# Patient Record
Sex: Female | Born: 1940 | Race: Black or African American | Hispanic: No | Marital: Married | Smoking: Never smoker
Health system: Southern US, Community
[De-identification: ages and names within clinical notes are randomized; demographics above are authoritative.]

## PROBLEM LIST (undated history)

## (undated) DIAGNOSIS — G4730 Sleep apnea, unspecified: Secondary | ICD-10-CM

## (undated) DIAGNOSIS — D860 Sarcoidosis of lung: Secondary | ICD-10-CM

## (undated) DIAGNOSIS — Z87442 Personal history of urinary calculi: Secondary | ICD-10-CM

## (undated) DIAGNOSIS — I10 Essential (primary) hypertension: Secondary | ICD-10-CM

## (undated) DIAGNOSIS — E785 Hyperlipidemia, unspecified: Secondary | ICD-10-CM

## (undated) HISTORY — DX: Hyperlipidemia, unspecified: E78.5

## (undated) HISTORY — PX: BREAST EXCISIONAL BIOPSY: SUR124

## (undated) HISTORY — PX: TONSILLECTOMY: SUR1361

## (undated) HISTORY — DX: Essential (primary) hypertension: I10

---

## 1997-06-27 ENCOUNTER — Ambulatory Visit (HOSPITAL_COMMUNITY): Admission: RE | Admit: 1997-06-27 | Discharge: 1997-06-27 | Payer: Self-pay | Admitting: Internal Medicine

## 1997-08-08 ENCOUNTER — Ambulatory Visit (HOSPITAL_COMMUNITY): Admission: RE | Admit: 1997-08-08 | Discharge: 1997-08-08 | Payer: Self-pay | Admitting: Internal Medicine

## 1997-11-26 ENCOUNTER — Ambulatory Visit (HOSPITAL_COMMUNITY): Admission: RE | Admit: 1997-11-26 | Discharge: 1997-11-26 | Payer: Self-pay | Admitting: Internal Medicine

## 1997-12-20 ENCOUNTER — Other Ambulatory Visit: Admission: RE | Admit: 1997-12-20 | Discharge: 1997-12-20 | Payer: Self-pay | Admitting: Obstetrics and Gynecology

## 1998-01-25 ENCOUNTER — Emergency Department (HOSPITAL_COMMUNITY): Admission: EM | Admit: 1998-01-25 | Discharge: 1998-01-25 | Payer: Self-pay | Admitting: Emergency Medicine

## 1998-01-25 ENCOUNTER — Encounter: Payer: Self-pay | Admitting: Emergency Medicine

## 1998-12-31 ENCOUNTER — Other Ambulatory Visit: Admission: RE | Admit: 1998-12-31 | Discharge: 1998-12-31 | Payer: Self-pay | Admitting: Obstetrics and Gynecology

## 1999-05-27 ENCOUNTER — Ambulatory Visit (HOSPITAL_COMMUNITY): Admission: RE | Admit: 1999-05-27 | Discharge: 1999-05-27 | Payer: Self-pay | Admitting: Gastroenterology

## 2000-01-13 ENCOUNTER — Other Ambulatory Visit: Admission: RE | Admit: 2000-01-13 | Discharge: 2000-01-13 | Payer: Self-pay | Admitting: Obstetrics and Gynecology

## 2001-02-14 ENCOUNTER — Other Ambulatory Visit: Admission: RE | Admit: 2001-02-14 | Discharge: 2001-02-14 | Payer: Self-pay | Admitting: Obstetrics and Gynecology

## 2002-02-20 ENCOUNTER — Other Ambulatory Visit: Admission: RE | Admit: 2002-02-20 | Discharge: 2002-02-20 | Payer: Self-pay | Admitting: Obstetrics and Gynecology

## 2004-05-06 ENCOUNTER — Other Ambulatory Visit: Admission: RE | Admit: 2004-05-06 | Discharge: 2004-05-06 | Payer: Self-pay | Admitting: Obstetrics and Gynecology

## 2005-05-11 ENCOUNTER — Other Ambulatory Visit: Admission: RE | Admit: 2005-05-11 | Discharge: 2005-05-11 | Payer: Self-pay | Admitting: Obstetrics and Gynecology

## 2013-03-20 ENCOUNTER — Ambulatory Visit
Admission: RE | Admit: 2013-03-20 | Discharge: 2013-03-20 | Disposition: A | Discharge status: Home / Self Care | Payer: BC Managed Care – PPO | Source: Ambulatory Visit | Attending: Obstetrics and Gynecology | Admitting: Obstetrics and Gynecology

## 2013-03-20 ENCOUNTER — Other Ambulatory Visit: Payer: Self-pay | Admitting: Obstetrics and Gynecology

## 2013-03-20 DIAGNOSIS — M810 Age-related osteoporosis without current pathological fracture: Secondary | ICD-10-CM

## 2018-10-26 ENCOUNTER — Other Ambulatory Visit: Payer: Self-pay | Admitting: Cardiology

## 2018-10-26 DIAGNOSIS — Z20822 Contact with and (suspected) exposure to covid-19: Secondary | ICD-10-CM

## 2018-10-28 LAB — NOVEL CORONAVIRUS, NAA: SARS-CoV-2, NAA: NOT DETECTED

## 2018-12-07 ENCOUNTER — Other Ambulatory Visit: Payer: Self-pay | Admitting: Cardiology

## 2018-12-07 DIAGNOSIS — Z20822 Contact with and (suspected) exposure to covid-19: Secondary | ICD-10-CM

## 2018-12-10 LAB — NOVEL CORONAVIRUS, NAA: SARS-CoV-2, NAA: NOT DETECTED

## 2019-05-05 ENCOUNTER — Encounter (HOSPITAL_COMMUNITY): Payer: Self-pay

## 2019-05-05 ENCOUNTER — Emergency Department (HOSPITAL_COMMUNITY): Payer: Medicare Other

## 2019-05-05 ENCOUNTER — Other Ambulatory Visit: Payer: Self-pay

## 2019-05-05 ENCOUNTER — Inpatient Hospital Stay (HOSPITAL_COMMUNITY)
Admission: EM | Admit: 2019-05-05 | Discharge: 2019-05-10 | DRG: 854 | Disposition: A | Discharge status: Home Health | Payer: Medicare Other | Attending: Internal Medicine | Admitting: Internal Medicine

## 2019-05-05 DIAGNOSIS — N10 Acute tubulo-interstitial nephritis: Secondary | ICD-10-CM | POA: Diagnosis present

## 2019-05-05 DIAGNOSIS — Z713 Dietary counseling and surveillance: Secondary | ICD-10-CM

## 2019-05-05 DIAGNOSIS — Z79899 Other long term (current) drug therapy: Secondary | ICD-10-CM

## 2019-05-05 DIAGNOSIS — E872 Acidosis, unspecified: Secondary | ICD-10-CM | POA: Diagnosis present

## 2019-05-05 DIAGNOSIS — N136 Pyonephrosis: Secondary | ICD-10-CM | POA: Diagnosis present

## 2019-05-05 DIAGNOSIS — Z7984 Long term (current) use of oral hypoglycemic drugs: Secondary | ICD-10-CM

## 2019-05-05 DIAGNOSIS — A419 Sepsis, unspecified organism: Secondary | ICD-10-CM

## 2019-05-05 DIAGNOSIS — A4150 Gram-negative sepsis, unspecified: Secondary | ICD-10-CM | POA: Diagnosis not present

## 2019-05-05 DIAGNOSIS — D860 Sarcoidosis of lung: Secondary | ICD-10-CM | POA: Diagnosis present

## 2019-05-05 DIAGNOSIS — E669 Obesity, unspecified: Secondary | ICD-10-CM | POA: Diagnosis present

## 2019-05-05 DIAGNOSIS — N281 Cyst of kidney, acquired: Secondary | ICD-10-CM | POA: Diagnosis present

## 2019-05-05 DIAGNOSIS — N390 Urinary tract infection, site not specified: Secondary | ICD-10-CM | POA: Diagnosis present

## 2019-05-05 DIAGNOSIS — R7401 Elevation of levels of liver transaminase levels: Secondary | ICD-10-CM | POA: Diagnosis present

## 2019-05-05 DIAGNOSIS — R7881 Bacteremia: Secondary | ICD-10-CM | POA: Diagnosis present

## 2019-05-05 DIAGNOSIS — E8770 Fluid overload, unspecified: Secondary | ICD-10-CM | POA: Diagnosis not present

## 2019-05-05 DIAGNOSIS — R6520 Severe sepsis without septic shock: Secondary | ICD-10-CM | POA: Diagnosis present

## 2019-05-05 DIAGNOSIS — K130 Diseases of lips: Secondary | ICD-10-CM | POA: Diagnosis not present

## 2019-05-05 DIAGNOSIS — E538 Deficiency of other specified B group vitamins: Secondary | ICD-10-CM | POA: Diagnosis present

## 2019-05-05 DIAGNOSIS — Z6839 Body mass index (BMI) 39.0-39.9, adult: Secondary | ICD-10-CM

## 2019-05-05 DIAGNOSIS — N1330 Unspecified hydronephrosis: Secondary | ICD-10-CM | POA: Diagnosis present

## 2019-05-05 DIAGNOSIS — I10 Essential (primary) hypertension: Secondary | ICD-10-CM | POA: Diagnosis present

## 2019-05-05 DIAGNOSIS — N201 Calculus of ureter: Secondary | ICD-10-CM | POA: Diagnosis present

## 2019-05-05 DIAGNOSIS — Z20822 Contact with and (suspected) exposure to covid-19: Secondary | ICD-10-CM | POA: Diagnosis present

## 2019-05-05 DIAGNOSIS — N179 Acute kidney failure, unspecified: Secondary | ICD-10-CM | POA: Diagnosis present

## 2019-05-05 DIAGNOSIS — D509 Iron deficiency anemia, unspecified: Secondary | ICD-10-CM | POA: Diagnosis present

## 2019-05-05 DIAGNOSIS — E876 Hypokalemia: Secondary | ICD-10-CM | POA: Diagnosis present

## 2019-05-05 DIAGNOSIS — E860 Dehydration: Secondary | ICD-10-CM | POA: Diagnosis present

## 2019-05-05 DIAGNOSIS — B9620 Unspecified Escherichia coli [E. coli] as the cause of diseases classified elsewhere: Secondary | ICD-10-CM | POA: Diagnosis present

## 2019-05-05 DIAGNOSIS — E1165 Type 2 diabetes mellitus with hyperglycemia: Secondary | ICD-10-CM | POA: Diagnosis present

## 2019-05-05 DIAGNOSIS — D3501 Benign neoplasm of right adrenal gland: Secondary | ICD-10-CM | POA: Diagnosis present

## 2019-05-05 HISTORY — DX: Sarcoidosis of lung: D86.0

## 2019-05-05 LAB — CBC WITH DIFFERENTIAL/PLATELET
Abs Immature Granulocytes: 0 10*3/uL (ref 0.00–0.07)
Band Neutrophils: 12 %
Basophils Absolute: 0 10*3/uL (ref 0.0–0.1)
Basophils Relative: 0 %
Eosinophils Absolute: 0.1 10*3/uL (ref 0.0–0.5)
Eosinophils Relative: 3 %
HCT: 31.9 % — ABNORMAL LOW (ref 36.0–46.0)
Hemoglobin: 10.4 g/dL — ABNORMAL LOW (ref 12.0–15.0)
Lymphocytes Relative: 5 %
Lymphs Abs: 0.2 10*3/uL — ABNORMAL LOW (ref 0.7–4.0)
MCH: 28.8 pg (ref 26.0–34.0)
MCHC: 32.6 g/dL (ref 30.0–36.0)
MCV: 88.4 fL (ref 80.0–100.0)
Monocytes Absolute: 0 10*3/uL — ABNORMAL LOW (ref 0.1–1.0)
Monocytes Relative: 1 %
Neutro Abs: 3.5 10*3/uL (ref 1.7–7.7)
Neutrophils Relative %: 79 %
Platelets: 295 10*3/uL (ref 150–400)
RBC: 3.61 MIL/uL — ABNORMAL LOW (ref 3.87–5.11)
RDW: 14.4 % (ref 11.5–15.5)
WBC: 3.9 10*3/uL — ABNORMAL LOW (ref 4.0–10.5)
nRBC: 0 % (ref 0.0–0.2)

## 2019-05-05 LAB — PROTIME-INR
INR: 1.1 (ref 0.8–1.2)
Prothrombin Time: 13.6 seconds (ref 11.4–15.2)

## 2019-05-05 LAB — COMPREHENSIVE METABOLIC PANEL
ALT: 60 U/L — ABNORMAL HIGH (ref 0–44)
AST: 117 U/L — ABNORMAL HIGH (ref 15–41)
Albumin: 3.8 g/dL (ref 3.5–5.0)
Alkaline Phosphatase: 102 U/L (ref 38–126)
Anion gap: 13 (ref 5–15)
BUN: 40 mg/dL — ABNORMAL HIGH (ref 8–23)
CO2: 20 mmol/L — ABNORMAL LOW (ref 22–32)
Calcium: 9.4 mg/dL (ref 8.9–10.3)
Chloride: 103 mmol/L (ref 98–111)
Creatinine, Ser: 2.64 mg/dL — ABNORMAL HIGH (ref 0.44–1.00)
GFR calc Af Amer: 20 mL/min — ABNORMAL LOW (ref >60)
GFR calc non Af Amer: 17 mL/min — ABNORMAL LOW (ref >60)
Glucose, Bld: 253 mg/dL — ABNORMAL HIGH (ref 70–99)
Potassium: 3.4 mmol/L — ABNORMAL LOW (ref 3.5–5.1)
Sodium: 136 mmol/L (ref 135–145)
Total Bilirubin: 0.7 mg/dL (ref 0.3–1.2)
Total Protein: 6.5 g/dL (ref 6.5–8.1)

## 2019-05-05 LAB — APTT: aPTT: 27 seconds (ref 24–36)

## 2019-05-05 LAB — LACTIC ACID, PLASMA: Lactic Acid, Venous: 6.3 mmol/L (ref 0.5–1.9)

## 2019-05-05 MED ORDER — ACETAMINOPHEN 500 MG PO TABS
1000.0000 mg | ORAL_TABLET | Freq: Once | ORAL | Status: AC
  Administered 2019-05-05: 1000 mg via ORAL
  Filled 2019-05-05: qty 2

## 2019-05-05 MED ORDER — SODIUM CHLORIDE 0.9 % IV BOLUS
1000.0000 mL | Freq: Once | INTRAVENOUS | Status: AC
  Administered 2019-05-05: 1000 mL via INTRAVENOUS

## 2019-05-05 MED ORDER — SODIUM CHLORIDE 0.9 % IV SOLN
500.0000 mg | INTRAVENOUS | Status: DC
  Administered 2019-05-05: 500 mg via INTRAVENOUS
  Filled 2019-05-05: qty 500

## 2019-05-05 MED ORDER — SODIUM CHLORIDE 0.9 % IV SOLN
2.0000 g | INTRAVENOUS | Status: DC
  Administered 2019-05-05: 2 g via INTRAVENOUS
  Filled 2019-05-05: qty 20

## 2019-05-05 MED ORDER — SODIUM CHLORIDE 0.9 % IV BOLUS: 1000.0000 mL | Freq: Once | INTRAVENOUS | Status: DC

## 2019-05-05 MED ORDER — SODIUM CHLORIDE 0.9 % IV BOLUS
2000.0000 mL | Freq: Once | INTRAVENOUS | Status: AC
  Administered 2019-05-05: 2000 mL via INTRAVENOUS

## 2019-05-05 NOTE — ED Triage Notes (Signed)
Per EMS, Pt is coming from home. Pt called out today when she began having lethargy, and chills. 102.0 temp on scene, pt took 4x baby Asprin at 2012. Denies any pain, and being around anyone who is sick. Pt has also had both covid vaccines.

## 2019-05-05 NOTE — ED Provider Notes (Signed)
[facility] DEPT Provider Note   CSN: [id number] Arrival date & time: 05/05/19  2114     History Chief Complaint  Patient presents with  . Fever    Diane Wilkerson is a 79 y.o. female.  The history is provided by the patient.  Illness Location:  General Quality:  Fatigue Severity:  Mild Onset quality:  Gradual Timing:  Constant Progression:  Unchanged Chronicity:  New Context:  Fatigue all day today. Cough but mostly that is chronic. Fever with EMS. Denies pain. Covid vaccinated already. No abdominal pain, throat pain, UTI symtpms. Relieved by:  Nothing Worsened by:  Nothing Associated symptoms: fatigue and fever   Associated symptoms: no abdominal pain, no chest pain, no congestion, no cough, no ear pain, no rash, no shortness of breath, no sore throat and no vomiting        History reviewed. No pertinent past medical history.  There are no problems to display for this patient.   History reviewed. No pertinent surgical history.   OB History   No obstetric history on file.     No family history on file.  Social History   Tobacco Use  . Smoking status: Not on file  Substance Use Topics  . Alcohol use: Not on file  . Drug use: Not on file    Home Medications Prior to Admission medications   Medication Sig Start Date End Date Taking? Authorizing Provider  glipizide-metformin (METAGLIP) 2.5-250 MG tablet Take 1 tablet by mouth 2 (two) times daily. 04/21/19  Yes [provider]  losartan-hydrochlorothiazide (HYZAAR) 100-25 MG tablet Take 1 tablet by mouth daily. 03/26/19  Yes [provider]    Allergies    Patient has no known allergies.  Review of Systems   Review of Systems  Constitutional: Positive for fatigue and fever. Negative for chills.  HENT: Negative for congestion, ear pain and sore throat.   Eyes: Negative for pain and visual disturbance.  Respiratory: Negative for cough and shortness of  breath.   Cardiovascular: Negative for chest pain and palpitations.  Gastrointestinal: Negative for abdominal pain and vomiting.  Genitourinary: Negative for dysuria and hematuria.  Musculoskeletal: Negative for arthralgias and back pain.  Skin: Negative for color change and rash.  Neurological: Negative for seizures and syncope.  All other systems reviewed and are negative.   Physical Exam Updated Vital Signs  ED Triage Vitals  Enc Vitals Group     BP 05/05/19 2133 131/64     Pulse Rate 05/05/19 2133 (!) 137     Resp 05/05/19 2133 (!) 28     Temp 05/05/19 2133 (!) 103.1 F (39.5 C)     Temp Source 05/05/19 2133 Oral     SpO2 05/05/19 2127 97 %     Weight 05/05/19 2134 190 lb (86.2 kg)     Height 05/05/19 2134 5\' 2"  (1.575 m)     Head Circumference --      Peak Flow --      Pain Score 05/05/19 2133 3     Pain Loc --      Pain Edu? --      Excl. in [place]? --     Physical Exam Vitals and nursing note reviewed.  Constitutional:      General: She is not in acute distress.    Appearance: She is well-developed. She is ill-appearing.  HENT:     Head: Normocephalic and atraumatic.     Mouth/Throat:     Mouth:  Mucous membranes are moist.  Eyes:     Extraocular Movements: Extraocular movements intact.     Conjunctiva/sclera: Conjunctivae normal.     Pupils: Pupils are equal, round, and reactive to light.  Cardiovascular:     Rate and Rhythm: Regular rhythm. Tachycardia present.     Pulses: Normal pulses.     Heart sounds: Normal heart sounds. No murmur.  Pulmonary:     Effort: Pulmonary effort is normal. No respiratory distress.     Breath sounds: Normal breath sounds.  Abdominal:     General: Abdomen is flat. There is no distension.     Palpations: Abdomen is soft.     Tenderness: There is no abdominal tenderness.  Musculoskeletal:     Cervical back: Neck supple.  Skin:    General: Skin is warm and dry.     Capillary Refill: Capillary refill takes less than 2 seconds.    Neurological:     General: No focal deficit present.     Mental Status: She is alert.     ED Results / Procedures / Treatments   Labs (all labs ordered are listed, but only abnormal results are displayed) Labs Reviewed  COMPREHENSIVE METABOLIC PANEL - Abnormal; Notable for the following components:      Result Value   Potassium 3.4 (*)    CO2 20 (*)    Glucose, Bld 253 (*)    BUN 40 (*)    Creatinine, Ser 2.64 (*)    AST 117 (*)    ALT 60 (*)    GFR calc non Af Amer 17 (*)    GFR calc Af Amer 20 (*)    All other components within normal limits  LACTIC ACID, PLASMA - Abnormal; Notable for the following components:   Lactic Acid, Venous 6.3 (*)    All other components within normal limits  CBC WITH DIFFERENTIAL/PLATELET - Abnormal; Notable for the following components:   WBC 3.9 (*)    RBC 3.61 (*)    Hemoglobin 10.4 (*)    HCT 31.9 (*)    Lymphs Abs 0.2 (*)    Monocytes Absolute 0.0 (*)    All other components within normal limits  CULTURE, BLOOD (ROUTINE X 2)  CULTURE, BLOOD (ROUTINE X 2)  URINE CULTURE  RESPIRATORY PANEL BY RT PCR (FLU A&B, COVID)  PROTIME-INR  APTT  LACTIC ACID, PLASMA  URINALYSIS, ROUTINE W REFLEX MICROSCOPIC    EKG EKG Interpretation  Date/Time:  Saturday May 05 2019 21:30:24 EDT Ventricular Rate:  139 PR Interval:    QRS Duration: 73 QT Interval:  299 QTC Calculation: 455 R Axis:   69 Text Interpretation: Sinus tachycardia Low voltage, precordial leads Confirmed by Lennice Sites [phone]) on 05/05/2019 10:47:12 PM   Radiology DG Chest 2 View  Result Date: 05/05/2019 CLINICAL DATA:  Lethargy and chills. EXAM: CHEST - 2 VIEW COMPARISON:  None. FINDINGS: Very mild, diffusely increased lung markings are seen without evidence of acute infiltrate, pleural effusion or pneumothorax. The heart size and mediastinal contours are within normal limits. Very mild prominence of the right hilum is seen. The visualized skeletal structures are  unremarkable. IMPRESSION: 1. Very mild, diffusely increased lung markings which are likely chronic in nature. 2. Mild prominence of the right hilum which may be vascular in origin. Mild lymphadenopathy cannot be excluded. Electronically Signed   By: Virgina Norfolk M.D.   On: 05/05/2019 22:32    Procedures .Critical Care Performed by: Lennice Sites, DO Authorized by: Lennice Sites,  DO   Critical care provider statement:    Critical care time (minutes):  35   Critical care was necessary to treat or prevent imminent or life-threatening deterioration of the following conditions:  Sepsis   Critical care was time spent personally by me on the following activities:  Blood draw for specimens, discussions with primary provider, evaluation of patient's response to treatment, development of treatment plan with patient or surrogate, examination of patient, obtaining history from patient or surrogate, ordering and performing treatments and interventions, ordering and review of laboratory studies, ordering and review of radiographic studies, pulse oximetry, re-evaluation of patient's condition and review of old charts   I assumed direction of critical care for this patient from another provider in my specialty: no     (including critical care time)  Medications Ordered in ED Medications  cefTRIAXone (ROCEPHIN) 2 g in sodium chloride 0.9 % 100 mL IVPB (0 g Intravenous Stopped 05/05/19 2314)  azithromycin (ZITHROMAX) 500 mg in sodium chloride 0.9 % 250 mL IVPB (500 mg Intravenous New Bag/Given 05/05/19 2314)  sodium chloride 0.9 % bolus 1,000 mL (has no administration in time range)  acetaminophen (TYLENOL) tablet 1,000 mg (1,000 mg Oral Given 05/05/19 2217)  sodium chloride 0.9 % bolus 2,000 mL (2,000 mLs Intravenous New Bag/Given 05/05/19 2219)    ED Course  I have reviewed the triage vital signs and the nursing notes.  Pertinent labs & imaging results that were available during my care of the patient  were reviewed by me and considered in my medical decision making (see chart for details).    MDM Rules/Calculators/A&P                      Diane Wilkerson is a 79 year old female with no significant medical history presents the ED with fatigue.  Patient found to be febrile and tachycardic upon arrival.  Code sepsis initiated.  She has had a cough but denies any other specific symptoms.  No pain with urination, no abdominal pain, no shortness of breath, no chest pain.  Has been vaccinated for coronavirus.  Will give IV fluids, Tylenol, IV antibiotics and evaluate for sepsis.  Patient with creatinine of 2.64, lactic acid of 6.3.  White count of 3.9.  Otherwise lab work is fairly unremarkable.  He does not show obvious infection.  Awaiting urinalysis.  30 cc/kg IV fluids have now been ordered given lactic acid of 6.3.  Will obtain a CT scan of the chest abdomen pelvis to further evaluate.  Patient signed out to oncoming ED staff with patient pending imaging, urinalysis.  Patient otherwise improving hemodynamically following IV fluids.  Please see Dr. Vevelyn Francois note for further results, evaluation, disposition of the patient.  This chart was dictated using voice recognition software.  Despite best efforts to proofread,  errors can occur which can change the documentation meaning.     Final Clinical Impression(s) / ED Diagnoses Final diagnoses:  Sepsis, due to unspecified organism, unspecified whether acute organ dysfunction present [facility])  AKI (acute kidney injury) [facility])    Rx / [facility] Orders ED Discharge Orders    None       Lennice Sites, DO 05/05/19 2338

## 2019-05-06 ENCOUNTER — Encounter (HOSPITAL_COMMUNITY): Payer: Self-pay | Admitting: Internal Medicine

## 2019-05-06 ENCOUNTER — Inpatient Hospital Stay (HOSPITAL_COMMUNITY): Payer: Medicare Other | Admitting: Certified Registered"

## 2019-05-06 ENCOUNTER — Encounter (HOSPITAL_COMMUNITY)
Admission: EM | Disposition: A | Discharge status: Home Health | Payer: Self-pay | Source: Home / Self Care | Attending: Internal Medicine

## 2019-05-06 ENCOUNTER — Inpatient Hospital Stay (HOSPITAL_COMMUNITY): Payer: Medicare Other

## 2019-05-06 DIAGNOSIS — D860 Sarcoidosis of lung: Secondary | ICD-10-CM | POA: Diagnosis present

## 2019-05-06 DIAGNOSIS — D509 Iron deficiency anemia, unspecified: Secondary | ICD-10-CM | POA: Diagnosis not present

## 2019-05-06 DIAGNOSIS — N179 Acute kidney failure, unspecified: Secondary | ICD-10-CM | POA: Diagnosis not present

## 2019-05-06 DIAGNOSIS — E876 Hypokalemia: Secondary | ICD-10-CM | POA: Diagnosis present

## 2019-05-06 DIAGNOSIS — D3501 Benign neoplasm of right adrenal gland: Secondary | ICD-10-CM | POA: Diagnosis present

## 2019-05-06 DIAGNOSIS — R6520 Severe sepsis without septic shock: Secondary | ICD-10-CM

## 2019-05-06 DIAGNOSIS — A419 Sepsis, unspecified organism: Secondary | ICD-10-CM

## 2019-05-06 DIAGNOSIS — R7401 Elevation of levels of liver transaminase levels: Secondary | ICD-10-CM | POA: Diagnosis not present

## 2019-05-06 DIAGNOSIS — E872 Acidosis, unspecified: Secondary | ICD-10-CM | POA: Diagnosis present

## 2019-05-06 DIAGNOSIS — E860 Dehydration: Secondary | ICD-10-CM | POA: Diagnosis present

## 2019-05-06 DIAGNOSIS — E669 Obesity, unspecified: Secondary | ICD-10-CM | POA: Diagnosis not present

## 2019-05-06 DIAGNOSIS — E1165 Type 2 diabetes mellitus with hyperglycemia: Secondary | ICD-10-CM

## 2019-05-06 DIAGNOSIS — N201 Calculus of ureter: Secondary | ICD-10-CM | POA: Diagnosis present

## 2019-05-06 DIAGNOSIS — I10 Essential (primary) hypertension: Secondary | ICD-10-CM | POA: Diagnosis present

## 2019-05-06 DIAGNOSIS — Z713 Dietary counseling and surveillance: Secondary | ICD-10-CM | POA: Diagnosis not present

## 2019-05-06 DIAGNOSIS — N10 Acute tubulo-interstitial nephritis: Secondary | ICD-10-CM | POA: Diagnosis not present

## 2019-05-06 DIAGNOSIS — A4150 Gram-negative sepsis, unspecified: Secondary | ICD-10-CM | POA: Diagnosis present

## 2019-05-06 DIAGNOSIS — E538 Deficiency of other specified B group vitamins: Secondary | ICD-10-CM | POA: Diagnosis not present

## 2019-05-06 DIAGNOSIS — Z7984 Long term (current) use of oral hypoglycemic drugs: Secondary | ICD-10-CM | POA: Diagnosis not present

## 2019-05-06 DIAGNOSIS — Z6839 Body mass index (BMI) 39.0-39.9, adult: Secondary | ICD-10-CM | POA: Diagnosis not present

## 2019-05-06 DIAGNOSIS — N136 Pyonephrosis: Secondary | ICD-10-CM | POA: Diagnosis not present

## 2019-05-06 DIAGNOSIS — E8770 Fluid overload, unspecified: Secondary | ICD-10-CM | POA: Diagnosis not present

## 2019-05-06 DIAGNOSIS — Z20822 Contact with and (suspected) exposure to covid-19: Secondary | ICD-10-CM | POA: Diagnosis not present

## 2019-05-06 DIAGNOSIS — K130 Diseases of lips: Secondary | ICD-10-CM | POA: Diagnosis not present

## 2019-05-06 DIAGNOSIS — N281 Cyst of kidney, acquired: Secondary | ICD-10-CM | POA: Diagnosis present

## 2019-05-06 DIAGNOSIS — Z79899 Other long term (current) drug therapy: Secondary | ICD-10-CM | POA: Diagnosis not present

## 2019-05-06 HISTORY — DX: Type 2 diabetes mellitus with hyperglycemia: E11.65

## 2019-05-06 HISTORY — PX: CYSTOSCOPY WITH STENT PLACEMENT: SHX5790

## 2019-05-06 HISTORY — DX: Essential (primary) hypertension: I10

## 2019-05-06 LAB — MRSA PCR SCREENING: MRSA by PCR: NEGATIVE

## 2019-05-06 LAB — BLOOD CULTURE ID PANEL (REFLEXED)

## 2019-05-06 LAB — URINALYSIS, ROUTINE W REFLEX MICROSCOPIC
Bilirubin Urine: NEGATIVE
Glucose, UA: NEGATIVE mg/dL
Ketones, ur: 5 mg/dL — AB
Nitrite: NEGATIVE
Protein, ur: 100 mg/dL — AB
RBC / HPF: >50 RBC/hpf — ABNORMAL HIGH (ref 0–5)
Specific Gravity, Urine: 1.015 (ref 1.005–1.030)
pH: 5 (ref 5.0–8.0)

## 2019-05-06 LAB — CBC WITH DIFFERENTIAL/PLATELET
Abs Immature Granulocytes: 0.39 10*3/uL — ABNORMAL HIGH (ref 0.00–0.07)
Basophils Absolute: 0 10*3/uL (ref 0.0–0.1)
Basophils Relative: 0 %
Eosinophils Absolute: 0 10*3/uL (ref 0.0–0.5)
Eosinophils Relative: 0 %
HCT: 28.7 % — ABNORMAL LOW (ref 36.0–46.0)
Hemoglobin: 8.9 g/dL — ABNORMAL LOW (ref 12.0–15.0)
Immature Granulocytes: 2 %
Lymphocytes Relative: 2 %
Lymphs Abs: 0.4 10*3/uL — ABNORMAL LOW (ref 0.7–4.0)
MCH: 28.2 pg (ref 26.0–34.0)
MCHC: 31 g/dL (ref 30.0–36.0)
MCV: 90.8 fL (ref 80.0–100.0)
Monocytes Absolute: 1 10*3/uL (ref 0.1–1.0)
Monocytes Relative: 6 %
Neutro Abs: 15.1 10*3/uL — ABNORMAL HIGH (ref 1.7–7.7)
Neutrophils Relative %: 90 %
Platelets: 239 10*3/uL (ref 150–400)
RBC: 3.16 MIL/uL — ABNORMAL LOW (ref 3.87–5.11)
RDW: 14.7 % (ref 11.5–15.5)
WBC: 16.9 10*3/uL — ABNORMAL HIGH (ref 4.0–10.5)
nRBC: 0 % (ref 0.0–0.2)

## 2019-05-06 LAB — RETICULOCYTES
Immature Retic Fract: 14.4 % (ref 2.3–15.9)
RBC.: 3.08 MIL/uL — ABNORMAL LOW (ref 3.87–5.11)
Retic Count, Absolute: 47.1 10*3/uL (ref 19.0–186.0)
Retic Ct Pct: 1.5 % (ref 0.4–3.1)

## 2019-05-06 LAB — MAGNESIUM: Magnesium: 1.4 mg/dL — ABNORMAL LOW (ref 1.7–2.4)

## 2019-05-06 LAB — COMPREHENSIVE METABOLIC PANEL
ALT: 60 U/L — ABNORMAL HIGH (ref 0–44)
AST: 95 U/L — ABNORMAL HIGH (ref 15–41)
Albumin: 3.2 g/dL — ABNORMAL LOW (ref 3.5–5.0)
Alkaline Phosphatase: 53 U/L (ref 38–126)
Anion gap: 8 (ref 5–15)
BUN: 41 mg/dL — ABNORMAL HIGH (ref 8–23)
CO2: 22 mmol/L (ref 22–32)
Calcium: 8.3 mg/dL — ABNORMAL LOW (ref 8.9–10.3)
Chloride: 112 mmol/L — ABNORMAL HIGH (ref 98–111)
Creatinine, Ser: 2.67 mg/dL — ABNORMAL HIGH (ref 0.44–1.00)
GFR calc Af Amer: 19 mL/min — ABNORMAL LOW (ref >60)
GFR calc non Af Amer: 17 mL/min — ABNORMAL LOW (ref >60)
Glucose, Bld: 167 mg/dL — ABNORMAL HIGH (ref 70–99)
Potassium: 4 mmol/L (ref 3.5–5.1)
Sodium: 142 mmol/L (ref 135–145)
Total Bilirubin: 0.6 mg/dL (ref 0.3–1.2)
Total Protein: 5.8 g/dL — ABNORMAL LOW (ref 6.5–8.1)

## 2019-05-06 LAB — LACTIC ACID, PLASMA
Lactic Acid, Venous: 3.4 mmol/L (ref 0.5–1.9)
Lactic Acid, Venous: 5.3 mmol/L (ref 0.5–1.9)

## 2019-05-06 LAB — FERRITIN: Ferritin: 183 ng/mL (ref 11–307)

## 2019-05-06 LAB — GLUCOSE, CAPILLARY
Glucose-Capillary: 156 mg/dL — ABNORMAL HIGH (ref 70–99)
Glucose-Capillary: 146 mg/dL — ABNORMAL HIGH (ref 70–99)
Glucose-Capillary: 149 mg/dL — ABNORMAL HIGH (ref 70–99)
Glucose-Capillary: 161 mg/dL — ABNORMAL HIGH (ref 70–99)
Glucose-Capillary: 125 mg/dL — ABNORMAL HIGH (ref 70–99)

## 2019-05-06 LAB — IRON AND TIBC
Iron: 7 ug/dL — ABNORMAL LOW (ref 28–170)
Saturation Ratios: 3 % — ABNORMAL LOW (ref 10.4–31.8)
TIBC: 261 ug/dL (ref 250–450)
UIBC: 254 ug/dL

## 2019-05-06 LAB — HEMOGLOBIN A1C
Hgb A1c MFr Bld: 7.1 % — ABNORMAL HIGH (ref 4.8–5.6)
Mean Plasma Glucose: 157.07 mg/dL

## 2019-05-06 LAB — PROCALCITONIN: Procalcitonin: >150 ng/mL

## 2019-05-06 LAB — VITAMIN B12: Vitamin B-12: 539 pg/mL (ref 180–914)

## 2019-05-06 LAB — RESPIRATORY PANEL BY RT PCR (FLU A&B, COVID)
Influenza A by PCR: NEGATIVE
Influenza B by PCR: NEGATIVE
SARS Coronavirus 2 by RT PCR: NEGATIVE

## 2019-05-06 LAB — FOLATE: Folate: 5.1 ng/mL — ABNORMAL LOW (ref >5.9)

## 2019-05-06 SURGERY — CYSTOSCOPY, WITH STENT INSERTION
Anesthesia: General | Laterality: Left

## 2019-05-06 MED ORDER — TAMSULOSIN HCL 0.4 MG PO CAPS
0.4000 mg | ORAL_CAPSULE | Freq: Every day | ORAL | Status: DC
  Administered 2019-05-06 – 2019-05-10 (×5): 0.4 mg via ORAL
  Filled 2019-05-06 (×5): qty 1

## 2019-05-06 MED ORDER — IOHEXOL 300 MG/ML  SOLN
INTRAMUSCULAR | Status: DC | PRN
  Administered 2019-05-06: 50 mL via URETHRAL

## 2019-05-06 MED ORDER — PROPOFOL 10 MG/ML IV BOLUS
INTRAVENOUS | Status: DC | PRN
  Administered 2019-05-06: 30 mg via INTRAVENOUS
  Administered 2019-05-06: 70 mg via INTRAVENOUS

## 2019-05-06 MED ORDER — PHENYLEPHRINE HCL (PRESSORS) 10 MG/ML IV SOLN
INTRAVENOUS | Status: DC | PRN
  Administered 2019-05-06: 40 ug via INTRAVENOUS

## 2019-05-06 MED ORDER — ONDANSETRON HCL 4 MG/2ML IJ SOLN
INTRAMUSCULAR | Status: DC | PRN
  Administered 2019-05-06: 4 mg via INTRAVENOUS

## 2019-05-06 MED ORDER — POLYETHYLENE GLYCOL 3350 17 G PO PACK
17.0000 g | PACK | Freq: Every day | ORAL | Status: DC | PRN
  Filled 2019-05-06: qty 1

## 2019-05-06 MED ORDER — FENTANYL CITRATE (PF) 100 MCG/2ML IJ SOLN
INTRAMUSCULAR | Status: AC
  Filled 2019-05-06: qty 2

## 2019-05-06 MED ORDER — ENOXAPARIN SODIUM 40 MG/0.4ML ~~LOC~~ SOLN
40.0000 mg | SUBCUTANEOUS | Status: DC
  Filled 2019-05-06: qty 0.4

## 2019-05-06 MED ORDER — INSULIN ASPART 100 UNIT/ML ~~LOC~~ SOLN
SUBCUTANEOUS | Status: AC
  Filled 2019-05-06: qty 1

## 2019-05-06 MED ORDER — ONDANSETRON HCL 4 MG/2ML IJ SOLN: 4.0000 mg | Freq: Four times a day (QID) | INTRAMUSCULAR | Status: DC | PRN

## 2019-05-06 MED ORDER — SODIUM CHLORIDE 0.9 % IV SOLN
2.0000 g | INTRAVENOUS | Status: DC
  Administered 2019-05-06 – 2019-05-08 (×3): 2 g via INTRAVENOUS
  Filled 2019-05-06: qty 2
  Filled 2019-05-06: qty 20
  Filled 2019-05-06: qty 2
  Filled 2019-05-06: qty 20

## 2019-05-06 MED ORDER — MIDAZOLAM HCL 2 MG/2ML IJ SOLN
INTRAMUSCULAR | Status: DC | PRN
  Administered 2019-05-06: .5 mg via INTRAVENOUS

## 2019-05-06 MED ORDER — LACTATED RINGERS IV SOLN: INTRAVENOUS | Status: AC

## 2019-05-06 MED ORDER — ACETAMINOPHEN 160 MG/5ML PO SOLN: 1000.0000 mg | Freq: Once | ORAL | Status: DC | PRN

## 2019-05-06 MED ORDER — OXYBUTYNIN CHLORIDE 5 MG PO TABS
5.0000 mg | ORAL_TABLET | Freq: Three times a day (TID) | ORAL | Status: DC
  Administered 2019-05-06 – 2019-05-10 (×14): 5 mg via ORAL
  Filled 2019-05-06 (×14): qty 1

## 2019-05-06 MED ORDER — PIPERACILLIN-TAZOBACTAM 3.375 G IVPB 30 MIN
3.3750 g | Freq: Once | INTRAVENOUS | Status: AC
  Administered 2019-05-06: 3.375 g via INTRAVENOUS
  Filled 2019-05-06: qty 50

## 2019-05-06 MED ORDER — LIDOCAINE 2% (20 MG/ML) 5 ML SYRINGE
INTRAMUSCULAR | Status: DC | PRN
  Administered 2019-05-06: 80 mg via INTRAVENOUS

## 2019-05-06 MED ORDER — POTASSIUM CHLORIDE 2 MEQ/ML IV SOLN
INTRAVENOUS | Status: AC
  Filled 2019-05-06: qty 1000

## 2019-05-06 MED ORDER — PIPERACILLIN-TAZOBACTAM 3.375 G IVPB: 3.3750 g | Freq: Three times a day (TID) | INTRAVENOUS | Status: DC

## 2019-05-06 MED ORDER — LACTATED RINGERS IV SOLN: INTRAVENOUS | Status: DC

## 2019-05-06 MED ORDER — FENTANYL CITRATE (PF) 100 MCG/2ML IJ SOLN: 25.0000 ug | INTRAMUSCULAR | Status: DC | PRN

## 2019-05-06 MED ORDER — DEXAMETHASONE SODIUM PHOSPHATE 10 MG/ML IJ SOLN
INTRAMUSCULAR | Status: DC | PRN
Start: 2019-05-06 — End: 2019-05-06
  Administered 2019-05-06: 4 mg via INTRAVENOUS

## 2019-05-06 MED ORDER — MIDAZOLAM HCL 2 MG/2ML IJ SOLN
INTRAMUSCULAR | Status: AC
  Filled 2019-05-06: qty 2

## 2019-05-06 MED ORDER — FENTANYL CITRATE (PF) 100 MCG/2ML IJ SOLN
INTRAMUSCULAR | Status: DC | PRN
  Administered 2019-05-06 (×2): 50 ug via INTRAVENOUS

## 2019-05-06 MED ORDER — PROPOFOL 10 MG/ML IV BOLUS
INTRAVENOUS | Status: AC
  Filled 2019-05-06: qty 20

## 2019-05-06 MED ORDER — ONDANSETRON HCL 4 MG PO TABS
4.0000 mg | ORAL_TABLET | Freq: Four times a day (QID) | ORAL | Status: DC | PRN
  Filled 2019-05-06: qty 1

## 2019-05-06 MED ORDER — ENOXAPARIN SODIUM 30 MG/0.3ML ~~LOC~~ SOLN
30.0000 mg | SUBCUTANEOUS | Status: DC
  Administered 2019-05-06: 30 mg via SUBCUTANEOUS
  Filled 2019-05-06: qty 0.3

## 2019-05-06 MED ORDER — OXYBUTYNIN CHLORIDE 5 MG PO TABS
ORAL_TABLET | ORAL | Status: AC
  Filled 2019-05-06: qty 1

## 2019-05-06 MED ORDER — VANCOMYCIN HCL 1750 MG/350ML IV SOLN
1750.0000 mg | Freq: Once | INTRAVENOUS | Status: AC
  Administered 2019-05-06: 1750 mg via INTRAVENOUS
  Filled 2019-05-06 (×2): qty 350

## 2019-05-06 MED ORDER — SUCCINYLCHOLINE CHLORIDE 20 MG/ML IJ SOLN
INTRAMUSCULAR | Status: DC | PRN
  Administered 2019-05-06: 100 mg via INTRAVENOUS

## 2019-05-06 MED ORDER — HYDRALAZINE HCL 50 MG PO TABS: 50.0000 mg | ORAL_TABLET | Freq: Four times a day (QID) | ORAL | Status: DC | PRN

## 2019-05-06 MED ORDER — SODIUM CHLORIDE 0.9 % IV SOLN
INTRAVENOUS | Status: DC | PRN
Start: 2019-05-06 — End: 2019-05-06

## 2019-05-06 MED ORDER — CHLORHEXIDINE GLUCONATE CLOTH 2 % EX PADS
6.0000 | MEDICATED_PAD | Freq: Every day | CUTANEOUS | Status: DC
  Administered 2019-05-06 – 2019-05-09 (×4): 6 via TOPICAL

## 2019-05-06 MED ORDER — ACETAMINOPHEN 500 MG PO TABS: 1000.0000 mg | ORAL_TABLET | Freq: Once | ORAL | Status: DC | PRN

## 2019-05-06 MED ORDER — PHENYLEPHRINE HCL-NACL 10-0.9 MG/250ML-% IV SOLN
INTRAVENOUS | Status: DC | PRN
Start: 2019-05-06 — End: 2019-05-06
  Administered 2019-05-06: 50 ug/min via INTRAVENOUS

## 2019-05-06 MED ORDER — OXYCODONE HCL 5 MG/5ML PO SOLN: 5.0000 mg | Freq: Once | ORAL | Status: DC | PRN

## 2019-05-06 MED ORDER — MAGNESIUM SULFATE 4 GM/100ML IV SOLN
4.0000 g | Freq: Once | INTRAVENOUS | Status: AC
  Administered 2019-05-06: 4 g via INTRAVENOUS
  Filled 2019-05-06: qty 100

## 2019-05-06 MED ORDER — INSULIN ASPART 100 UNIT/ML ~~LOC~~ SOLN
0.0000 [IU] | Freq: Four times a day (QID) | SUBCUTANEOUS | Status: DC
  Administered 2019-05-06: 2 [IU] via SUBCUTANEOUS
  Administered 2019-05-06 (×2): 3 [IU] via SUBCUTANEOUS
  Administered 2019-05-06 – 2019-05-10 (×9): 2 [IU] via SUBCUTANEOUS

## 2019-05-06 MED ORDER — ACETAMINOPHEN 325 MG PO TABS: 650.0000 mg | ORAL_TABLET | Freq: Four times a day (QID) | ORAL | Status: DC | PRN

## 2019-05-06 MED ORDER — ACETAMINOPHEN 650 MG RE SUPP
650.0000 mg | Freq: Four times a day (QID) | RECTAL | Status: DC | PRN
  Filled 2019-05-06: qty 1

## 2019-05-06 MED ORDER — BELLADONNA ALKALOIDS-OPIUM 16.2-60 MG RE SUPP: 0.5000 | Freq: Four times a day (QID) | RECTAL | Status: DC | PRN

## 2019-05-06 MED ORDER — ACETAMINOPHEN 10 MG/ML IV SOLN: 1000.0000 mg | Freq: Once | INTRAVENOUS | Status: DC | PRN

## 2019-05-06 MED ORDER — OXYCODONE HCL 5 MG PO TABS: 5.0000 mg | ORAL_TABLET | Freq: Once | ORAL | Status: DC | PRN

## 2019-05-06 MED ORDER — STERILE WATER FOR IRRIGATION IR SOLN
Status: DC | PRN
  Administered 2019-05-06: 3000 mL

## 2019-05-06 SURGICAL SUPPLY — 20 items
BAG URO CATCHER STRL LF (MISCELLANEOUS) ×2 IMPLANT
BASKET ZERO TIP NITINOL 2.4FR (BASKET) ×2 IMPLANT
BSKT STON RTRVL ZERO TP 2.4FR (BASKET) ×1
CATH INTERMIT  6FR 70CM (CATHETERS) ×4 IMPLANT
CATH URET 5FR 28IN CONE TIP (BALLOONS) ×2
CATH URET 5FR 70CM CONE TIP (BALLOONS) ×1 IMPLANT
CLOTH BEACON ORANGE TIMEOUT ST (SAFETY) ×2 IMPLANT
GLOVE BIO SURGEON STRL SZ7.5 (GLOVE) ×2 IMPLANT
GOWN STRL REUS W/TWL XL LVL3 (GOWN DISPOSABLE) ×2 IMPLANT
GUIDEWIRE STR DUAL SENSOR (WIRE) ×2 IMPLANT
KIT TURNOVER KIT A (KITS) IMPLANT
MANIFOLD NEPTUNE II (INSTRUMENTS) ×2 IMPLANT
SHEATH URETERAL 12FRX28CM (UROLOGICAL SUPPLIES) ×2 IMPLANT
SHEATH URETERAL 12FRX35CM (MISCELLANEOUS) ×2 IMPLANT
STENT URET 6FRX24 CONTOUR (STENTS) ×1 IMPLANT
TRAY CYSTO PACK (CUSTOM PROCEDURE TRAY) ×2 IMPLANT
TRAY FOLEY MTR SLVR 16FR STAT (SET/KITS/TRAYS/PACK) ×1 IMPLANT
TUBING CONNECTING 10 (TUBING) ×2 IMPLANT
TUBING UROLOGY SET (TUBING) ×2 IMPLANT
WIRE COONS/BENSON .038X145CM (WIRE) ×2 IMPLANT

## 2019-05-06 NOTE — Op Note (Addendum)
PATIENT:  Diane Wilkerson  Preoperative diagnosis:  Left distal ureteral stone Left hydronephrosis Sepsis   Postoperative diagnosis:  Left distal ureteral stone   Procedure:  Cystoscopy Left basket extraction of ureteral stone Left retrograde pyelogram with interpretation Left ureteral stent placement (6Fr x 24cm) Simple Foley catheter placement  Surgeon: Dr. Festus Aloe, M.D. Assistant: Dr. Sharlot Gowda   Anesthesia: General  Complications: None  EBL: Minimal  Specimens: Urine for culture; from bladder after stent placement. Stone for analysis  Indication: Diane Wilkerson is a 79 y.o. female with history of sarcoidosis, diabetes, hypertension who presented with left flank discomfort and fever with a CT scan that demonstrated a 2-3 mm UVJ obstructing stone with associated left hydroureteronephrosis.   After reviewing the management options for treatment, they have elected to proceed with the above surgical procedure(s). We have discussed the potential benefits and risks of the procedure, side effects of the proposed treatment, the likelihood of the patient achieving the goals of the procedure, and any potential problems that might occur during the procedure or recuperation. Informed consent has been obtained.  Findings:  Normal urethra. Cystoscopy without concerns for overt cystitis or yeast.  No obvious bladder tumors, no stones within the bladder. Stone seen crowning from the left ureteral orifice Uncomplicated basket extraction of ureteral stone Left retrograde pyelogram from the level of the ureter vesicular junction which demonstrated mild hydronephrosis Uncomplicated placement of left 6 French by 24 cm JJ ureteral stent with string which was secured to Foley catheter  Description of procedure:   The patient was taken to the operating room and general anesthesia was induced.  The patient was placed in the dorsal lithotomy position, prepped and draped in the usual  sterile fashion, and preoperative antibiotics were administered. A preoperative time-out was performed.   Cystourethroscopy was performed.  The patient's urethra was examined and was normal. The bladder was then systematically examined in its entirety. There was no evidence for any bladder tumors, stones, or other mucosal pathology.  The stone was seen crowning from the left ureteral orifice.  Attention then turned to the left ureteral orifice a 0 tip nitinol basket was introduced to the ureteral orifice using the guidance of open-ended catheter.  The stone was successfully grasped and extracted in its entirety.  This was sent for analysis.  After this, there is a moderate hydronephrotic efflux.   Using open-ended catheter, the ureteral orifice then intubated.  Approximately 2 cc of Omnipaque contrast was injected through the urethral catheter and a retrograde pyelogram which revealed the above findings was obtained.  Mild hydronephrosis, no filling defects or signs of other stone burden.  A Sensor guidewire was then advanced up the left ureter into the renal pelvis under fluoroscopic guidance.  The wire was then backloaded through the cystoscope and a ureteral stent was advance over the wire.  The stent was positioned appropriately under fluoroscopic and cystoscopic guidance.  The wire was then removed with an adequate stent curl noted in the renal pelvis as well as in the bladder.  A Foley catheter was then placed in the bladder.  The ureteral stent string was secured to the Foley catheter tubing.  The patient appeared to tolerate the procedure well and without complications, although continued to have vital signs consistent with ongoing sepsis.  The patient was able to be awakened and transferred to the recovery unit in satisfactory condition.   Plan:  Transfer to stepdown Continue Foley catheter.  Do not remove Foley catheter  as it is affixed to the ureteral stent.  If clinically doing well in  several days, can discuss trial of void with ureteral stent removal at that time. Reasonable to start Flomax for stent discomfort Reasonable to start Ditropan or belladonna opium suppositories for bladder spasms Follow-up stone analysis Follow-up urine culture Continue broad-spectrum antibiotics and tailor pending urine culture results

## 2019-05-06 NOTE — Progress Notes (Signed)
Code sepsis ordered again on pt at [place]. Causing sepsis window to restart. Per original code sepsis order placed at 2140 on 5/1, pt is out of window. Protocol complete

## 2019-05-06 NOTE — Transfer of Care (Signed)
Immediate Anesthesia Transfer of Care Note  Patient: Diane Wilkerson  Procedure(s) Performed: Cystoscopy with retrograde pyleogram and left stent placement, basket removal of stone foley placement (Left )  Patient Location: PACU  Anesthesia Type:General  Level of Consciousness: sedated  Airway & Oxygen Therapy: Patient Spontanous Breathing and Patient connected to face mask oxygen  Post-op Assessment: Report given to RN and Post -op Vital signs reviewed and stable  Post vital signs: Reviewed and stable  Last Vitals:  Vitals Value Taken Time  BP 122/58 05/06/19 0430  Temp 37.1 C 05/06/19 0428  Pulse 105 05/06/19 0432  Resp 23 05/06/19 0432  SpO2 100 % 05/06/19 0432  Vitals shown include unvalidated device data.  Last Pain:  Vitals:   05/05/19 2315  TempSrc: Oral  PainSc:          Complications: No apparent anesthesia complications

## 2019-05-06 NOTE — Anesthesia Postprocedure Evaluation (Signed)
Anesthesia Post Note  Patient: AHNYLA DARIEN  Procedure(s) Performed: Cystoscopy with retrograde pyleogram and left stent placement, basket removal of stone foley placement (Left )     Patient location during evaluation: PACU Anesthesia Type: General Level of consciousness: sedated, oriented and patient cooperative Pain management: pain level controlled Vital Signs Assessment: post-procedure vital signs reviewed and stable Respiratory status: spontaneous breathing, nonlabored ventilation, respiratory function stable and patient connected to nasal cannula oxygen Cardiovascular status: blood pressure returned to baseline and stable Postop Assessment: no apparent nausea or vomiting Anesthetic complications: no    Last Vitals:  Vitals:   05/06/19 1000 05/06/19 1100  BP: (!) 104/58 109/64  Pulse: 97   Resp: (!) 25 18  Temp:    SpO2: 100% 99%    Last Pain:  Vitals:   05/06/19 1100  TempSrc:   PainSc: 0-No pain                 Alva Broxson,E. Davelle Anselmi

## 2019-05-06 NOTE — Anesthesia Procedure Notes (Signed)
Procedure Name: Intubation Date/Time: 05/06/2019 3:42 AM Performed by: Cynda Familia, CRNA Pre-anesthesia Checklist: Patient identified, Emergency Drugs available, Suction available and Patient being monitored Patient Re-evaluated:Patient Re-evaluated prior to induction Oxygen Delivery Method: Circle System Utilized Preoxygenation: Pre-oxygenation with 100% oxygen Induction Type: IV induction, Rapid sequence and Cricoid Pressure applied Ventilation: Mask ventilation without difficulty Laryngoscope Size: Miller and 2 Grade View: Grade III Tube type: Oral Tube size: 7.0 mm Number of attempts: 1 Airway Equipment and Method: Stylet and Oral airway Placement Confirmation: ETT inserted through vocal cords under direct vision,  positive ETCO2 and breath sounds checked- equal and bilateral Secured at: 21 cm Tube secured with: Tape Dental Injury: Teeth and Oropharynx as per pre-operative assessment  Difficulty Due To: Difficult Airway- due to anterior larynx, Difficult Airway- due to limited oral opening and Difficult Airway- due to large tongue Comments: IV induction-- [place]--- RSI-- intubation AM RNA atraumatic-- teeth and mouth as preop--   Chipped front teeth preop-- not reported by patient--unchanged with laryngoscopy--- bilat BS Moser--  Consider Glidescope intubation very limited view

## 2019-05-06 NOTE — Progress Notes (Signed)
PROGRESS NOTE    Diane Wilkerson  [id number] DOB: 1940-04-26 DOA: 05/05/2019 PCP: Willey Blade, MD    Chief Complaint  Patient presents with  . Fever    Brief Narrative:  Patient is a pleasant 79 year old female history of diabetes type 2, pulmonary sarcoidosis, hypertension presented to the ED with left flank pain and fever.  Patient noted to have a temperature at home as high as 101.  Patient seen in the ED CT abdomen and pelvis showing evidence of left-sided pyelonephritis with concerns for a passed calculus near UVJ in the left bladder.  Patient noted to be septic with a severe lactic acidosis, acute renal failure suggestive of organ dysfunction, left-sided pyelonephritis secondary to retained left ureteral stone.  Urology was consulted and patient taken emergently to the OR and underwent cystoscopy stone extraction, left ureteral stent placement.  Patient noted to have left hydronephrosis as well.  Patient started empirically on broad-spectrum IV antibiotics and awaiting placement in the stepdown unit.   Assessment & Plan:   Principal Problem:   Severe sepsis with acute organ dysfunction due to gram-negative bacteria (HCC) Active Problems:   Essential hypertension   Uncontrolled type 2 diabetes mellitus with hyperglycemia, without long-term current use of insulin (HCC)   AKI (acute kidney injury) (HCC)   Lactic acidosis   Hypokalemia, inadequate intake   Adrenal adenoma, right   Acute pyelonephritis  #1 severe sepsis with acute organ dysfunction secondary to acute left pyelonephritis, complicated UTI with left distal ureteral stone and left hydronephrosis. Patient admitted meeting criteria for sepsis with acute organ dysfunction.  Patient on admission noted to have a severe lactic acidosis, acute renal failure, left-sided pyelonephritis likely secondary to retained left ureteral stone, fever of 103.1, noted to be tachycardic with heart rate of 137, tachypneic with  respiratory rate of 28 and initial presentation with a blood pressure of 96/58.  CT abdomen and pelvis done suggestive that stone may have passed into the left bladder.  Admitting physician discussed with urology and was for the was not convincing CT evidence that stone may have passed and patient was taken emergently to the OR.  Patient underwent cystoscopy, left basket extraction of ureteral stone, left retrograde pyelogram with interpretation, left ureteral stent placement and tolerated procedure well.  Patient currently in the PACU.  Patient pancultured.  Blood culture urine cultures pending.  Lactic acidosis trending down.  Patient received a dose of IV Rocephin and IV azithromycin in the ED.  Patient awaiting to receive a dose of IV vancomycin.  Patient will be placed on IV Zosyn.  Leave Foley catheter in place as per urology.  Urology following and appreciate input and recommendations.  2.  Acute renal failure Likely secondary to a post renal azotemia secondary to hydronephrosis in the setting of ureteral stone status post stone extraction and stent placement in the setting of sepsis secondary to pyelonephritis/complicated UTI and on chronic diuretic and ARB.Marland Kitchen  Per urology on review of records from care everywhere notes from January 2021 reporting a creatinine of 1.8, 1.0 in September 2020, 0.9 in June 2020.  Creatinine on admission was 2.64.  Patient also noted to have a borderline blood pressure with systolics in the 0000000 on admission and noted to be septic.  Urinalysis obtained on admission cloudy, moderate leukocytes, nitrite negative, 21-50 WBCs, many bacteria.  Urine cultures pending.  Check urine sodium, urine creatinine.  Continue IV fluids.  Monitor urine output.  Follow renal function.  Hold ARB and diuretic.  If no improvement with renal function in the next 24 to 48 hours we will check a renal ultrasound.  Follow.  3.  Hypokalemia/hypomagnesemia Potassium at 4.0 this morning.  Magnesium at  1.4.  Magnesium sulfate 4 g IV x1.  Follow.  4.  Transaminitis Likely secondary to acute infection and dehydration.  LFTs trending down.  Follow.  5.  Anemia Likely dilutional.  Repeat hemoglobin this morning at 8.9 from 10.4 on admission.  Anemia panel obtained with a iron level of 7, ferritin 183, folate of 5.1.  Placed on folic acid 1 mg p.o. daily.  Follow H&H.  May need IV iron during this hospitalization however will defer for now.  Transfusion threshold hemoglobin < 7.  6.  Right adrenal adenoma Incidental finding on CT imaging that appears benign.  Outpatient follow-up.  7.  Hypertension Continue to hold antihypertensive medications at this time.  8.  Lactic acidosis Patient with a severe lactic acidosis likely secondary to sepsis and volume depletion.  Lactic acid levels slowly trending down.  Continue hydration with IV fluids, empiric IV antibiotics.  Follow.  9.  Type 2 diabetes mellitus Hemoglobin A1c 7.1 (05/05/2019).  CBG of 149.  Patient however currently n.p.o.  Patient will be started on a carb modified diet.  Continue sliding scale insulin.  Hold oral hypoglycemic agents.  10.  History of pulmonary sarcoidosis Currently stable.  Outpatient follow-up.    DVT prophylaxis: SCDs Code Status: Full Family Communication: Updated patient.  No family at bedside. Disposition:   Status is: Inpatient    Dispo: The patient is from: Home              Anticipated d/c is to: Likely home              Anticipated d/c date is: To be determined.              Patient currently in PACU awaiting stepdown unit bed.  IV antibiotics ordered and pending, patient septic with a high degree of decompensation just returning from the OR needing high intensity of care/monitoring over at least the next 24 to 48 hours.        Consultants:   Urology: Dr. Eskridge/Dr. Fredderick Phenix 05/06/2019    Procedures:   CT abdomen and pelvis 05/06/2019  CT chest 05/06/2019  Chest x-ray  05/05/2019  Cystoscopy/left basket extraction of ureteral stone/left retrograde pyelogram with interpretation/left urethral stent placement/simple Foley catheter placement per Dr. Ella Bodo 05/06/2019  Antimicrobials:   IV vancomycin x1 dose pending 05/06/2019  IV Zosyn 05/06/2019  IV azithromycin x1 dose 05/05/2019  IV Rocephin x1 dose 05/05/2019     Subjective: Patient somewhat drowsy.  In PACU.  Denies any chest pain or shortness of breath.  Denies any abdominal pain.  Objective: Vitals:   05/06/19 0609 05/06/19 0700 05/06/19 0715 05/06/19 0800  BP:  113/62  (!) 98/56  Pulse:  96 93 92  Resp:  17 (!) 23 16  Temp:      TempSrc:      SpO2: 100% 100% 100% 100%  Weight:      Height:        Intake/Output Summary (Last 24 hours) at 05/06/2019 0925 Last data filed at 05/06/2019 0800 Gross per 24 hour  Intake 5172.16 ml  Output 460 ml  Net 4712.16 ml   Filed Weights   05/05/19 2134  Weight: 86.2 kg    Examination:  General exam: Drowsy.  Respiratory system: Clear to auscultation anterior lung fields. Respiratory  effort normal. Cardiovascular system: Tachycardic.  No JVD, murmurs rubs or gallops.  No lower extremity edema. Gastrointestinal system: Abdomen is nondistended, soft and nontender, obese. No organomegaly or masses felt. Normal bowel sounds heard. Central nervous system: Alert and oriented. No focal neurological deficits. Extremities: Symmetric 5 x 5 power. Skin: No rashes, lesions or ulcers Psychiatry: Judgement and insight appear fair. Mood & affect appropriate.     Data Reviewed: I have personally reviewed following labs and imaging studies  CBC: Recent Labs  Lab 05/05/19 2134 05/06/19 0742  WBC 3.9* 16.9*  NEUTROABS 3.5 15.1*  HGB 10.4* 8.9*  HCT 31.9* 28.7*  MCV 88.4 90.8  PLT 295 [id number]    Basic Metabolic Panel: Recent Labs  Lab 05/05/19 2134 05/06/19 0742  NA 136 142  K 3.4* 4.0  CL 103 112*  CO2 20* 22  GLUCOSE 253* 167*  BUN 40* 41*   CREATININE 2.64* 2.67*  CALCIUM 9.4 8.3*  MG  --  1.4*    GFR: Estimated Creatinine Clearance: 18.3 mL/min (A) (by C-G formula based on SCr of 2.67 mg/dL (H)).  Liver Function Tests: Recent Labs  Lab 05/05/19 2134 05/06/19 0742  AST 117* 95*  ALT 60* 60*  ALKPHOS 102 53  BILITOT 0.7 0.6  PROT 6.5 5.8*  ALBUMIN 3.8 3.2*    CBG: Recent Labs  Lab 05/06/19 0302 05/06/19 0428 05/06/19 0609  GLUCAP 156* 146* 149*     Recent Results (from the past 240 hour(s))  Respiratory Panel by RT PCR (Flu A&B, Covid) - Nasopharyngeal Swab     Status: None   Collection Time: 05/05/19  9:41 PM   Specimen: Nasopharyngeal Swab  Result Value Ref Range Status   SARS Coronavirus 2 by RT PCR NEGATIVE NEGATIVE Final    Comment: (NOTE) SARS-CoV-2 target nucleic acids are NOT DETECTED. The SARS-CoV-2 RNA is generally detectable in upper respiratoy specimens during the acute phase of infection. The lowest concentration of SARS-CoV-2 viral copies this assay can detect is 131 copies/mL. A negative result does not preclude SARS-Cov-2 infection and should not be used as the sole basis for treatment or other patient management decisions. A negative result may occur with  improper specimen collection/handling, submission of specimen other than nasopharyngeal swab, presence of viral mutation(s) within the areas targeted by this assay, and inadequate number of viral copies (<131 copies/mL). A negative result must be combined with clinical observations, patient history, and epidemiological information. The expected result is Negative. Fact Sheet for Patients:  PinkCheek.be Fact Sheet for Healthcare Providers:  GravelBags.it This test is not yet ap proved or cleared by the [place] FDA and  has been authorized for detection and/or diagnosis of SARS-CoV-2 by FDA under an Emergency Use Authorization (EUA). This EUA will remain  in effect  (meaning this test can be used) for the duration of the COVID-19 declaration under Section 564(b)(1) of the Act, 21 U.S.C. section 360bbb-3(b)(1), unless the authorization is terminated or revoked sooner.    Influenza A by PCR NEGATIVE NEGATIVE Final   Influenza B by PCR NEGATIVE NEGATIVE Final    Comment: (NOTE) The Xpert Xpress SARS-CoV-2/FLU/RSV assay is intended as an aid in  the diagnosis of influenza from Nasopharyngeal swab specimens and  should not be used as a sole basis for treatment. Nasal washings and  aspirates are unacceptable for Xpert Xpress SARS-CoV-2/FLU/RSV  testing. Fact Sheet for Patients: PinkCheek.be Fact Sheet for Healthcare Providers: GravelBags.it This test is not yet approved or cleared by the [place]  States FDA and  has been authorized for detection and/or diagnosis of SARS-CoV-2 by  FDA under an Emergency Use Authorization (EUA). This EUA will remain  in effect (meaning this test can be used) for the duration of the  Covid-19 declaration under Section 564(b)(1) of the Act, 21  U.S.C. section 360bbb-3(b)(1), unless the authorization is  terminated or revoked. Performed at [facility], [place] [address], [place], [place] [address]          Radiology Studies: CT ABDOMEN PELVIS WO CONTRAST  Result Date: 05/06/2019 CLINICAL DATA:  Persistent cough, lethargy and chills, abdominal distension and right flank pain with acute kidney injury and sepsis EXAM: CT CHEST, ABDOMEN AND PELVIS WITHOUT CONTRAST TECHNIQUE: Multidetector CT imaging of the chest, abdomen and pelvis was performed following the standard protocol without IV contrast. COMPARISON:  Chest radiograph 05/05/2019 FINDINGS: CT CHEST FINDINGS Cardiovascular: Luminal evaluation of vasculature precluded in the absence of contrast media. Hypoattenuation of the cardiac blood pool relative to the myocardium suggests a relative anemia.  Normal cardiac size. No pericardial effusion. Calcifications are present on the mitral annulus. Few coronary artery calcifications are present. Suspect calcification of the aortic leaflets as well. Atherosclerotic plaque within the normal caliber aorta. An shared origin of the brachiocephalic and left common carotid artery. Minimal calcification in the proximal great vessels. Central pulmonary arteries are normal caliber. Mediastinum/Nodes: No mediastinal fluid or gas. Diminutive appearance of the right thyroid lobe, could reflect prior thyroid surgery. Thyroid gland and thoracic inlet are otherwise unremarkable. No acute abnormality of the trachea. Small sliding-type hiatal hernia. No worrisome mediastinal or axillary adenopathy. Hilar nodal evaluation is limited in the absence of intravenous contrast media. Lungs/Pleura: Coronal imaging demonstrates mild cephalad redistribution of the pulmonary vascularity with interlobular septal thickening towards the apices and lung bases. No focal consolidation, pneumothorax or effusion. Dependent areas of atelectasis are present with additional bandlike opacities in the right middle lobe and both lung bases compatible with scarring and or atelectasis. No concerning pulmonary nodules or masses. Musculoskeletal: No chest wall mass or suspicious bone lesions identified. Multilevel degenerative changes are present in the imaged portions of the spine. Minimal degenerative changes in bilateral glenohumeral joints. CT ABDOMEN PELVIS FINDINGS Hepatobiliary: Diffuse hepatic hypoattenuation compatible with hepatic steatosis. Focal fatty sparing along the gallbladder fossa. Normal gallbladder. No visible calcified gallstones. No biliary ductal dilatation. Pancreas: Unremarkable. No pancreatic ductal dilatation or surrounding inflammatory changes. Spleen: Small calcification in the splenic hilum could reflect a calcified granuloma or vascular calcification. No worrisome splenic lesion.  Normal splenic size. 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 Additional mild nonspecific thickening of the left adrenal gland without concerning dominant nodule. The kidneys are orthotopic. Bilateral fluid attenuation renal cysts are present, in the right kidney measures up to 3.8 cm, in the left measuring up to 2.4 cm. No concerning renal masses. There is bilateral perinephric stranding which is slightly asymmetrically increased on the left with mild pelviectasis and ureterectasis to the level of the urinary bladder. A punctate radiodensity is seen along the left posterolateral margin of the urinary bladder separate from the UV junction though could reflect a recently passed calculus. Additional nonobstructing calculus versus vascular calcifications seen in the lower pole left kidney as well. Stomach/Bowel: Small hiatal hernia, as above. Distal stomach and duodenum are unremarkable with normal course of the duodenum across the midline abdomen. No small bowel dilatation  or wall thickening. A normal appendix is visualized. No colonic dilatation or wall thickening. Scattered colonic diverticula without focal pericolonic inflammation to suggest diverticulitis. No evidence of mechanical bowel obstruction. Vascular/Lymphatic: Atherosclerotic plaque within the normal caliber aorta. No pathologically enlarged abdominopelvic lymph nodes. Reproductive: Anteverted uterus with partially calcified anterior lobular probable fibroid measuring up to 6 cm in size. No concerning adnexal lesions. Other: No abdominopelvic free fluid or free gas. No bowel containing hernias. Small fat containing umbilical hernia. Musculoskeletal: Mild levocurvature of the lumbar spine, apex L4. Multilevel degenerative changes are present in the imaged portions of the spine. Additional degenerative changes at the SI joints, bilateral  hips and symphysis pubis. No acute osseous abnormality or suspicious osseous lesion. IMPRESSION: 1. Features of vascular congestion with early/mild interstitial edema. No other acute intrathoracic abnormality. 2. Punctate radiodensity in the left posterolateral bladder, separate from the left UVJ. Could reflect a recently passed calculus with mild left pelviectasis and ureterectasis with some asymmetric left renal stranding. 3. Additional nonobstructing calculus versus vascular calcifications in the lower pole left kidney as well. 4. Hepatic steatosis. 5. 1.6 cm low-attenuation nodule in the right adrenal gland most compatible with benign adenoma. 6. Colonic diverticulosis without evidence of diverticulitis. 7. Fibroid uterus. 8. Small sliding-type hiatal hernia. 9. Hypoattenuation of the cardiac blood pool suggests anemia. 10. Aortic Atherosclerosis (ICD10-I70.0). Electronically Signed   By: Lovena Le M.D.   On: 05/06/2019 00:55   DG Chest 2 View  Result Date: 05/05/2019 CLINICAL DATA:  Lethargy and chills. EXAM: CHEST - 2 VIEW COMPARISON:  None. FINDINGS: Very mild, diffusely increased lung markings are seen without evidence of acute infiltrate, pleural effusion or pneumothorax. The heart size and mediastinal contours are within normal limits. Very mild prominence of the right hilum is seen. The visualized skeletal structures are unremarkable. IMPRESSION: 1. Very mild, diffusely increased lung markings which are likely chronic in nature. 2. Mild prominence of the right hilum which may be vascular in origin. Mild lymphadenopathy cannot be excluded. Electronically Signed   By: Virgina Norfolk M.D.   On: 05/05/2019 22:32   CT Chest Wo Contrast  Result Date: 05/06/2019 CLINICAL DATA:  Persistent cough, lethargy and chills, abdominal distension and right flank pain with acute kidney injury and sepsis EXAM: CT CHEST, ABDOMEN AND PELVIS WITHOUT CONTRAST TECHNIQUE: Multidetector CT imaging of the chest, abdomen  and pelvis was performed following the standard protocol without IV contrast. COMPARISON:  Chest radiograph 05/05/2019 FINDINGS: CT CHEST FINDINGS Cardiovascular: Luminal evaluation of vasculature precluded in the absence of contrast media. Hypoattenuation of the cardiac blood pool relative to the myocardium suggests a relative anemia. Normal cardiac size. No pericardial effusion. Calcifications are present on the mitral annulus. Few coronary artery calcifications are present. Suspect calcification of the aortic leaflets as well. Atherosclerotic plaque within the normal caliber aorta. An shared origin of the brachiocephalic and left common carotid artery. Minimal calcification in the proximal great vessels. Central pulmonary arteries are normal caliber. Mediastinum/Nodes: No mediastinal fluid or gas. Diminutive appearance of the right thyroid lobe, could reflect prior thyroid surgery. Thyroid gland and thoracic inlet are otherwise unremarkable. No acute abnormality of the trachea. Small sliding-type hiatal hernia. No worrisome mediastinal or axillary adenopathy. Hilar nodal evaluation is limited in the absence of intravenous contrast media. Lungs/Pleura: Coronal imaging demonstrates mild cephalad redistribution of the pulmonary vascularity with interlobular septal thickening towards the apices and lung bases. No focal consolidation, pneumothorax or effusion. Dependent areas of atelectasis are present with additional bandlike opacities  in the right middle lobe and both lung bases compatible with scarring and or atelectasis. No concerning pulmonary nodules or masses. Musculoskeletal: No chest wall mass or suspicious bone lesions identified. Multilevel degenerative changes are present in the imaged portions of the spine. Minimal degenerative changes in bilateral glenohumeral joints. CT ABDOMEN PELVIS FINDINGS Hepatobiliary: Diffuse hepatic hypoattenuation compatible with hepatic steatosis. Focal fatty sparing along the  gallbladder fossa. Normal gallbladder. No visible calcified gallstones. No biliary ductal dilatation. Pancreas: Unremarkable. No pancreatic ductal dilatation or surrounding inflammatory changes. Spleen: Small calcification in the splenic hilum could reflect a calcified granuloma or vascular calcification. No worrisome splenic lesion. Normal splenic size. 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 Additional mild nonspecific thickening of the left adrenal gland without concerning dominant nodule. The kidneys are orthotopic. Bilateral fluid attenuation renal cysts are present, in the right kidney measures up to 3.8 cm, in the left measuring up to 2.4 cm. No concerning renal masses. There is bilateral perinephric stranding which is slightly asymmetrically increased on the left with mild pelviectasis and ureterectasis to the level of the urinary bladder. A punctate radiodensity is seen along the left posterolateral margin of the urinary bladder separate from the UV junction though could reflect a recently passed calculus. Additional nonobstructing calculus versus vascular calcifications seen in the lower pole left kidney as well. Stomach/Bowel: Small hiatal hernia, as above. Distal stomach and duodenum are unremarkable with normal course of the duodenum across the midline abdomen. No small bowel dilatation or wall thickening. A normal appendix is visualized. No colonic dilatation or wall thickening. Scattered colonic diverticula without focal pericolonic inflammation to suggest diverticulitis. No evidence of mechanical bowel obstruction. Vascular/Lymphatic: Atherosclerotic plaque within the normal caliber aorta. No pathologically enlarged abdominopelvic lymph nodes. Reproductive: Anteverted uterus with partially calcified anterior lobular probable fibroid measuring up to 6 cm in size. No concerning  adnexal lesions. Other: No abdominopelvic free fluid or free gas. No bowel containing hernias. Small fat containing umbilical hernia. Musculoskeletal: Mild levocurvature of the lumbar spine, apex L4. Multilevel degenerative changes are present in the imaged portions of the spine. Additional degenerative changes at the SI joints, bilateral hips and symphysis pubis. No acute osseous abnormality or suspicious osseous lesion. IMPRESSION: 1. Features of vascular congestion with early/mild interstitial edema. No other acute intrathoracic abnormality. 2. Punctate radiodensity in the left posterolateral bladder, separate from the left UVJ. Could reflect a recently passed calculus with mild left pelviectasis and ureterectasis with some asymmetric left renal stranding. 3. Additional nonobstructing calculus versus vascular calcifications in the lower pole left kidney as well. 4. Hepatic steatosis. 5. 1.6 cm low-attenuation nodule in the right adrenal gland most compatible with benign adenoma. 6. Colonic diverticulosis without evidence of diverticulitis. 7. Fibroid uterus. 8. Small sliding-type hiatal hernia. 9. Hypoattenuation of the cardiac blood pool suggests anemia. 10. Aortic Atherosclerosis (ICD10-I70.0). Electronically Signed   By: Lovena Le M.D.   On: 05/06/2019 00:55   DG C-Arm 1-60 Min-No Report  Result Date: 05/06/2019 Fluoroscopy was utilized by the requesting physician.  No radiographic interpretation.        Scheduled Meds: . enoxaparin (LOVENOX) injection  40 mg Subcutaneous Q24H  . insulin aspart      . insulin aspart  0-15 Units Subcutaneous Q6H  . oxybutynin      . oxybutynin  5 mg Oral TID  . tamsulosin  0.4 mg Oral Daily  Continuous Infusions: . acetaminophen    . lactated ringers with kcl 125 mL/hr at 05/06/19 0458  . lactated ringers    . [MAR Hold] piperacillin-tazobactam    . [MAR Hold] vancomycin       LOS: 0 days    Time spent: 40 minutes  No charge    Irine Seal, MD [facility]   To contact the attending provider between 7A-7P or the covering provider during after hours 7P-7A, please log into the web site [email or web address] and access using universal [place] password for that web site. If you do not have the password, please call the hospital operator.  05/06/2019, 9:25 AM

## 2019-05-06 NOTE — Anesthesia Procedure Notes (Signed)
Date/Time: 05/06/2019 4:17 AM Performed by: Cynda Familia, CRNA Oxygen Delivery Method: Simple face mask Placement Confirmation: positive ETCO2 and breath sounds checked- equal and bilateral Dental Injury: Teeth and Oropharynx as per pre-operative assessment

## 2019-05-06 NOTE — ED Provider Notes (Signed)
Care assumed from Dr. Ronnald Nian.  Diabetic and hypertensive patient here with fatigue and cough as well as fever.  Sepsis work-up initiated.  She had elevated lactate.  First concern for possible pneumonia versus UTI.  She is pending a CT chest as well as abdomen pelvis.  She does have a lactic acidosis and elevated creatinine.  She received 30 cc/kg of fluids and broad-spectrum antibiotics.  CT scanning shows mild pulmonary edema as well as likely recently passed kidney stone in the bladder.  D/w Dr. Fredderick Phenix of urology. He reviewed CT images and states it is difficult to tell whether stone is in UPJ or ureter.  He recommends n.p.o. and will come evaluate.  Agrees with medical admission  Patient has been updated.  She is continue to maintain her airway mental status.  Continue IV fluids.  Will escalate antibiotics to IV Zosyn. Urinalysis is pending.  Will place Foley catheter.  Admission discussed with Dr. Marlyce Huge  CRITICAL CARE Performed by: Ezequiel Essex Total critical care time: 35 minutes Critical care time was exclusive of separately billable procedures and treating other patients. Critical care was necessary to treat or prevent imminent or life-threatening deterioration. Critical care was time spent personally by me on the following activities: development of treatment plan with patient and/or surrogate as well as nursing, discussions with consultants, evaluation of patient's response to treatment, examination of patient, obtaining history from patient or surrogate, ordering and performing treatments and interventions, ordering and review of laboratory studies, ordering and review of radiographic studies, pulse oximetry and re-evaluation of patient's condition.    Ezequiel Essex, MD 05/06/19 [phone]

## 2019-05-06 NOTE — ED Notes (Signed)
Bladder scan showed 18mL

## 2019-05-06 NOTE — Progress Notes (Signed)
PHARMACY - PHYSICIAN COMMUNICATION CRITICAL VALUE ALERT - BLOOD CULTURE IDENTIFICATION (BCID)  Diane Wilkerson is an 79 y.o. female who presented to [facility] on 05/05/2019 with a chief complaint of fever, UTI/pyelo  Assessment:  1 of 4 bottles from BCx growing [place]  Name of physician (or Provider) Contacted: [place] Silos  Current antibiotics: Zosyn  Changes to prescribed antibiotics recommended:  Narrow Zosyn to Rocephin  Results for orders placed or performed during the hospital encounter of 05/05/19  Blood Culture ID Panel (Reflexed) (Collected: 05/05/2019  9:34 PM)  Result Value Ref Range   Enterococcus species NOT DETECTED NOT DETECTED   Listeria monocytogenes NOT DETECTED NOT DETECTED   Staphylococcus species NOT DETECTED NOT DETECTED   Staphylococcus aureus (BCID) NOT DETECTED NOT DETECTED   Streptococcus species NOT DETECTED NOT DETECTED   Streptococcus agalactiae NOT DETECTED NOT DETECTED   Streptococcus pneumoniae NOT DETECTED NOT DETECTED   Streptococcus pyogenes NOT DETECTED NOT DETECTED   Acinetobacter baumannii NOT DETECTED NOT DETECTED   Enterobacteriaceae species DETECTED (A) NOT DETECTED   Enterobacter cloacae complex NOT DETECTED NOT DETECTED   Escherichia coli DETECTED (A) NOT DETECTED   Klebsiella oxytoca NOT DETECTED NOT DETECTED   Klebsiella pneumoniae NOT DETECTED NOT DETECTED   Proteus species NOT DETECTED NOT DETECTED   Serratia marcescens NOT DETECTED NOT DETECTED   Carbapenem resistance NOT DETECTED NOT DETECTED   Haemophilus influenzae NOT DETECTED NOT DETECTED   Neisseria meningitidis NOT DETECTED NOT DETECTED   Pseudomonas aeruginosa NOT DETECTED NOT DETECTED   Candida albicans NOT DETECTED NOT DETECTED   Candida glabrata NOT DETECTED NOT DETECTED   Candida krusei NOT DETECTED NOT DETECTED   Candida parapsilosis NOT DETECTED NOT DETECTED   Candida tropicalis NOT DETECTED NOT DETECTED    Kara Mead 05/06/2019  3:50 PM

## 2019-05-06 NOTE — Anesthesia Preprocedure Evaluation (Addendum)
Anesthesia Evaluation  Patient identified by MRN, date of birth, ID band Patient awake    Reviewed: Allergy & Precautions, NPO status , Patient's Chart, lab work & pertinent test results  History of Anesthesia Complications Negative for: history of anesthetic complications  Airway Mallampati: III  TM Distance: >3 FB Neck ROM: Full    Dental  (+) Dental Advisory Given, Teeth Intact   Pulmonary neg pulmonary ROS, neg recent URI,    breath sounds clear to auscultation       Cardiovascular hypertension, Pt. on medications  Rhythm:Regular Rate:Tachycardia     Neuro/Psych negative neurological ROS  negative psych ROS   GI/Hepatic   Endo/Other  diabetesMorbid obesity  Renal/GU ARFRenal disease     Musculoskeletal   Abdominal (+) + obese,   Peds  Hematology  (+) Blood dyscrasia, anemia , Hgb 10.4   Anesthesia Other Findings Elevated lactate (5.3)  Reproductive/Obstetrics                            Anesthesia Physical Anesthesia Plan  ASA: III and emergent  Anesthesia Plan: General   Post-op Pain Management:    Induction: Intravenous, Rapid sequence and Cricoid pressure planned  PONV Risk Score and Plan: 3 and Ondansetron and Dexamethasone  Airway Management Planned: Oral ETT  Additional Equipment: None  Intra-op Plan:   Post-operative Plan: Extubation in OR  Informed Consent: I have reviewed the patients History and Physical, chart, labs and discussed the procedure including the risks, benefits and alternatives for the proposed anesthesia with the patient or authorized representative who has indicated his/her understanding and acceptance.     Dental advisory given  Plan Discussed with: CRNA and Surgeon  Anesthesia Plan Comments:        Anesthesia Quick Evaluation

## 2019-05-06 NOTE — Consult Note (Signed)
Urology Consult Note   Requesting Attending Physician:  Ezequiel Essex, MD Service Providing Consult: Urology   Reason for Consult:  Nephrolithiasis  HPI: Diane Wilkerson is seen in consultation for reasons noted above at the request of Rancour, Annie Main, MD for evaluation of obstructing infected nephrolithiasis.  Patient reports that she became symptomatic approximately 18 hours ago with malaise fatigue and vague left flank discomfort.  Temperature at home was 102.  She took 4 aspirin 81 mg.  Upon presentation to the emergency department, she was noted to be febrile to 103, tachycardic to the 130s, relative hypotension, leukopenic to 3.9, lactate was elevated to 6.3.  Creatinine was 2.6. She denies any urinary frequency, urgency, hesitancy, sensation of incomplete voiding from baseline.  She denies any dysuria or hematuria.    She denies ever being diagnosed with chronic kidney disease to her knowledge.  Although review of care everywhere notes from January 2021 report a creatinine of 1.8, 1.0 in September 2020, 0.9 in June 2020  No prior history of nephrolithiasis.  No history of bowel surgery, irritable bowel disease, inflammatory bowel syndromes.  Denies a history of recurrent urinary tract infections   Past Medical History: diabetes, hypertension, sarcoidosis, joint pain.   Past Surgical History:  Denies  Medication: Current Facility-Administered Medications  Medication Dose Route Frequency Provider Last Rate Last Admin   lactated ringers infusion   Intravenous Continuous Shalhoub, Sherryll Burger, MD       piperacillin-tazobactam (ZOSYN) IVPB 3.375 g  3.375 g Intravenous Once Rancour, Stephen, MD       Current Outpatient Medications  Medication Sig Dispense Refill   glipizide-metformin (METAGLIP) 2.5-250 MG tablet Take 1 tablet by mouth 2 (two) times daily.     losartan-hydrochlorothiazide (HYZAAR) 100-25 MG tablet Take 1 tablet by mouth daily.      Allergies: No Known  Allergies  Social History: Social History   Tobacco Use   Smoking status: Not on file  Substance Use Topics   Alcohol use: Not on file   Drug use: Not on file    Family History Denies family history of nephrolithiasis  Review of Systems 10 systems were reviewed and are negative except as noted specifically in the HPI.  Objective   Vital signs in last 24 hours: BP 106/62    Pulse (!) 116    Temp (!) 100.5 F (38.1 C) (Oral)    Resp (!) 23    Ht 5\' 2"  (1.575 m)    Wt 86.2 kg    SpO2 97%    BMI 34.75 kg/m   Physical Exam General: NAD, A&O, resting, appropriate HEENT: [place]/AT, EOMI, MMM Pulmonary: Normal work of breathing Cardiovascular: Tachycardic to 120s, relative hypotension with blood pressure 106/62 Abdomen: Soft, NTTP, nondistended, GU: Foley catheter placed at bedside, 14 [place]; clear yellow urine returned without stone debris.  Hand irrigated at bedside without return of stone or stone debris.  She does not have left or right CVA tenderness Extremities: warm and well perfused Neuro: Appropriate, no focal neurological deficits  Most Recent Labs: Lab Results  Component Value Date   WBC 3.9 (L) 05/05/2019   HGB 10.4 (L) 05/05/2019   HCT 31.9 (L) 05/05/2019   PLT 295 05/05/2019    Lab Results  Component Value Date   NA 136 05/05/2019   K 3.4 (L) 05/05/2019   CL 103 05/05/2019   CO2 20 (L) 05/05/2019   BUN 40 (H) 05/05/2019   CREATININE 2.64 (H) 05/05/2019   CALCIUM 9.4 05/05/2019  Lab Results  Component Value Date   INR 1.1 05/05/2019   APTT 27 05/05/2019   IMAGING: CT abdomen pelvis without contrast May 06, 2019  Mild right hydronephrosis and hydroureter to the level of the UVJ. There is approximately a 1 mm radiodensity at what appears to be the left UVJ versus intravesical, although it is slightly left of midline and not truly gravity dependent. Several small nonobstructing stones in the left lower pole the kidney. Right perinephric  stranding.  6.1 cm right adrenal nodule. Bilateral renal cysts.  Fibroid uterus.  ------  Assessment:  79 y.o. female with history of sarcoidosis, diabetes, hypertension,?  CKD who presented with left flank discomfort and fever with a CT scan that demonstrated a 1 mm UVJ obstructing stone with associated left hydroureteronephrosis.    Concerning for infection given: Fever, tachycardia, hypotension, leukopenia, lactic acidosis, acute on chronic kidney injury.  We discussed the indications for acute intervention including infected obstruction, bilateral ureteral obstruction or unilateral obstruction of solitary kidney as well as other less urgent indications for decompression which would included intractable pain, N/V, and acute renal injury. We also discussed the possible inability to place a ureteral stent in which case, the next intervention recommended would be a percutaneous nephrostomy tube. Given infection with obstruction is present, plan for emergent left ureteral stent placement.  Patient is Covid negative.   Recommendations: 1. Plan for emergent ureteral stent placement 2. Case posted 3. Keep patient NPO 4. Consent obtained from patient 5. Send urine culture at this time, prior to OR 6. Start broad spectrum antibiotics 7. Initiate sepsis protocol 8. Continue Foley for maximal urinary decompression 9. Appreciate hospitalist assistance.    Thank you for this consult. Please contact the urology consult pager with any further questions/concerns.

## 2019-05-06 NOTE — Progress Notes (Signed)
Pharmacy Antibiotic Note  JONIE BAUERS is a 79 y.o. female admitted on 05/05/2019 with sepsis.  CT scan w/ UVJ obstructing stone and left hydroureteronephrosis; plan for emergent left ureteral stent placement.  Pharmacy has been consulted for Zosyn dosing given concern for UTI, nephrolithiasis, pyelonephritis.  Plan: Zosyn 3.375g IV Q8H infused over 4hrs.  Follow up renal function, culture results, and clinical course.   Height: 5\' 2"  (157.5 cm) Weight: 86.2 kg (190 lb) IBW/kg (Calculated) : 50.1  Temp (24hrs), Avg:100.3 F (37.9 C), Min:98.8 F (37.1 C), Max:103.1 F (39.5 C)  Recent Labs  Lab 05/05/19 2134 05/05/19 2357 05/06/19 0610 05/06/19 0742  WBC 3.9*  --   --  16.9*  CREATININE 2.64*  --   --  2.67*  LATICACIDVEN 6.3* 5.3* 3.4*  --     Estimated Creatinine Clearance: 18.3 mL/min (A) (by C-G formula based on SCr of 2.67 mg/dL (H)).    No Known Allergies  Antimicrobials this admission: 5/1 Azithromycin >> 5/2 5/1 Ceftriaxone >> 5/2 5/2 Vancomycin x1 5/2 Zosyn >>   Dose adjustments this admission:  Microbiology results: 5/1 SARS CoV2: neg, Influenza A/B: neg 5/1 BCx: 5/1 UCx (catheter): 5/2 UCx (cystoscope):    Thank you for allowing pharmacy to be a part of this patient's care.  Gretta Arab PharmD, BCPS Clinical Pharmacist [facility] main pharmacy [phone] 05/06/2019 9:53 AM

## 2019-05-06 NOTE — H&P (Signed)
History and Physical    Diane Wilkerson [id number] DOB: [birth year] DOA: 05/05/2019  PCP: Willey Blade, MD  Patient coming from: Home   Chief Complaint:  Chief Complaint  Patient presents with  . Fever     HPI:    79 year old female with past medical history of diabetes mellitus type 2, pulmonary sarcoidosis, hypertension who presents to [facility] emergency department with complaints of left flank pain and fever.  Patient explains that on the afternoon of 5/1 the patient suddenly began to develop intense left-sided flank pain.  Patient describes his pain as severe in intensity, dull in quality, nonradiating and worse with movement.  This was associated with symptoms of generalized malaise and weakness.  Patient also felt feverish and took her temperature which ended up being 101 F.  Patient denies any associated nausea, vomiting or dysuria, particularly in the last several days.   Patient denies any shortness of breath or cough beyond her baseline.  Patient denies any recent travel, sick contacts or confirmed contact with COVID-19.  Patient eventually presented to [facility] with the aforementioned complaints where she was found to have multiple SIRS criteria and severe lactic acidosis concerning for sepsis.  CT imaging of the abdomen and pelvis revealed evidence of left-sided pyelonephritis with concerns for a past calculus near the UVJ in the left bladder.  The emergency department provider provided patient with 30 cc/kg of isotonic fluids.  Intravenous ceftriaxone was administered.  Case was then discussed with Dr. Fredderick Phenix with urology who promptly came and evaluated the patient in the emergency room and felt that taking the patient to the operating room for cystoscopy and possible stent placement was indicated.  The hospitalist group was then called to assess the patient for admission the hospital.   Review of Systems: A 10-system review of systems has been  performed and all systems are negative with the exception of what is listed in the HPI.   History reviewed. No pertinent past medical history.  History reviewed. No pertinent surgical history.   reports that she has never smoked. She has never used smokeless tobacco. No history on file for alcohol and drug.  No Known Allergies  Family History  Problem Relation Age of Onset  . Pancreatic cancer Mother   . Heart disease Father      Prior to Admission medications   Medication Sig Start Date End Date Taking? Authorizing Provider  glipizide-metformin (METAGLIP) 2.5-250 MG tablet Take 1 tablet by mouth 2 (two) times daily. 04/21/19  Yes [provider]  losartan-hydrochlorothiazide (HYZAAR) 100-25 MG tablet Take 1 tablet by mouth daily. 03/26/19  Yes [provider]    Physical Exam: Vitals:   05/06/19 0130 05/06/19 0145 05/06/19 0200 05/06/19 0215  BP: 119/65 (!) 96/58 110/64 (!) 116/58  Pulse: (!) 117 (!) 111 (!) 120 (!) 117  Resp: (!) 25 (!) 22 (!) 28 (!) 26  Temp:      TempSrc:      SpO2: 100% 97% 100% 100%  Weight:      Height:        Constitutional: Lethargic but arousable and oriented x3, patient is not in any acute distress. Skin: no rashes, no lesions, poor skin turgor noted. Eyes: Pupils are equally reactive to light.  No evidence of scleral icterus or conjunctival pallor.  ENMT: Dry mucous membranes noted.  Posterior pharynx clear of any exudate or lesions.   Neck: normal, supple, no masses, no thyromegaly.  No evidence of jugular  venous distension.   Respiratory: clear to auscultation bilaterally, no wheezing, no crackles. Normal respiratory effort. No accessory muscle use.  Cardiovascular: Tachycardic rate but regular rhythm, no murmurs / rubs / gallops. No extremity edema. 2+ pedal pulses. No carotid bruits.  Chest:   Nontender without crepitus or deformity.   Back:   Very mild left flank tenderness without any evidence of crepitus or  deformity. Abdomen: Mild left-sided abdominal tenderness.  Abdomen is soft however.  No evidence of intra-abdominal masses.  Positive bowel sounds noted in all quadrants.   Musculoskeletal: No joint deformity upper and lower extremities. Good ROM, no contractures. Normal muscle tone.  Neurologic: Lethargic but arousable and oriented x3.  Is moving all 4 extremity spontaneously  Patient is following all commands.  Patient is responsive to verbal stimuli.   Psychiatric: Patient presents with a depressed mood with flat affect.  Patient seems to possess insight as to theircurrent situation.     Labs on Admission: I have personally reviewed following labs and imaging studies -   CBC: Recent Labs  Lab 05/05/19 2134  WBC 3.9*  NEUTROABS 3.5  HGB 10.4*  HCT 31.9*  MCV 88.4  PLT [id number]   Basic Metabolic Panel: Recent Labs  Lab 05/05/19 2134  NA 136  K 3.4*  CL 103  CO2 20*  GLUCOSE 253*  BUN 40*  CREATININE 2.64*  CALCIUM 9.4   GFR: Estimated Creatinine Clearance: 18.5 mL/min (A) (by C-G formula based on SCr of 2.64 mg/dL (H)). Liver Function Tests: Recent Labs  Lab 05/05/19 2134  AST 117*  ALT 60*  ALKPHOS 102  BILITOT 0.7  PROT 6.5  ALBUMIN 3.8   No results for input(s): LIPASE, AMYLASE in the last 168 hours. No results for input(s): AMMONIA in the last 168 hours. Coagulation Profile: Recent Labs  Lab 05/05/19 2134  INR 1.1   Cardiac Enzymes: No results for input(s): CKTOTAL, CKMB, CKMBINDEX, TROPONINI in the last 168 hours. BNP (last 3 results) No results for input(s): PROBNP in the last 8760 hours. HbA1C: No results for input(s): HGBA1C in the last 72 hours. CBG: No results for input(s): GLUCAP in the last 168 hours. Lipid Profile: No results for input(s): CHOL, HDL, LDLCALC, TRIG, CHOLHDL, LDLDIRECT in the last 72 hours. Thyroid Function Tests: No results for input(s): TSH, T4TOTAL, FREET4, T3FREE, THYROIDAB in the last 72 hours. Anemia Panel: No results  for input(s): VITAMINB12, FOLATE, FERRITIN, TIBC, IRON, RETICCTPCT in the last 72 hours. Urine analysis: No results found for: COLORURINE, APPEARANCEUR, LABSPEC, [place], GLUCOSEU, HGBUR, BILIRUBINUR, KETONESUR, PROTEINUR, UROBILINOGEN, NITRITE, LEUKOCYTESUR  Radiological Exams on Admission - Personally Reviewed: CT ABDOMEN PELVIS WO CONTRAST  Result Date: 05/06/2019 CLINICAL DATA:  Persistent cough, lethargy and chills, abdominal distension and right flank pain with acute kidney injury and sepsis EXAM: CT CHEST, ABDOMEN AND PELVIS WITHOUT CONTRAST TECHNIQUE: Multidetector CT imaging of the chest, abdomen and pelvis was performed following the standard protocol without IV contrast. COMPARISON:  Chest radiograph 05/05/2019 FINDINGS: CT CHEST FINDINGS Cardiovascular: Luminal evaluation of vasculature precluded in the absence of contrast media. Hypoattenuation of the cardiac blood pool relative to the myocardium suggests a relative anemia. Normal cardiac size. No pericardial effusion. Calcifications are present on the mitral annulus. Few coronary artery calcifications are present. Suspect calcification of the aortic leaflets as well. Atherosclerotic plaque within the normal caliber aorta. An shared origin of the brachiocephalic and left common carotid artery. Minimal calcification in the proximal great vessels. Central pulmonary arteries are normal caliber. Mediastinum/Nodes:  No mediastinal fluid or gas. Diminutive appearance of the right thyroid lobe, could reflect prior thyroid surgery. Thyroid gland and thoracic inlet are otherwise unremarkable. No acute abnormality of the trachea. Small sliding-type hiatal hernia. No worrisome mediastinal or axillary adenopathy. Hilar nodal evaluation is limited in the absence of intravenous contrast media. Lungs/Pleura: Coronal imaging demonstrates mild cephalad redistribution of the pulmonary vascularity with interlobular septal thickening towards the apices and lung bases.  No focal consolidation, pneumothorax or effusion. Dependent areas of atelectasis are present with additional bandlike opacities in the right middle lobe and both lung bases compatible with scarring and or atelectasis. No concerning pulmonary nodules or masses. Musculoskeletal: No chest wall mass or suspicious bone lesions identified. Multilevel degenerative changes are present in the imaged portions of the spine. Minimal degenerative changes in bilateral glenohumeral joints. CT ABDOMEN PELVIS FINDINGS Hepatobiliary: Diffuse hepatic hypoattenuation compatible with hepatic steatosis. Focal fatty sparing along the gallbladder fossa. Normal gallbladder. No visible calcified gallstones. No biliary ductal dilatation. Pancreas: Unremarkable. No pancreatic ductal dilatation or surrounding inflammatory changes. Spleen: Small calcification in the splenic hilum could reflect a calcified granuloma or vascular calcification. No worrisome splenic lesion. Normal splenic size. 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 Additional mild nonspecific thickening of the left adrenal gland without concerning dominant nodule. The kidneys are orthotopic. Bilateral fluid attenuation renal cysts are present, in the right kidney measures up to 3.8 cm, in the left measuring up to 2.4 cm. No concerning renal masses. There is bilateral perinephric stranding which is slightly asymmetrically increased on the left with mild pelviectasis and ureterectasis to the level of the urinary bladder. A punctate radiodensity is seen along the left posterolateral margin of the urinary bladder separate from the UV junction though could reflect a recently passed calculus. Additional nonobstructing calculus versus vascular calcifications seen in the lower pole left kidney as well. Stomach/Bowel: Small hiatal hernia, as above. Distal stomach and  duodenum are unremarkable with normal course of the duodenum across the midline abdomen. No small bowel dilatation or wall thickening. A normal appendix is visualized. No colonic dilatation or wall thickening. Scattered colonic diverticula without focal pericolonic inflammation to suggest diverticulitis. No evidence of mechanical bowel obstruction. Vascular/Lymphatic: Atherosclerotic plaque within the normal caliber aorta. No pathologically enlarged abdominopelvic lymph nodes. Reproductive: Anteverted uterus with partially calcified anterior lobular probable fibroid measuring up to 6 cm in size. No concerning adnexal lesions. Other: No abdominopelvic free fluid or free gas. No bowel containing hernias. Small fat containing umbilical hernia. Musculoskeletal: Mild levocurvature of the lumbar spine, apex L4. Multilevel degenerative changes are present in the imaged portions of the spine. Additional degenerative changes at the SI joints, bilateral hips and symphysis pubis. No acute osseous abnormality or suspicious osseous lesion. IMPRESSION: 1. Features of vascular congestion with early/mild interstitial edema. No other acute intrathoracic abnormality. 2. Punctate radiodensity in the left posterolateral bladder, separate from the left UVJ. Could reflect a recently passed calculus with mild left pelviectasis and ureterectasis with some asymmetric left renal stranding. 3. Additional nonobstructing calculus versus vascular calcifications in the lower pole left kidney as well. 4. Hepatic steatosis. 5. 1.6 cm low-attenuation nodule in the right adrenal gland most compatible with benign adenoma. 6. Colonic diverticulosis without evidence of diverticulitis. 7. Fibroid uterus. 8. Small sliding-type hiatal hernia. 9. Hypoattenuation of the cardiac blood pool suggests anemia. 10. Aortic Atherosclerosis (ICD10-I70.0). Electronically Signed   By: March Rummage  [facility] M.D.   On: 05/06/2019 00:55   DG Chest 2 View  Result Date:  05/05/2019 CLINICAL DATA:  Lethargy and chills. EXAM: CHEST - 2 VIEW COMPARISON:  None. FINDINGS: Very mild, diffusely increased lung markings are seen without evidence of acute infiltrate, pleural effusion or pneumothorax. The heart size and mediastinal contours are within normal limits. Very mild prominence of the right hilum is seen. The visualized skeletal structures are unremarkable. IMPRESSION: 1. Very mild, diffusely increased lung markings which are likely chronic in nature. 2. Mild prominence of the right hilum which may be vascular in origin. Mild lymphadenopathy cannot be excluded. Electronically Signed   By: Virgina Norfolk M.D.   On: 05/05/2019 22:32   CT Chest Wo Contrast  Result Date: 05/06/2019 CLINICAL DATA:  Persistent cough, lethargy and chills, abdominal distension and right flank pain with acute kidney injury and sepsis EXAM: CT CHEST, ABDOMEN AND PELVIS WITHOUT CONTRAST TECHNIQUE: Multidetector CT imaging of the chest, abdomen and pelvis was performed following the standard protocol without IV contrast. COMPARISON:  Chest radiograph 05/05/2019 FINDINGS: CT CHEST FINDINGS Cardiovascular: Luminal evaluation of vasculature precluded in the absence of contrast media. Hypoattenuation of the cardiac blood pool relative to the myocardium suggests a relative anemia. Normal cardiac size. No pericardial effusion. Calcifications are present on the mitral annulus. Few coronary artery calcifications are present. Suspect calcification of the aortic leaflets as well. Atherosclerotic plaque within the normal caliber aorta. An shared origin of the brachiocephalic and left common carotid artery. Minimal calcification in the proximal great vessels. Central pulmonary arteries are normal caliber. Mediastinum/Nodes: No mediastinal fluid or gas. Diminutive appearance of the right thyroid lobe, could reflect prior thyroid surgery. Thyroid gland and thoracic inlet are otherwise unremarkable. No acute abnormality of  the trachea. Small sliding-type hiatal hernia. No worrisome mediastinal or axillary adenopathy. Hilar nodal evaluation is limited in the absence of intravenous contrast media. Lungs/Pleura: Coronal imaging demonstrates mild cephalad redistribution of the pulmonary vascularity with interlobular septal thickening towards the apices and lung bases. No focal consolidation, pneumothorax or effusion. Dependent areas of atelectasis are present with additional bandlike opacities in the right middle lobe and both lung bases compatible with scarring and or atelectasis. No concerning pulmonary nodules or masses. Musculoskeletal: No chest wall mass or suspicious bone lesions identified. Multilevel degenerative changes are present in the imaged portions of the spine. Minimal degenerative changes in bilateral glenohumeral joints. CT ABDOMEN PELVIS FINDINGS Hepatobiliary: Diffuse hepatic hypoattenuation compatible with hepatic steatosis. Focal fatty sparing along the gallbladder fossa. Normal gallbladder. No visible calcified gallstones. No biliary ductal dilatation. Pancreas: Unremarkable. No pancreatic ductal dilatation or surrounding inflammatory changes. Spleen: Small calcification in the splenic hilum could reflect a calcified granuloma or vascular calcification. No worrisome splenic lesion. Normal splenic size. 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 Additional mild nonspecific thickening of the left adrenal gland without concerning dominant nodule. The kidneys are orthotopic. Bilateral fluid attenuation renal cysts are present, in the right kidney measures up to 3.8 cm, in the left measuring up to 2.4 cm. No concerning renal masses. There is bilateral perinephric stranding which is slightly asymmetrically increased on the left with mild pelviectasis and ureterectasis to the level of the urinary bladder.  A punctate radiodensity is seen along the left posterolateral margin of the urinary bladder separate from the UV junction though could reflect a recently passed calculus. Additional nonobstructing calculus versus vascular  calcifications seen in the lower pole left kidney as well. Stomach/Bowel: Small hiatal hernia, as above. Distal stomach and duodenum are unremarkable with normal course of the duodenum across the midline abdomen. No small bowel dilatation or wall thickening. A normal appendix is visualized. No colonic dilatation or wall thickening. Scattered colonic diverticula without focal pericolonic inflammation to suggest diverticulitis. No evidence of mechanical bowel obstruction. Vascular/Lymphatic: Atherosclerotic plaque within the normal caliber aorta. No pathologically enlarged abdominopelvic lymph nodes. Reproductive: Anteverted uterus with partially calcified anterior lobular probable fibroid measuring up to 6 cm in size. No concerning adnexal lesions. Other: No abdominopelvic free fluid or free gas. No bowel containing hernias. Small fat containing umbilical hernia. Musculoskeletal: Mild levocurvature of the lumbar spine, apex L4. Multilevel degenerative changes are present in the imaged portions of the spine. Additional degenerative changes at the SI joints, bilateral hips and symphysis pubis. No acute osseous abnormality or suspicious osseous lesion. IMPRESSION: 1. Features of vascular congestion with early/mild interstitial edema. No other acute intrathoracic abnormality. 2. Punctate radiodensity in the left posterolateral bladder, separate from the left UVJ. Could reflect a recently passed calculus with mild left pelviectasis and ureterectasis with some asymmetric left renal stranding. 3. Additional nonobstructing calculus versus vascular calcifications in the lower pole left kidney as well. 4. Hepatic steatosis. 5. 1.6 cm low-attenuation nodule in the right adrenal gland most compatible with  benign adenoma. 6. Colonic diverticulosis without evidence of diverticulitis. 7. Fibroid uterus. 8. Small sliding-type hiatal hernia. 9. Hypoattenuation of the cardiac blood pool suggests anemia. 10. Aortic Atherosclerosis (ICD10-I70.0). Electronically Signed   By: Lovena Le M.D.   On: 05/06/2019 00:55    EKG: Personally reviewed.  Rhythm is sinus tachycardia with heart rate of 139 bpm.  No dynamic ST segment changes appreciated.  Assessment/Plan Principal Problem:   Severe sepsis with acute organ dysfunction due to gram-negative bacteria (HCC)   Multiple SIRS criteria noted with concurrent severe lactic acidosis and acute kidney injury suggestive of organ dysfunction.  Patient is likely suffering from sepsis secondary to left-sided pyelonephritis secondary to retained left ureteral stone.  While CT imaging does suggest that the stone may have passed into the left bladder, upon discussion with urology they do not believe that there is convincing CT evidence of this and feel it is indicated to take the patient to the operating room to visualize whether there is still retained obstructive stone.  Patient is currently n.p.o. in preparation for urologic procedure  Continuing to hydrate patient aggressively with intravenous isotonic fluids  Continue patient on intravenous Zosyn.  Blood and urine cultures have been ordered and are pending  Admitting patient to stepdown unit, clinical course is tenuous at this time.  Active Problems:   AKI (acute kidney injury) ([place])   Substantial acute kidney injury from presumably secondary to sepsis and left-sided pyelonephritis  Hydrating patient with intravenous isotonic fluid  Urology to take patient to operating room this morning to determine if there is still a persisting left ureteral obstructing stone  Strict input and output monitoring  Monitor renal function and electrolytes with serial chemistries.    Lactic acidosis   Severe  lactic acidosis likely secondary to volume depletion and sepsis  Hydrating patient intravenously with isotonic fluid  Performing serial lactic acid levels to ensure downtrending and resolution of lactic acidosis  Broad-spectrum antibiotic therapy    Acute pyelonephritis   Please see assessment and plan above    Essential hypertension   Temporarily holding home regimen of scheduled antihypertensive  therapy  As needed antihypertensive regimen has been ordered in case of excessively elevated blood pressures.    Uncontrolled type 2 diabetes mellitus with hyperglycemia, without long-term current use of insulin ([place])   Patient presented with substantial hyperglycemia  Obtaining hemoglobin A1c  Placing patient on Accu-Cheks before every meal and nightly with sliding scale insulin  Will initiate basal bolus insulin therapy if sugars are persistently elevated  Holding home regimen of oral hypoglycemics at this time    Hypokalemia, inadequate intake   Will replace with intravenous potassium chloride  Monitoring potassium levels with serial chemistry    Adrenal adenoma, right   Incidental finding on CT imaging  Appearance is benign  Outpatient follow-up   Code Status:  Full code Family Communication: Husband is at the bedside and has been updated on plan of care  Status is: Inpatient  Remains inpatient appropriate because:Ongoing diagnostic testing needed not appropriate for outpatient work up, IV treatments appropriate due to intensity of illness or inability to take PO and Inpatient level of care appropriate due to severity of illness   Dispo: The patient is from: Home              Anticipated d/c is to: Home              Anticipated d/c date is: > 3 days              Patient currently is not medically stable to d/c.         Vernelle Emerald MD [facility] Pager [phone]  If 7PM-7AM, please contact night-coverage [email or web address] Use  universal [place] password for that web site. If you do not have the password, please call the hospital operator.  05/06/2019, 2:43 AM

## 2019-05-07 ENCOUNTER — Encounter: Payer: Self-pay | Admitting: *Deleted

## 2019-05-07 DIAGNOSIS — E538 Deficiency of other specified B group vitamins: Secondary | ICD-10-CM | POA: Diagnosis present

## 2019-05-07 DIAGNOSIS — N390 Urinary tract infection, site not specified: Secondary | ICD-10-CM

## 2019-05-07 DIAGNOSIS — B9620 Unspecified Escherichia coli [E. coli] as the cause of diseases classified elsewhere: Secondary | ICD-10-CM

## 2019-05-07 DIAGNOSIS — R7881 Bacteremia: Secondary | ICD-10-CM | POA: Diagnosis present

## 2019-05-07 DIAGNOSIS — D509 Iron deficiency anemia, unspecified: Secondary | ICD-10-CM | POA: Diagnosis present

## 2019-05-07 DIAGNOSIS — N201 Calculus of ureter: Secondary | ICD-10-CM | POA: Diagnosis present

## 2019-05-07 DIAGNOSIS — N1330 Unspecified hydronephrosis: Secondary | ICD-10-CM

## 2019-05-07 HISTORY — DX: Urinary tract infection, site not specified: N39.0

## 2019-05-07 LAB — CBC WITH DIFFERENTIAL/PLATELET
Abs Immature Granulocytes: 1.69 10*3/uL — ABNORMAL HIGH (ref 0.00–0.07)
Basophils Absolute: 0.1 10*3/uL (ref 0.0–0.1)
Basophils Relative: 0 %
Eosinophils Absolute: 0 10*3/uL (ref 0.0–0.5)
Eosinophils Relative: 0 %
HCT: 27.9 % — ABNORMAL LOW (ref 36.0–46.0)
Hemoglobin: 8.6 g/dL — ABNORMAL LOW (ref 12.0–15.0)
Immature Granulocytes: 8 %
Lymphocytes Relative: 6 %
Lymphs Abs: 1.2 10*3/uL (ref 0.7–4.0)
MCH: 27.8 pg (ref 26.0–34.0)
MCHC: 30.8 g/dL (ref 30.0–36.0)
MCV: 90.3 fL (ref 80.0–100.0)
Monocytes Absolute: 1.5 10*3/uL — ABNORMAL HIGH (ref 0.1–1.0)
Monocytes Relative: 7 %
Neutro Abs: 16.4 10*3/uL — ABNORMAL HIGH (ref 1.7–7.7)
Neutrophils Relative %: 79 %
Platelets: 209 10*3/uL (ref 150–400)
RBC: 3.09 MIL/uL — ABNORMAL LOW (ref 3.87–5.11)
RDW: 15.2 % (ref 11.5–15.5)
WBC: 20.9 10*3/uL — ABNORMAL HIGH (ref 4.0–10.5)
nRBC: 0 % (ref 0.0–0.2)

## 2019-05-07 LAB — COMPREHENSIVE METABOLIC PANEL
ALT: 49 U/L — ABNORMAL HIGH (ref 0–44)
AST: 46 U/L — ABNORMAL HIGH (ref 15–41)
Albumin: 2.9 g/dL — ABNORMAL LOW (ref 3.5–5.0)
Alkaline Phosphatase: 55 U/L (ref 38–126)
Anion gap: 9 (ref 5–15)
BUN: 34 mg/dL — ABNORMAL HIGH (ref 8–23)
CO2: 21 mmol/L — ABNORMAL LOW (ref 22–32)
Calcium: 8.4 mg/dL — ABNORMAL LOW (ref 8.9–10.3)
Chloride: 112 mmol/L — ABNORMAL HIGH (ref 98–111)
Creatinine, Ser: 1.68 mg/dL — ABNORMAL HIGH (ref 0.44–1.00)
GFR calc Af Amer: 34 mL/min — ABNORMAL LOW (ref >60)
GFR calc non Af Amer: 29 mL/min — ABNORMAL LOW (ref >60)
Glucose, Bld: 117 mg/dL — ABNORMAL HIGH (ref 70–99)
Potassium: 4.1 mmol/L (ref 3.5–5.1)
Sodium: 142 mmol/L (ref 135–145)
Total Bilirubin: 0.7 mg/dL (ref 0.3–1.2)
Total Protein: 5.8 g/dL — ABNORMAL LOW (ref 6.5–8.1)

## 2019-05-07 LAB — LACTIC ACID, PLASMA: Lactic Acid, Venous: 1.5 mmol/L (ref 0.5–1.9)

## 2019-05-07 LAB — GLUCOSE, CAPILLARY
Glucose-Capillary: 176 mg/dL — ABNORMAL HIGH (ref 70–99)
Glucose-Capillary: 114 mg/dL — ABNORMAL HIGH (ref 70–99)
Glucose-Capillary: 124 mg/dL — ABNORMAL HIGH (ref 70–99)
Glucose-Capillary: 150 mg/dL — ABNORMAL HIGH (ref 70–99)
Glucose-Capillary: 119 mg/dL — ABNORMAL HIGH (ref 70–99)

## 2019-05-07 LAB — PHOSPHORUS: Phosphorus: 2.6 mg/dL (ref 2.5–4.6)

## 2019-05-07 LAB — URINE CULTURE: Culture: NO GROWTH

## 2019-05-07 LAB — MAGNESIUM: Magnesium: 2.3 mg/dL (ref 1.7–2.4)

## 2019-05-07 LAB — PROCALCITONIN: Procalcitonin: >150 ng/mL

## 2019-05-07 MED ORDER — NAPHAZOLINE-GLYCERIN 0.012-0.2 % OP SOLN
2.0000 [drp] | Freq: Three times a day (TID) | OPHTHALMIC | Status: DC
  Administered 2019-05-07 – 2019-05-10 (×9): 2 [drp] via OPHTHALMIC
  Filled 2019-05-07: qty 15

## 2019-05-07 MED ORDER — SODIUM CHLORIDE 0.45 % IV SOLN: INTRAVENOUS | Status: DC

## 2019-05-07 MED ORDER — ENOXAPARIN SODIUM 40 MG/0.4ML ~~LOC~~ SOLN
40.0000 mg | SUBCUTANEOUS | Status: DC
  Administered 2019-05-07 – 2019-05-09 (×3): 40 mg via SUBCUTANEOUS
  Filled 2019-05-07 (×3): qty 0.4

## 2019-05-07 MED ORDER — FOLIC ACID 1 MG PO TABS
1.0000 mg | ORAL_TABLET | Freq: Every day | ORAL | Status: DC
  Administered 2019-05-07 – 2019-05-10 (×4): 1 mg via ORAL
  Filled 2019-05-07 (×4): qty 1

## 2019-05-07 NOTE — Evaluation (Signed)
Physical Therapy Evaluation Patient Details Name: Diane Wilkerson MRN: [id number] DOB: 04-06-1940 Today's Date: 05/07/2019   History of Present Illness  Patient is 79 y.o. female with history of sarcoidosis, diabetes, hypertension, who presented with left flank discomfort and fever with a CT scan that demonstrated a 5 mm UVJ obstructing stone with associated left hydroureteronephrosis. Infected obstructing stone s/p left ureteroscopic basket extraction and stent placement on 05/06/19. Cultures returned positive for e. coli.     Clinical Impression  Diane Wilkerson is 79 y.o. female admitted with above HPI and diagnosis. Patient is currently limited by functional impairments below (see PT problem list). Patient lives with her husband and is independent at baseline. Patient will benefit from continued skilled PT interventions to address impairments and progress independence with mobility, recommending OPPT follow up if pt feels she remains weak or unsteady once medically ready to return home. Acute PT will follow and progress as able.      Follow Up Recommendations Outpatient PT    Equipment Recommendations  None recommended by PT    Recommendations for Other Services       Precautions / Restrictions Precautions Precautions: Fall Restrictions Weight Bearing Restrictions: No      Mobility  Bed Mobility      General bed mobility comments: pt in recliner at start of session.   Transfers Overall transfer level: Needs assistance Equipment used: Rolling walker (2 wheeled) Transfers: Sit to/from Stand Sit to Stand: Min guard         General transfer comment: pt able to initiate power up from recliner wtih bil UE use. min guard for safety. pt usign grab bar in bathroom to control descent to toilet and to assist with power up.   Ambulation/Gait Ambulation/Gait assistance: Min guard Gait Distance (Feet): 60 Feet Assistive device: IV Pole Gait Pattern/deviations: Step-through  pattern;Decreased stride length;Decreased step length - left;Decreased step length - right Gait velocity: decreased   General Gait Details: cues for pausing for rest breaks with gait due to SOB and tachycardia. Pt slightly unsteady but use of IV improved balance and no overt LOB noted. pt using 1 UE on IV pole. Pt's Hr reached max of 136 bpm with gait and remained mostly in 120's. SpO2 remained in 90's wtih low of 88% on RA during gait. Cues for pursed lip breathing and rest break improved SpO2 to >90%. RR also noted to incresaed to max of 38 on monitor and pt reported SOB, breathing exercises reduced rate to 14-28 during gait.   Stairs            Wheelchair Mobility    Modified Rankin (Stroke Patients Only)       Balance Overall balance assessment: Mild deficits observed, not formally tested            Pertinent Vitals/Pain Pain Assessment: No/denies pain    Home Living Family/patient expects to be discharged to:: Private residence Living Arrangements: Spouse/significant other Available Help at Discharge: Family Type of Home: House Home Access: Stairs to enter Entrance Stairs-Rails: Can reach both(back has 2 railings) Entrance Stairs-Number of Steps: 14(from front or back to get to main level. ) Home Layout: One level;Laundry or work area in [place]: Kasandra Knudsen - single point;Grab bars - tub/shower      Prior Function Level of Independence: Independent           Hand Dominance   Dominant Hand: Right    Extremity/Trunk Assessment   Upper Extremity Assessment Upper Extremity Assessment:  Overall [facility] for tasks assessed    Lower Extremity Assessment Lower Extremity Assessment: Overall WFL for tasks assessed    Cervical / Trunk Assessment Cervical / Trunk Assessment: Normal  Communication   Communication: No difficulties  Cognition Arousal/Alertness: Awake/alert Behavior During Therapy: WFL for tasks assessed/performed Overall Cognitive Status:  Within Functional Limits for tasks assessed           General Comments      Exercises     Assessment/Plan    PT Assessment Patient needs continued PT services  PT Problem List Decreased strength;Decreased activity tolerance;Decreased balance;Decreased mobility;Decreased knowledge of use of DME;Obesity       PT Treatment Interventions DME instruction;Gait training;Stair training;Functional mobility training;Therapeutic activities;Balance training;Therapeutic exercise;Patient/family education    PT Goals (Current goals can be found in the Care Plan section)  Acute Rehab PT Goals Patient Stated Goal: to get back home and independent PT Goal Formulation: With patient Time For Goal Achievement: 05/21/19 Potential to Achieve Goals: Good    Frequency Min 3X/week    AM-PAC PT "6 Clicks" Mobility  Outcome Measure Help needed turning from your back to your side while in a flat bed without using bedrails?: A Little Help needed moving from lying on your back to sitting on the side of a flat bed without using bedrails?: A Little Help needed moving to and from a bed to a chair (including a wheelchair)?: A Little Help needed standing up from a chair using your arms (e.g., wheelchair or bedside chair)?: A Little Help needed to walk in hospital room?: A Little Help needed climbing 3-5 steps with a railing? : A Little 6 Click Score: 18    End of Session Equipment Utilized During Treatment: Gait belt Activity Tolerance: Patient tolerated treatment well Patient left: in chair;with call bell/phone within reach;with family/visitor present Nurse Communication: Mobility status PT Visit Diagnosis: Unsteadiness on feet (R26.81);Difficulty in walking, not elsewhere classified (R26.2)    Time: [id number] PT Time Calculation (min) (ACUTE ONLY): 29 min   Charges:   PT Evaluation $PT Eval Moderate Complexity: 1 Mod PT Treatments $Gait Training: 8-22 mins        Verner Mould, DPT Physical  Therapist with [facility] [phone]  05/07/2019 1:54 PM

## 2019-05-07 NOTE — Progress Notes (Addendum)
PROGRESS NOTE    Diane Wilkerson  [id number] DOB: May 16, 1940 DOA: 05/05/2019 PCP: Willey Blade, MD    Chief Complaint  Patient presents with  . Fever    Brief Narrative:  Patient is a pleasant 79 year old female history of diabetes type 2, pulmonary sarcoidosis, hypertension presented to the ED with left flank pain and fever.  Patient noted to have a temperature at home as high as 101.  Patient seen in the ED CT abdomen and pelvis showing evidence of left-sided pyelonephritis with concerns for a passed calculus near UVJ in the left bladder.  Patient noted to be septic with a severe lactic acidosis, acute renal failure suggestive of organ dysfunction, left-sided pyelonephritis secondary to retained left ureteral stone.  Urology was consulted and patient taken emergently to the OR and underwent cystoscopy stone extraction, left ureteral stent placement.  Patient noted to have left hydronephrosis as well.  Patient started empirically on broad-spectrum IV antibiotics and awaiting placement in the stepdown unit.   Assessment & Plan:   Principal Problem:   Severe sepsis with acute organ dysfunction due to gram-negative bacteria (HCC) Active Problems:   Acute pyelonephritis   E coli bacteremia   Complicated UTI (urinary tract infection)   Essential hypertension   Uncontrolled type 2 diabetes mellitus with hyperglycemia, without long-term current use of insulin (HCC)   AKI (acute kidney injury) (HCC)   Lactic acidosis   Hypokalemia, inadequate intake   Adrenal adenoma, right   Left ureteral stone   Hydronephrosis, left   Folate deficiency   Iron deficiency anemia  1 severe sepsis with acute organ dysfunction secondary to acute left pyelonephritis, complicated UTI with left distal ureteral stone and left hydronephrosis. Patient admitted meeting criteria for sepsis with acute organ dysfunction.  Patient on admission noted to have a severe lactic acidosis, acute renal failure,  left-sided pyelonephritis likely secondary to retained left ureteral stone, fever of 103.1, noted to be tachycardic with heart rate of 137, tachypneic with respiratory rate of 28 and initial presentation with a blood pressure of 96/58.  CT abdomen and pelvis done suggestive that stone may have passed into the left bladder.  Admitting physician discussed with urology and was for the was not convincing CT evidence that stone may have passed and patient was taken emergently to the OR.  Patient underwent cystoscopy, left basket extraction of ureteral stone, left retrograde pyelogram with interpretation, left ureteral stent placement and tolerated procedure well.  Patient currently in the PACU.  Patient pancultured.  Blood culture positive for E. coli bacteremia.  Urine cultures pending with preliminary with 40,000 colonies of gram-negative rods..  Lactic acidosis trending down.  Procalcitonin elevated at > 150. Patient received a dose of IV Rocephin and IV azithromycin in the ED.  Patient received a dose of IV vancomycin and  IV Zosyn (05/06/2019).  IV antibiotics have been narrowed down to IV Rocephin.  Per urology leave Foley catheter in place.  Urology following and appreciate input and recommendations.  2.  E. coli bacteremia Likely seeded from complicated UTI/left pyelonephritis.  IV antibiotics were narrowed down from IV Zosyn and patient currently on IV Rocephin.  Will likely need a 2-week course of antibiotic treatment.  Follow.  3.  Acute renal failure Likely secondary to a post renal azotemia secondary to hydronephrosis in the setting of ureteral stone status post stone extraction and stent placement in the setting of sepsis secondary to pyelonephritis/complicated UTI and on chronic diuretic and ARB.Marland Kitchen  Per urology on review  of records from care everywhere notes from January 2021 reporting a creatinine of 1.8, 1.0 in September 2020, 0.9 in June 2020.  Creatinine on admission was 2.64.  Patient with renal  function improving with creatinine currently at 1.68 from 2.67 from 2.64.  Patient also noted to have a borderline blood pressure with systolics in the 0000000 on admission and noted to be septic.  Urinalysis obtained on admission cloudy, moderate leukocytes, nitrite negative, 21-50 WBCs, many bacteria.  Urine cultures pending.  Urine studies pending.  Continue IV fluids.  Monitor urine output.  Continue to hold ARB and diuretic. Follow.  4.  Hypokalemia/hypomagnesemia Potassium at 4.1 this morning.  Magnesium at 2.3.   5.  Transaminitis Likely secondary to acute infection and dehydration.  LFTs trending down.  Follow.  6.  Iron deficiency anemia/folate deficiency Likely dilutional.  Repeat hemoglobin this morning at 8.6 from 8.9 from 10.4 on admission.  Anemia panel obtained with a iron level of 7, ferritin 183, folate of 5.1.  Placed on folic acid 1 mg p.o. daily.  Follow H&H.  May need IV iron during this hospitalization however will defer for now.  Transfusion threshold hemoglobin < 7.  7.  Right adrenal adenoma Incidental finding on CT imaging that appears benign.  Outpatient follow-up.  8.  Hypertension Continue to hold antihypertensive medications at this time.  9.  Lactic acidosis Patient with a severe lactic acidosis likely secondary to sepsis and volume depletion.  Lactic acid levels slowly trending down.  Decrease IV fluid rate.  Continue empiric IV antibiotics.  Follow.   10.  Type 2 diabetes mellitus Hemoglobin A1c 7.1 (05/05/2019).  CBG of 114.  Carb modified diet.  Sliding scale insulin.  Continue to hold oral hypoglycemic agents.    11.  History of pulmonary sarcoidosis Currently stable.  Outpatient follow-up.  12.  Sinus tachycardia Likely secondary to problem #1, E. coli bacteremia.  Continue empiric IV antibiotics, hydration.  Follow.    DVT prophylaxis: SCDs Code Status: Full Family Communication: Updated patient.  No family at bedside. Disposition:   Status is:  Inpatient    Dispo: The patient is from: Home              Anticipated d/c is to: Likely home              Anticipated d/c date is: To be determined.              Patient currently in stepdown unit on IV antibiotics for bacteremia, complicated UTI, acute pyelonephritis.  Patient still tachycardic.          Consultants:   Urology: Dr. Eskridge/Dr. Fredderick Phenix 05/06/2019    Procedures:   CT abdomen and pelvis 05/06/2019  CT chest 05/06/2019  Chest x-ray 05/05/2019  Cystoscopy/left basket extraction of ureteral stone/left retrograde pyelogram with interpretation/left urethral stent placement/simple Foley catheter placement per Dr. Ella Bodo 05/06/2019  Antimicrobials:   IV vancomycin x1 dose pending 05/06/2019  IV Zosyn 05/06/2019>>>>> 05/06/2019  IV azithromycin x1 dose 05/05/2019  IV Rocephin x1 dose 05/05/2019  IV Rocephin 05/06/2019   Subjective: Patient sleeping but arousable.  Denies any chest pain or shortness of breath.  States she is feeling better.  Some complaints of abdominal discomfort but no significant pain per patient.  Objective: Vitals:   05/07/19 0318 05/07/19 0400 05/07/19 0600 05/07/19 0800  BP:  (!) 124/57 (!) 113/43 (!) 116/48  Pulse:  100 (!) 101 (!) 106  Resp:  (!) 21 17 18   Temp:  98.2 F (36.8 C)     TempSrc: Oral     SpO2:  97% 98% 98%  Weight:      Height:        Intake/Output Summary (Last 24 hours) at 05/07/2019 0847 Last data filed at 05/07/2019 0600 Gross per 24 hour  Intake 2401.31 ml  Output 2975 ml  Net -573.69 ml   Filed Weights   05/05/19 2134 05/06/19 1134  Weight: 86.2 kg 98.9 kg    Examination:  General exam: NAD Respiratory system: CTA B.  No wheezes, no crackles, no rhonchi.  Cardiovascular system: Tachycardic.  No JVD, murmurs rubs or gallops.  No lower extremity edema. Gastrointestinal system: Abdomen is soft, nontender, nondistended, positive bowel sounds.  No rebound.  No guarding.  Central nervous system: Alert and  oriented. No focal neurological deficits. Extremities: Symmetric 5 x 5 power. Skin: No rashes, lesions or ulcers Psychiatry: Judgement and insight appear fair. Mood & affect appropriate.     Data Reviewed: I have personally reviewed following labs and imaging studies  CBC: Recent Labs  Lab 05/05/19 2134 05/06/19 0742 05/07/19 0343  WBC 3.9* 16.9* 20.9*  NEUTROABS 3.5 15.1* 16.4*  HGB 10.4* 8.9* 8.6*  HCT 31.9* 28.7* 27.9*  MCV 88.4 90.8 90.3  PLT 295 239 [id number]    Basic Metabolic Panel: Recent Labs  Lab 05/05/19 2134 05/06/19 0742 05/07/19 0343  NA 136 142 142  K 3.4* 4.0 4.1  CL 103 112* 112*  CO2 20* 22 21*  GLUCOSE 253* 167* 117*  BUN 40* 41* 34*  CREATININE 2.64* 2.67* 1.68*  CALCIUM 9.4 8.3* 8.4*  MG  --  1.4* 2.3  PHOS  --   --  2.6    GFR: Estimated Creatinine Clearance: 31.3 mL/min (A) (by C-G formula based on SCr of 1.68 mg/dL (H)).  Liver Function Tests: Recent Labs  Lab 05/05/19 2134 05/06/19 0742 05/07/19 0343  AST 117* 95* 46*  ALT 60* 60* 49*  ALKPHOS 102 53 55  BILITOT 0.7 0.6 0.7  PROT 6.5 5.8* 5.8*  ALBUMIN 3.8 3.2* 2.9*    CBG: Recent Labs  Lab 05/06/19 0428 05/06/19 0609 05/06/19 1720 05/06/19 2309 05/07/19 0537  GLUCAP 146* 149* 161* 125* 114*     Recent Results (from the past 240 hour(s))  Culture, blood (Routine x 2)     Status: Abnormal (Preliminary result)   Collection Time: 05/05/19  9:34 PM   Specimen: BLOOD  Result Value Ref Range Status   Specimen Description   Final    BLOOD RIGHT WRIST Performed at [facility], [place] [address] [place], [place] [address]    Special Requests   Final    BOTTLES DRAWN AEROBIC AND ANAEROBIC Blood Culture results may not be optimal due to an excessive volume of blood received in culture bottles Performed at [facility] [address] [place], [place] [address]    Culture  Setup Time   Final    GRAM NEGATIVE RODS IN BOTH AEROBIC AND  ANAEROBIC BOTTLES CRITICAL RESULT CALLED TO, READ BACK BY AND VERIFIED WITH: Otila Back [id number] AT 27 B Y CM Performed at [facility], [place] [address] [place], [place] [address]    Culture ESCHERICHIA COLI (A)  Final   Report Status PENDING  Incomplete  Blood Culture ID Panel (Reflexed)     Status: Abnormal   Collection Time: 05/05/19  9:34 PM  Result Value Ref Range Status   Enterococcus species  NOT DETECTED NOT DETECTED Final   Listeria monocytogenes NOT DETECTED NOT DETECTED Final   Staphylococcus species NOT DETECTED NOT DETECTED Final   Staphylococcus aureus (BCID) NOT DETECTED NOT DETECTED Final   Streptococcus species NOT DETECTED NOT DETECTED Final   Streptococcus agalactiae NOT DETECTED NOT DETECTED Final   Streptococcus pneumoniae NOT DETECTED NOT DETECTED Final   Streptococcus pyogenes NOT DETECTED NOT DETECTED Final   Acinetobacter baumannii NOT DETECTED NOT DETECTED Final   Enterobacteriaceae species DETECTED (A) NOT DETECTED Final    Comment: Enterobacteriaceae represent a large family of gram-negative bacteria, not a single organism. CRITICAL RESULT CALLED TO, READ BACK BY AND VERIFIED WITH: PHARMD J LEGGE PD:[id number] AT 1545 BY CM    Enterobacter cloacae complex NOT DETECTED NOT DETECTED Final   Escherichia coli DETECTED (A) NOT DETECTED Final    Comment: CRITICAL RESULT CALLED TO, READ BACK BY AND VERIFIED WITH: PHARMD J LEGGE PD:[id number] AT 1545 BY CM    Klebsiella oxytoca NOT DETECTED NOT DETECTED Final   Klebsiella pneumoniae NOT DETECTED NOT DETECTED Final   Proteus species NOT DETECTED NOT DETECTED Final   Serratia marcescens NOT DETECTED NOT DETECTED Final   Carbapenem resistance NOT DETECTED NOT DETECTED Final   Haemophilus influenzae NOT DETECTED NOT DETECTED Final   Neisseria meningitidis NOT DETECTED NOT DETECTED Final   Pseudomonas aeruginosa NOT DETECTED NOT DETECTED Final   Candida albicans NOT DETECTED NOT DETECTED Final   Candida glabrata NOT  DETECTED NOT DETECTED Final   Candida krusei NOT DETECTED NOT DETECTED Final   Candida parapsilosis NOT DETECTED NOT DETECTED Final   Candida tropicalis NOT DETECTED NOT DETECTED Final    Comment: Performed at [facility], [place]. [address] [place], [place] [address]  Culture, blood (Routine x 2)     Status: None (Preliminary result)   Collection Time: 05/05/19  9:39 PM   Specimen: BLOOD  Result Value Ref Range Status   Specimen Description   Final    BLOOD RIGHT HAND Performed at [facility] [address] [place] [address] [place], [place] [address]    Special Requests   Final    BOTTLES DRAWN AEROBIC AND ANAEROBIC Blood Culture adequate volume Performed at [facility] [address], [place], [place] [address]    Culture  Setup Time   Final    GRAM NEGATIVE RODS AEROBIC BOTTLE ONLY CRITICAL VALUE NOTED.  VALUE IS CONSISTENT WITH PREVIOUSLY REPORTED AND CALLED VALUE. Performed at [facility], [place] [address], [place], [place] [address]    Culture GRAM NEGATIVE RODS  Final   Report Status PENDING  Incomplete  Respiratory Panel by RT PCR (Flu A&B, Covid) - Nasopharyngeal Swab     Status: None   Collection Time: 05/05/19  9:41 PM   Specimen: Nasopharyngeal Swab  Result Value Ref Range Status   SARS Coronavirus 2 by RT PCR NEGATIVE NEGATIVE Final    Comment: (NOTE) SARS-CoV-2 target nucleic acids are NOT DETECTED. The SARS-CoV-2 RNA is generally detectable in upper respiratoy specimens during the acute phase of infection. The lowest concentration of SARS-CoV-2 viral copies this assay can detect is 131 copies/mL. A negative result does not preclude SARS-Cov-2 infection and should not be used as the sole basis for treatment or other patient management decisions. A negative result may occur with  improper specimen collection/handling, submission of specimen other than nasopharyngeal swab, presence of viral mutation(s) within the areas targeted by  this assay, and inadequate number of viral copies (<  131 copies/mL). A negative result must be combined with clinical observations, patient history, and epidemiological information. The expected result is Negative. Fact Sheet for Patients:  PinkCheek.be Fact Sheet for Healthcare Providers:  GravelBags.it This test is not yet ap proved or cleared by the [place] FDA and  has been authorized for detection and/or diagnosis of SARS-CoV-2 by FDA under an Emergency Use Authorization (EUA). This EUA will remain  in effect (meaning this test can be used) for the duration of the COVID-19 declaration under Section 564(b)(1) of the Act, 21 U.S.C. section 360bbb-3(b)(1), unless the authorization is terminated or revoked sooner.    Influenza A by PCR NEGATIVE NEGATIVE Final   Influenza B by PCR NEGATIVE NEGATIVE Final    Comment: (NOTE) The Xpert Xpress SARS-CoV-2/FLU/RSV assay is intended as an aid in  the diagnosis of influenza from Nasopharyngeal swab specimens and  should not be used as a sole basis for treatment. Nasal washings and  aspirates are unacceptable for Xpert Xpress SARS-CoV-2/FLU/RSV  testing. Fact Sheet for Patients: PinkCheek.be Fact Sheet for Healthcare Providers: GravelBags.it This test is not yet approved or cleared by the [place] FDA and  has been authorized for detection and/or diagnosis of SARS-CoV-2 by  FDA under an Emergency Use Authorization (EUA). This EUA will remain  in effect (meaning this test can be used) for the duration of the  Covid-19 declaration under Section 564(b)(1) of the Act, 21  U.S.C. section 360bbb-3(b)(1), unless the authorization is  terminated or revoked. Performed at [facility], [place] [address] [place], [place] [address]   Urine Culture     Status: None   Collection Time: 05/06/19  4:06 AM    Specimen: Urine, Cystoscope  Result Value Ref Range Status   Specimen Description   Final    URINE, RANDOM Performed at [facility] [address] [place], [place] [address]    Special Requests   Final    NONE Performed at [facility], [place] [address], [place], [place] [address]    Culture   Final    NO GROWTH Performed at [facility], [place] [address], [place], [place] [address]    Report Status 05/07/2019 FINAL  Final  MRSA PCR Screening     Status: None   Collection Time: 05/06/19 11:36 AM   Specimen: Nasal Mucosa; Nasopharyngeal  Result Value Ref Range Status   MRSA by PCR NEGATIVE NEGATIVE Final    Comment:        The GeneXpert MRSA Assay (FDA approved for NASAL specimens only), is one component of a comprehensive MRSA colonization surveillance program. It is not intended to diagnose MRSA infection nor to guide or monitor treatment for MRSA infections. Performed at [facility], [place] [address], [place], [place] [address]          Radiology Studies: CT ABDOMEN PELVIS WO CONTRAST  Result Date: 05/06/2019 CLINICAL DATA:  Persistent cough, lethargy and chills, abdominal distension and right flank pain with acute kidney injury and sepsis EXAM: CT CHEST, ABDOMEN AND PELVIS WITHOUT CONTRAST TECHNIQUE: Multidetector CT imaging of the chest, abdomen and pelvis was performed following the standard protocol without IV contrast. COMPARISON:  Chest radiograph 05/05/2019 FINDINGS: CT CHEST FINDINGS Cardiovascular: Luminal evaluation of vasculature precluded in the absence of contrast media. Hypoattenuation of the cardiac blood pool relative to the myocardium suggests a relative anemia. Normal cardiac size. No pericardial effusion. Calcifications are present on the mitral annulus. Few coronary  artery calcifications are present. Suspect calcification of the aortic leaflets as well. Atherosclerotic plaque within the normal  caliber aorta. An shared origin of the brachiocephalic and left common carotid artery. Minimal calcification in the proximal great vessels. Central pulmonary arteries are normal caliber. Mediastinum/Nodes: No mediastinal fluid or gas. Diminutive appearance of the right thyroid lobe, could reflect prior thyroid surgery. Thyroid gland and thoracic inlet are otherwise unremarkable. No acute abnormality of the trachea. Small sliding-type hiatal hernia. No worrisome mediastinal or axillary adenopathy. Hilar nodal evaluation is limited in the absence of intravenous contrast media. Lungs/Pleura: Coronal imaging demonstrates mild cephalad redistribution of the pulmonary vascularity with interlobular septal thickening towards the apices and lung bases. No focal consolidation, pneumothorax or effusion. Dependent areas of atelectasis are present with additional bandlike opacities in the right middle lobe and both lung bases compatible with scarring and or atelectasis. No concerning pulmonary nodules or masses. Musculoskeletal: No chest wall mass or suspicious bone lesions identified. Multilevel degenerative changes are present in the imaged portions of the spine. Minimal degenerative changes in bilateral glenohumeral joints. CT ABDOMEN PELVIS FINDINGS Hepatobiliary: Diffuse hepatic hypoattenuation compatible with hepatic steatosis. Focal fatty sparing along the gallbladder fossa. Normal gallbladder. No visible calcified gallstones. No biliary ductal dilatation. Pancreas: Unremarkable. No pancreatic ductal dilatation or surrounding inflammatory changes. Spleen: Small calcification in the splenic hilum could reflect a calcified granuloma or vascular calcification. No worrisome splenic lesion. Normal splenic size. 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 Additional mild nonspecific thickening of the left  adrenal gland without concerning dominant nodule. The kidneys are orthotopic. Bilateral fluid attenuation renal cysts are present, in the right kidney measures up to 3.8 cm, in the left measuring up to 2.4 cm. No concerning renal masses. There is bilateral perinephric stranding which is slightly asymmetrically increased on the left with mild pelviectasis and ureterectasis to the level of the urinary bladder. A punctate radiodensity is seen along the left posterolateral margin of the urinary bladder separate from the UV junction though could reflect a recently passed calculus. Additional nonobstructing calculus versus vascular calcifications seen in the lower pole left kidney as well. Stomach/Bowel: Small hiatal hernia, as above. Distal stomach and duodenum are unremarkable with normal course of the duodenum across the midline abdomen. No small bowel dilatation or wall thickening. A normal appendix is visualized. No colonic dilatation or wall thickening. Scattered colonic diverticula without focal pericolonic inflammation to suggest diverticulitis. No evidence of mechanical bowel obstruction. Vascular/Lymphatic: Atherosclerotic plaque within the normal caliber aorta. No pathologically enlarged abdominopelvic lymph nodes. Reproductive: Anteverted uterus with partially calcified anterior lobular probable fibroid measuring up to 6 cm in size. No concerning adnexal lesions. Other: No abdominopelvic free fluid or free gas. No bowel containing hernias. Small fat containing umbilical hernia. Musculoskeletal: Mild levocurvature of the lumbar spine, apex L4. Multilevel degenerative changes are present in the imaged portions of the spine. Additional degenerative changes at the SI joints, bilateral hips and symphysis pubis. No acute osseous abnormality or suspicious osseous lesion. IMPRESSION: 1. Features of vascular congestion with early/mild interstitial edema. No other acute intrathoracic abnormality. 2. Punctate  radiodensity in the left posterolateral bladder, separate from the left UVJ. Could reflect a recently passed calculus with mild left pelviectasis and ureterectasis with some asymmetric left renal stranding. 3. Additional nonobstructing calculus versus vascular calcifications in the lower pole left kidney as well. 4. Hepatic steatosis. 5. 1.6 cm low-attenuation  nodule in the right adrenal gland most compatible with benign adenoma. 6. Colonic diverticulosis without evidence of diverticulitis. 7. Fibroid uterus. 8. Small sliding-type hiatal hernia. 9. Hypoattenuation of the cardiac blood pool suggests anemia. 10. Aortic Atherosclerosis (ICD10-I70.0). Electronically Signed   By: Lovena Le M.D.   On: 05/06/2019 00:55   DG Chest 2 View  Result Date: 05/05/2019 CLINICAL DATA:  Lethargy and chills. EXAM: CHEST - 2 VIEW COMPARISON:  None. FINDINGS: Very mild, diffusely increased lung markings are seen without evidence of acute infiltrate, pleural effusion or pneumothorax. The heart size and mediastinal contours are within normal limits. Very mild prominence of the right hilum is seen. The visualized skeletal structures are unremarkable. IMPRESSION: 1. Very mild, diffusely increased lung markings which are likely chronic in nature. 2. Mild prominence of the right hilum which may be vascular in origin. Mild lymphadenopathy cannot be excluded. Electronically Signed   By: Virgina Norfolk M.D.   On: 05/05/2019 22:32   CT Chest Wo Contrast  Result Date: 05/06/2019 CLINICAL DATA:  Persistent cough, lethargy and chills, abdominal distension and right flank pain with acute kidney injury and sepsis EXAM: CT CHEST, ABDOMEN AND PELVIS WITHOUT CONTRAST TECHNIQUE: Multidetector CT imaging of the chest, abdomen and pelvis was performed following the standard protocol without IV contrast. COMPARISON:  Chest radiograph 05/05/2019 FINDINGS: CT CHEST FINDINGS Cardiovascular: Luminal evaluation of vasculature precluded in the absence  of contrast media. Hypoattenuation of the cardiac blood pool relative to the myocardium suggests a relative anemia. Normal cardiac size. No pericardial effusion. Calcifications are present on the mitral annulus. Few coronary artery calcifications are present. Suspect calcification of the aortic leaflets as well. Atherosclerotic plaque within the normal caliber aorta. An shared origin of the brachiocephalic and left common carotid artery. Minimal calcification in the proximal great vessels. Central pulmonary arteries are normal caliber. Mediastinum/Nodes: No mediastinal fluid or gas. Diminutive appearance of the right thyroid lobe, could reflect prior thyroid surgery. Thyroid gland and thoracic inlet are otherwise unremarkable. No acute abnormality of the trachea. Small sliding-type hiatal hernia. No worrisome mediastinal or axillary adenopathy. Hilar nodal evaluation is limited in the absence of intravenous contrast media. Lungs/Pleura: Coronal imaging demonstrates mild cephalad redistribution of the pulmonary vascularity with interlobular septal thickening towards the apices and lung bases. No focal consolidation, pneumothorax or effusion. Dependent areas of atelectasis are present with additional bandlike opacities in the right middle lobe and both lung bases compatible with scarring and or atelectasis. No concerning pulmonary nodules or masses. Musculoskeletal: No chest wall mass or suspicious bone lesions identified. Multilevel degenerative changes are present in the imaged portions of the spine. Minimal degenerative changes in bilateral glenohumeral joints. CT ABDOMEN PELVIS FINDINGS Hepatobiliary: Diffuse hepatic hypoattenuation compatible with hepatic steatosis. Focal fatty sparing along the gallbladder fossa. Normal gallbladder. No visible calcified gallstones. No biliary ductal dilatation. Pancreas: Unremarkable. No pancreatic ductal dilatation or surrounding inflammatory changes. Spleen: Small  calcification in the splenic hilum could reflect a calcified granuloma or vascular calcification. No worrisome splenic lesion. Normal splenic size. 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 Additional mild nonspecific thickening of the left adrenal gland without concerning dominant nodule. The kidneys are orthotopic. Bilateral fluid attenuation renal cysts are present, in the right kidney measures up to 3.8 cm, in the left measuring up to 2.4 cm. No concerning renal masses. There is bilateral perinephric stranding which is slightly asymmetrically increased on  the left with mild pelviectasis and ureterectasis to the level of the urinary bladder. A punctate radiodensity is seen along the left posterolateral margin of the urinary bladder separate from the UV junction though could reflect a recently passed calculus. Additional nonobstructing calculus versus vascular calcifications seen in the lower pole left kidney as well. Stomach/Bowel: Small hiatal hernia, as above. Distal stomach and duodenum are unremarkable with normal course of the duodenum across the midline abdomen. No small bowel dilatation or wall thickening. A normal appendix is visualized. No colonic dilatation or wall thickening. Scattered colonic diverticula without focal pericolonic inflammation to suggest diverticulitis. No evidence of mechanical bowel obstruction. Vascular/Lymphatic: Atherosclerotic plaque within the normal caliber aorta. No pathologically enlarged abdominopelvic lymph nodes. Reproductive: Anteverted uterus with partially calcified anterior lobular probable fibroid measuring up to 6 cm in size. No concerning adnexal lesions. Other: No abdominopelvic free fluid or free gas. No bowel containing hernias. Small fat containing umbilical hernia. Musculoskeletal: Mild levocurvature of the lumbar spine, apex L4. Multilevel  degenerative changes are present in the imaged portions of the spine. Additional degenerative changes at the SI joints, bilateral hips and symphysis pubis. No acute osseous abnormality or suspicious osseous lesion. IMPRESSION: 1. Features of vascular congestion with early/mild interstitial edema. No other acute intrathoracic abnormality. 2. Punctate radiodensity in the left posterolateral bladder, separate from the left UVJ. Could reflect a recently passed calculus with mild left pelviectasis and ureterectasis with some asymmetric left renal stranding. 3. Additional nonobstructing calculus versus vascular calcifications in the lower pole left kidney as well. 4. Hepatic steatosis. 5. 1.6 cm low-attenuation nodule in the right adrenal gland most compatible with benign adenoma. 6. Colonic diverticulosis without evidence of diverticulitis. 7. Fibroid uterus. 8. Small sliding-type hiatal hernia. 9. Hypoattenuation of the cardiac blood pool suggests anemia. 10. Aortic Atherosclerosis (ICD10-I70.0). Electronically Signed   By: Lovena Le M.D.   On: 05/06/2019 00:55   DG C-Arm 1-60 Min-No Report  Result Date: 05/06/2019 Fluoroscopy was utilized by the requesting physician.  No radiographic interpretation.        Scheduled Meds: . Chlorhexidine Gluconate Cloth  6 each Topical Daily  . enoxaparin (LOVENOX) injection  40 mg Subcutaneous Q24H  . folic acid  1 mg Oral Daily  . insulin aspart  0-15 Units Subcutaneous Q6H  . oxybutynin  5 mg Oral TID  . tamsulosin  0.4 mg Oral Daily   Continuous Infusions: . sodium chloride 100 mL/hr at 05/07/19 0751  . cefTRIAXone (ROCEPHIN)  IV Stopped (05/06/19 2054)     LOS: 1 day    Time spent: 40 minutes    Irine Seal, MD [facility]   To contact the attending provider between 7A-7P or the covering provider during after hours 7P-7A, please log into the web site [email or web address] and access using universal [place] password for that web site. If  you do not have the password, please call the hospital operator.  05/07/2019, 8:47 AM

## 2019-05-07 NOTE — Progress Notes (Signed)
Urology Progress Note   1 Day Post-Op from cystoscopy, left ureteral stone extraction with basket, left ureteral stent placement.   Subjective: Transferred to stepdown care.  She reports doing much better.  Her affect is much brighter and she is much more clear/alert oriented. Has made excellent urine since the operating room. Creatinine downtrending. Blood cultures preliminary returned with E. coli.  Objective: Vital signs in last 24 hours: Temp:  [97.8 F (36.6 C)-99.8 F (37.7 C)] 98.5 F (36.9 C) (05/03 0800) Pulse Rate:  [95-111] 106 (05/03 0800) Resp:  [16-26] 18 (05/03 0800) BP: (96-124)/(38-64) 116/48 (05/03 0800) SpO2:  [97 %-100 %] 98 % (05/03 0800) Weight:  [98.9 kg] 98.9 kg (05/02 1134)  Intake/Output from previous day: 05/02 0701 - 05/03 0700 In: 2401.3 [P.O.:240; I.V.:1961.4; IV Piggyback:199.9] Out: 3055 [Urine:3055] Intake/Output this shift: No intake/output data recorded.  Physical Exam:  General: Alert and oriented GU: Foley in place draining clear yellow urine with stent string taped to it.   Lab Results: Recent Labs    05/05/19 2134 05/06/19 0742 05/07/19 0343  HGB 10.4* 8.9* 8.6*  HCT 31.9* 28.7* 27.9*   Recent Labs    05/06/19 0742 05/07/19 0343  NA 142 142  K 4.0 4.1  CL 112* 112*  CO2 22 21*  GLUCOSE 167* 117*  BUN 41* 34*  CREATININE 2.67* 1.68*  CALCIUM 8.3* 8.4*    Studies/Results: CT ABDOMEN PELVIS WO CONTRAST  Result Date: 05/06/2019 CLINICAL DATA:  Persistent cough, lethargy and chills, abdominal distension and right flank pain with acute kidney injury and sepsis EXAM: CT CHEST, ABDOMEN AND PELVIS WITHOUT CONTRAST TECHNIQUE: Multidetector CT imaging of the chest, abdomen and pelvis was performed following the standard protocol without IV contrast. COMPARISON:  Chest radiograph 05/05/2019 FINDINGS: CT CHEST FINDINGS Cardiovascular: Luminal evaluation of vasculature precluded in the absence of contrast media. Hypoattenuation of the  cardiac blood pool relative to the myocardium suggests a relative anemia. Normal cardiac size. No pericardial effusion. Calcifications are present on the mitral annulus. Few coronary artery calcifications are present. Suspect calcification of the aortic leaflets as well. Atherosclerotic plaque within the normal caliber aorta. An shared origin of the brachiocephalic and left common carotid artery. Minimal calcification in the proximal great vessels. Central pulmonary arteries are normal caliber. Mediastinum/Nodes: No mediastinal fluid or gas. Diminutive appearance of the right thyroid lobe, could reflect prior thyroid surgery. Thyroid gland and thoracic inlet are otherwise unremarkable. No acute abnormality of the trachea. Small sliding-type hiatal hernia. No worrisome mediastinal or axillary adenopathy. Hilar nodal evaluation is limited in the absence of intravenous contrast media. Lungs/Pleura: Coronal imaging demonstrates mild cephalad redistribution of the pulmonary vascularity with interlobular septal thickening towards the apices and lung bases. No focal consolidation, pneumothorax or effusion. Dependent areas of atelectasis are present with additional bandlike opacities in the right middle lobe and both lung bases compatible with scarring and or atelectasis. No concerning pulmonary nodules or masses. Musculoskeletal: No chest wall mass or suspicious bone lesions identified. Multilevel degenerative changes are present in the imaged portions of the spine. Minimal degenerative changes in bilateral glenohumeral joints. CT ABDOMEN PELVIS FINDINGS Hepatobiliary: Diffuse hepatic hypoattenuation compatible with hepatic steatosis. Focal fatty sparing along the gallbladder fossa. Normal gallbladder. No visible calcified gallstones. No biliary ductal dilatation. Pancreas: Unremarkable. No pancreatic ductal dilatation or surrounding inflammatory changes. Spleen: Small calcification in the splenic hilum could reflect a  calcified granuloma or vascular calcification. No worrisome splenic lesion. Normal splenic size. 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 Additional mild nonspecific thickening of the left adrenal gland without concerning dominant nodule. The kidneys are orthotopic. Bilateral fluid attenuation renal cysts are present, in the right kidney measures up to 3.8 cm, in the left measuring up to 2.4 cm. No concerning renal masses. There is bilateral perinephric stranding which is slightly asymmetrically increased on the left with mild pelviectasis and ureterectasis to the level of the urinary bladder. A punctate radiodensity is seen along the left posterolateral margin of the urinary bladder separate from the UV junction though could reflect a recently passed calculus. Additional nonobstructing calculus versus vascular calcifications seen in the lower pole left kidney as well. Stomach/Bowel: Small hiatal hernia, as above. Distal stomach and duodenum are unremarkable with normal course of the duodenum across the midline abdomen. No small bowel dilatation or wall thickening. A normal appendix is visualized. No colonic dilatation or wall thickening. Scattered colonic diverticula without focal pericolonic inflammation to suggest diverticulitis. No evidence of mechanical bowel obstruction. Vascular/Lymphatic: Atherosclerotic plaque within the normal caliber aorta. No pathologically enlarged abdominopelvic lymph nodes. Reproductive: Anteverted uterus with partially calcified anterior lobular probable fibroid measuring up to 6 cm in size. No concerning adnexal lesions. Other: No abdominopelvic free fluid or free gas. No bowel containing hernias. Small fat containing umbilical hernia. Musculoskeletal: Mild levocurvature of the lumbar spine, apex L4. Multilevel degenerative changes are present in the imaged  portions of the spine. Additional degenerative changes at the SI joints, bilateral hips and symphysis pubis. No acute osseous abnormality or suspicious osseous lesion. IMPRESSION: 1. Features of vascular congestion with early/mild interstitial edema. No other acute intrathoracic abnormality. 2. Punctate radiodensity in the left posterolateral bladder, separate from the left UVJ. Could reflect a recently passed calculus with mild left pelviectasis and ureterectasis with some asymmetric left renal stranding. 3. Additional nonobstructing calculus versus vascular calcifications in the lower pole left kidney as well. 4. Hepatic steatosis. 5. 1.6 cm low-attenuation nodule in the right adrenal gland most compatible with benign adenoma. 6. Colonic diverticulosis without evidence of diverticulitis. 7. Fibroid uterus. 8. Small sliding-type hiatal hernia. 9. Hypoattenuation of the cardiac blood pool suggests anemia. 10. Aortic Atherosclerosis (ICD10-I70.0). Electronically Signed   By: Lovena Le M.D.   On: 05/06/2019 00:55   DG Chest 2 View  Result Date: 05/05/2019 CLINICAL DATA:  Lethargy and chills. EXAM: CHEST - 2 VIEW COMPARISON:  None. FINDINGS: Very mild, diffusely increased lung markings are seen without evidence of acute infiltrate, pleural effusion or pneumothorax. The heart size and mediastinal contours are within normal limits. Very mild prominence of the right hilum is seen. The visualized skeletal structures are unremarkable. IMPRESSION: 1. Very mild, diffusely increased lung markings which are likely chronic in nature. 2. Mild prominence of the right hilum which may be vascular in origin. Mild lymphadenopathy cannot be excluded. Electronically Signed   By: Virgina Norfolk M.D.   On: 05/05/2019 22:32   CT Chest Wo Contrast  Result Date: 05/06/2019 CLINICAL DATA:  Persistent cough, lethargy and chills, abdominal distension and right flank pain with acute kidney injury and sepsis EXAM: CT CHEST, ABDOMEN  AND PELVIS WITHOUT CONTRAST TECHNIQUE: Multidetector CT imaging of the chest, abdomen and pelvis was performed following the standard protocol without IV contrast. COMPARISON:  Chest radiograph 05/05/2019 FINDINGS: CT CHEST FINDINGS Cardiovascular: Luminal evaluation of vasculature precluded in the absence of contrast media. Hypoattenuation of the cardiac blood pool relative to  the myocardium suggests a relative anemia. Normal cardiac size. No pericardial effusion. Calcifications are present on the mitral annulus. Few coronary artery calcifications are present. Suspect calcification of the aortic leaflets as well. Atherosclerotic plaque within the normal caliber aorta. An shared origin of the brachiocephalic and left common carotid artery. Minimal calcification in the proximal great vessels. Central pulmonary arteries are normal caliber. Mediastinum/Nodes: No mediastinal fluid or gas. Diminutive appearance of the right thyroid lobe, could reflect prior thyroid surgery. Thyroid gland and thoracic inlet are otherwise unremarkable. No acute abnormality of the trachea. Small sliding-type hiatal hernia. No worrisome mediastinal or axillary adenopathy. Hilar nodal evaluation is limited in the absence of intravenous contrast media. Lungs/Pleura: Coronal imaging demonstrates mild cephalad redistribution of the pulmonary vascularity with interlobular septal thickening towards the apices and lung bases. No focal consolidation, pneumothorax or effusion. Dependent areas of atelectasis are present with additional bandlike opacities in the right middle lobe and both lung bases compatible with scarring and or atelectasis. No concerning pulmonary nodules or masses. Musculoskeletal: No chest wall mass or suspicious bone lesions identified. Multilevel degenerative changes are present in the imaged portions of the spine. Minimal degenerative changes in bilateral glenohumeral joints. CT ABDOMEN PELVIS FINDINGS Hepatobiliary: Diffuse  hepatic hypoattenuation compatible with hepatic steatosis. Focal fatty sparing along the gallbladder fossa. Normal gallbladder. No visible calcified gallstones. No biliary ductal dilatation. Pancreas: Unremarkable. No pancreatic ductal dilatation or surrounding inflammatory changes. Spleen: Small calcification in the splenic hilum could reflect a calcified granuloma or vascular calcification. No worrisome splenic lesion. Normal splenic size. 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 Additional mild nonspecific thickening of the left adrenal gland without concerning dominant nodule. The kidneys are orthotopic. Bilateral fluid attenuation renal cysts are present, in the right kidney measures up to 3.8 cm, in the left measuring up to 2.4 cm. No concerning renal masses. There is bilateral perinephric stranding which is slightly asymmetrically increased on the left with mild pelviectasis and ureterectasis to the level of the urinary bladder. A punctate radiodensity is seen along the left posterolateral margin of the urinary bladder separate from the UV junction though could reflect a recently passed calculus. Additional nonobstructing calculus versus vascular calcifications seen in the lower pole left kidney as well. Stomach/Bowel: Small hiatal hernia, as above. Distal stomach and duodenum are unremarkable with normal course of the duodenum across the midline abdomen. No small bowel dilatation or wall thickening. A normal appendix is visualized. No colonic dilatation or wall thickening. Scattered colonic diverticula without focal pericolonic inflammation to suggest diverticulitis. No evidence of mechanical bowel obstruction. Vascular/Lymphatic: Atherosclerotic plaque within the normal caliber aorta. No pathologically enlarged abdominopelvic lymph nodes. Reproductive: Anteverted uterus with partially  calcified anterior lobular probable fibroid measuring up to 6 cm in size. No concerning adnexal lesions. Other: No abdominopelvic free fluid or free gas. No bowel containing hernias. Small fat containing umbilical hernia. Musculoskeletal: Mild levocurvature of the lumbar spine, apex L4. Multilevel degenerative changes are present in the imaged portions of the spine. Additional degenerative changes at the SI joints, bilateral hips and symphysis pubis. No acute osseous abnormality or suspicious osseous lesion. IMPRESSION: 1. Features of vascular congestion with early/mild interstitial edema. No other acute intrathoracic abnormality. 2. Punctate radiodensity in the left posterolateral bladder, separate from the left UVJ. Could reflect a recently passed calculus with mild left pelviectasis and ureterectasis with some asymmetric left renal stranding. 3. Additional  nonobstructing calculus versus vascular calcifications in the lower pole left kidney as well. 4. Hepatic steatosis. 5. 1.6 cm low-attenuation nodule in the right adrenal gland most compatible with benign adenoma. 6. Colonic diverticulosis without evidence of diverticulitis. 7. Fibroid uterus. 8. Small sliding-type hiatal hernia. 9. Hypoattenuation of the cardiac blood pool suggests anemia. 10. Aortic Atherosclerosis (ICD10-I70.0). Electronically Signed   By: Lovena Le M.D.   On: 05/06/2019 00:55   DG C-Arm 1-60 Min-No Report  Result Date: 05/06/2019 Fluoroscopy was utilized by the requesting physician.  No radiographic interpretation.    Assessment/Plan:  79 y.o. female with history of sarcoidosis, diabetes, hypertension, who presented with left flank discomfort and fever with a CT scan that demonstrated a 5 mm UVJ obstructing stone with associated left hydroureteronephrosis. Infected obstructing stone s/p left ureteroscopic basket extraction and stent placement on 05/06/19.   Infected obstructing ureteral calculi Overall recovering well.   Clinically appears more alert and oriented.  Excellent urine output via Foley catheter.  Creatinine downtrending. Blood cultures returned with E. coli.  Remains on ceftriaxone   Continue Foley catheter.  Do not remove Foley catheter as it is affixed to the ureteral stent.  If clinically doing well in several days, can discuss trial of void with ureteral stent removal at that time.  Flomax for stent discomfort  Ditropan or belladonna opium suppositories for bladder spasms  Follow-up stone analysis  Follow-up urine culture  Continue broad-spectrum antibiotics and tailor pending urine culture results   LOS: 1 day

## 2019-05-08 LAB — COMPREHENSIVE METABOLIC PANEL
ALT: 41 U/L (ref 0–44)
AST: 30 U/L (ref 15–41)
Albumin: 3 g/dL — ABNORMAL LOW (ref 3.5–5.0)
Alkaline Phosphatase: 65 U/L (ref 38–126)
Anion gap: 8 (ref 5–15)
BUN: 23 mg/dL (ref 8–23)
CO2: 22 mmol/L (ref 22–32)
Calcium: 8.8 mg/dL — ABNORMAL LOW (ref 8.9–10.3)
Chloride: 111 mmol/L (ref 98–111)
Creatinine, Ser: 1.29 mg/dL — ABNORMAL HIGH (ref 0.44–1.00)
GFR calc Af Amer: 47 mL/min — ABNORMAL LOW (ref >60)
GFR calc non Af Amer: 40 mL/min — ABNORMAL LOW (ref >60)
Glucose, Bld: 123 mg/dL — ABNORMAL HIGH (ref 70–99)
Potassium: 4 mmol/L (ref 3.5–5.1)
Sodium: 141 mmol/L (ref 135–145)
Total Bilirubin: 0.5 mg/dL (ref 0.3–1.2)
Total Protein: 6.1 g/dL — ABNORMAL LOW (ref 6.5–8.1)

## 2019-05-08 LAB — CBC WITH DIFFERENTIAL/PLATELET
Abs Immature Granulocytes: 0.18 10*3/uL — ABNORMAL HIGH (ref 0.00–0.07)
Basophils Absolute: 0.1 10*3/uL (ref 0.0–0.1)
Basophils Relative: 1 %
Eosinophils Absolute: 0.4 10*3/uL (ref 0.0–0.5)
Eosinophils Relative: 2 %
HCT: 27.4 % — ABNORMAL LOW (ref 36.0–46.0)
Hemoglobin: 8.7 g/dL — ABNORMAL LOW (ref 12.0–15.0)
Immature Granulocytes: 1 %
Lymphocytes Relative: 11 %
Lymphs Abs: 1.9 10*3/uL (ref 0.7–4.0)
MCH: 28.6 pg (ref 26.0–34.0)
MCHC: 31.8 g/dL (ref 30.0–36.0)
MCV: 90.1 fL (ref 80.0–100.0)
Monocytes Absolute: 1.2 10*3/uL — ABNORMAL HIGH (ref 0.1–1.0)
Monocytes Relative: 7 %
Neutro Abs: 14.6 10*3/uL — ABNORMAL HIGH (ref 1.7–7.7)
Neutrophils Relative %: 78 %
Platelets: 210 10*3/uL (ref 150–400)
RBC: 3.04 MIL/uL — ABNORMAL LOW (ref 3.87–5.11)
RDW: 15.4 % (ref 11.5–15.5)
WBC: 18.5 10*3/uL — ABNORMAL HIGH (ref 4.0–10.5)
nRBC: 0.1 % (ref 0.0–0.2)

## 2019-05-08 LAB — GLUCOSE, CAPILLARY
Glucose-Capillary: 121 mg/dL — ABNORMAL HIGH (ref 70–99)
Glucose-Capillary: 129 mg/dL — ABNORMAL HIGH (ref 70–99)
Glucose-Capillary: 114 mg/dL — ABNORMAL HIGH (ref 70–99)
Glucose-Capillary: 141 mg/dL — ABNORMAL HIGH (ref 70–99)

## 2019-05-08 LAB — CULTURE, BLOOD (ROUTINE X 2): Special Requests: ADEQUATE

## 2019-05-08 LAB — URINE CULTURE: Culture: 40000 — AB

## 2019-05-08 LAB — MAGNESIUM: Magnesium: 1.9 mg/dL (ref 1.7–2.4)

## 2019-05-08 LAB — PROCALCITONIN: Procalcitonin: 128.15 ng/mL

## 2019-05-08 MED ORDER — FUROSEMIDE 10 MG/ML IJ SOLN
20.0000 mg | Freq: Once | INTRAMUSCULAR | Status: AC
  Administered 2019-05-08: 20 mg via INTRAVENOUS
  Filled 2019-05-08: qty 2

## 2019-05-08 NOTE — Progress Notes (Signed)
Occupational Therapy Evaluation Patient Details Name: Diane Wilkerson MRN: [id number] DOB: Oct 21, 1940 Today's Date: 05/08/2019    History of Present Illness Patient is 79 y.o. female with history of sarcoidosis, diabetes, hypertension, who presented with left flank discomfort and fever with a CT scan that demonstrated a 5 mm UVJ obstructing stone with associated left hydroureteronephrosis. Infected obstructing stone s/p left ureteroscopic basket extraction and stent placement on 05/06/19. Cultures returned positive for e. coli.    Clinical Impression   PTA, pt independent with ADL and mobility, lived with her husband and enjoyed reading, watching TV and cooking. Pt presents with generalized weakness and decreased activity tolerance. Pt overall set up for UB ADL and min A for LB ADL. Ambulated @ 100 ft in hallway and became tachy into 140s. Once pt returned to room, HR returned to 110s. Other VSS - nsg made aware. Will follow acutely to facilitate safe DC home. Do not anticipate OT needs after DC.     Follow Up Recommendations  No OT follow up;Supervision - Intermittent    Equipment Recommendations  None recommended by OT    Recommendations for Other Services       Precautions / Restrictions Precautions Precautions: Fall      Mobility Bed Mobility               General bed mobility comments: pt in recliner at start of session.   Transfers Overall transfer level: Needs assistance Equipment used: Rolling walker (2 wheeled) Transfers: Sit to/from Stand Sit to Stand: Min guard         General transfer comment: Pt reaching for external support when walking to bathroom without RW    Balance Overall balance assessment: Needs assistance   Sitting balance-Leahy Scale: Good       Standing balance-Leahy Scale: Fair                             ADL either performed or assessed with clinical judgement   ADL Overall ADL's : Needs assistance/impaired      Grooming: Min guard;Standing(at bathroom sink)   Upper Body Bathing: Set up;Sitting   Lower Body Bathing: Minimal assistance;Sit to/from stand   Upper Body Dressing : Set up;Sitting   Lower Body Dressing: Minimal assistance;Sit to/from stand   Toilet Transfer: Minimal assistance;Ambulation   Toileting- Clothing Manipulation and Hygiene: Min guard       Functional mobility during ADLs: Min guard;Rolling walker  Educated pt/husband on recommendation to use showerseat after DC and have S with bathing. Pt/husband verbalized understanding.        Vision  no changes per pt       Perception     Praxis      Pertinent Vitals/Pain Pain Assessment: No/denies pain     Hand Dominance Right   Extremity/Trunk Assessment Upper Extremity Assessment Upper Extremity Assessment: Generalized weakness   Lower Extremity Assessment Lower Extremity Assessment: Defer to PT evaluation   Cervical / Trunk Assessment Cervical / Trunk Assessment: Normal   Communication Communication Communication: No difficulties   Cognition Arousal/Alertness: Awake/alert Behavior During Therapy: WFL for tasks assessed/performed Overall Cognitive Status: Within Functional Limits for tasks assessed                                     General Comments  Pt enjoys reading; watching TV and cooking    Exercises  Exercises: General Upper Extremity General Exercises - Upper Extremity Shoulder Flexion: Strengthening;Both;10 reps;Theraband Theraband Level (Shoulder Flexion): Level 1 (Yellow) Shoulder ABduction: Strengthening;Both;10 reps;Seated;Theraband Theraband Level (Shoulder Abduction): Level 1 (Yellow) Elbow Flexion: Strengthening;Both;10 reps;Seated;Theraband Theraband Level (Elbow Flexion): Level 1 (Yellow) Elbow Extension: Strengthening;Both;10 reps;Seated;Theraband Theraband Level (Elbow Extension): Level 1 (Yellow)   Shoulder Instructions      Home Living Family/patient  expects to be discharged to:: Private residence Living Arrangements: Spouse/significant other Available Help at Discharge: Family Type of Home: House Home Access: Stairs to enter [employer] of Steps: 14 Entrance Stairs-Rails: Can reach both Home Layout: One level;Laundry or work area in [employer] Shower/Tub: Tub/shower unit;Walk-in shower   Bathroom Toilet: Handicapped height Bathroom Accessibility: Yes How Accessible: Accessible via walker Home Equipment: Shower seat - built in;Grab bars - tub/shower;Cane - single point          Prior Functioning/Environment Level of Independence: Independent                 OT Problem List: Decreased strength;Decreased activity tolerance;Impaired balance (sitting and/or standing);Decreased knowledge of use of DME or AE;Cardiopulmonary status limiting activity;Obesity      OT Treatment/Interventions: Self-care/ADL training;Therapeutic exercise;Energy conservation;DME and/or AE instruction;Therapeutic activities;Patient/family education;Balance training    OT Goals(Current goals can be found in the care plan section) Acute Rehab OT Goals Patient Stated Goal: to get back home and independent OT Goal Formulation: With patient Time For Goal Achievement: 05/22/19 Potential to Achieve Goals: Good  OT Frequency: Min 2X/week   Barriers to D/C:            Co-evaluation              AM-PAC OT "6 Clicks" Daily Activity     Outcome Measure Help from another person eating meals?: None Help from another person taking care of personal grooming?: A Little Help from another person toileting, which includes using toliet, bedpan, or urinal?: A Little Help from another person bathing (including washing, rinsing, drying)?: A Little Help from another person to put on and taking off regular upper body clothing?: A Little Help from another person to put on and taking off regular lower body clothing?: A Little 6 Click  Score: 19   End of Session Equipment Utilized During Treatment: Gait belt;Rolling walker Nurse Communication: Mobility status;Other (comment)(HR)  Activity Tolerance: Patient tolerated treatment well Patient left: in chair;with call bell/phone within reach  OT Visit Diagnosis: Unsteadiness on feet (R26.81);Muscle weakness (generalized) (M62.81)                Time: [id number] OT Time Calculation (min): 34 min Charges:  OT General Charges $OT Visit: 1 Visit OT Evaluation $OT Eval Moderate Complexity: 1 Mod OT Treatments $Self Care/Home Management : 8-22 mins  Maurie Boettcher, OT/L   Acute OT Clinical Specialist Acute Rehabilitation Services Pager [phone] Office [phone]   [facility] 05/08/2019, 3:10 PM

## 2019-05-08 NOTE — Progress Notes (Signed)
Urology Progress Note   2 Days Post-Op from cystoscopy, left ureteral stone extraction with basket, left ureteral stent placement.   Subjective: Continue to stepdown care.  No acute events overnight.  She reports feeling much better.  She is now sitting up in chair.  She is clear alert and oriented.  Intermittent episodes of tachycardia.  Excellent urine output in the past 24 hours 2.1 L.  Creatinine continues to downtrend 1.3 this morning.  Leukocytosis continues to downtrend 18.5 this morning.  Blood cultures growing E. coli.  Foley catheter remains in place with ureteral stent strings attached w tape.  Objective: Vital signs in last 24 hours: Temp:  [97.9 F (36.6 C)-99.3 F (37.4 C)] 98 F (36.7 C) (05/04 1100) Pulse Rate:  [85-109] 95 (05/04 1400) Resp:  [16-25] 17 (05/04 1400) BP: (107-153)/(48-76) 131/48 (05/04 1400) SpO2:  [96 %-99 %] 97 % (05/04 1400)  Intake/Output from previous day: 05/03 0701 - 05/04 0700 In: 2503.8 [P.O.:720; I.V.:1699; IV Piggyback:84.8] Out: 2175 [Urine:2175] Intake/Output this shift: Total I/O In: 277.9 [I.V.:277.9] Out: 2400 [Urine:2400]  Physical Exam:  General: Alert and oriented GU: Foley in place draining clear yellow urine with stent string taped to it.   Lab Results: Recent Labs    05/06/19 0742 05/07/19 0343 05/08/19 0817  HGB 8.9* 8.6* 8.7*  HCT 28.7* 27.9* 27.4*   Recent Labs    05/07/19 0343 05/08/19 0817  NA 142 141  K 4.1 4.0  CL 112* 111  CO2 21* 22  GLUCOSE 117* 123*  BUN 34* 23  CREATININE 1.68* 1.29*  CALCIUM 8.4* 8.8*    Studies/Results: No results found.  Assessment/Plan:  79 y.o. female with history of sarcoidosis, diabetes, hypertension, who presented with left flank discomfort and fever with a CT scan that demonstrated a 5 mm UVJ obstructing stone with associated left hydroureteronephrosis. Infected obstructing stone s/p left ureteroscopic basket extraction and stent placement on 05/06/19.   Infected  obstructing ureteral calculi causing bacteremia Overall recovering well.  Clinically appears more alert and oriented.  Excellent urine output via Foley catheter.  Both blood and urine cultures growing E. coli.  Creatinine downtrending. Remains on ceftriaxone   Continue Foley catheter.  Do not remove Foley catheter as it is affixed to the ureteral stent.  If clinically doing well tomorrow, can discuss trial of void with ureteral stent removal at that time.  Flomax for stent discomfort  Ditropan or belladonna opium suppositories for bladder spasms  Follow-up stone analysis  Agree with culture directed antibiotic therapy, transition to oral per hospitalist team.   LOS: 2 days

## 2019-05-08 NOTE — Progress Notes (Signed)
PROGRESS NOTE    Diane Wilkerson  [id number] DOB: 20-May-1940 DOA: 05/05/2019 PCP: Willey Blade, MD    Chief Complaint  Patient presents with  . Fever    Brief Narrative:  Patient is a pleasant 79 year old female history of diabetes type 2, pulmonary sarcoidosis, hypertension presented to the ED with left flank pain and fever.  Patient noted to have a temperature at home as high as 101.  Patient seen in the ED CT abdomen and pelvis showing evidence of left-sided pyelonephritis with concerns for a passed calculus near UVJ in the left bladder.  Patient noted to be septic with a severe lactic acidosis, acute renal failure suggestive of organ dysfunction, left-sided pyelonephritis secondary to retained left ureteral stone.  Urology was consulted and patient taken emergently to the OR and underwent cystoscopy stone extraction, left ureteral stent placement.  Patient noted to have left hydronephrosis as well.  Patient started empirically on broad-spectrum IV antibiotics and awaiting placement in the stepdown unit.   Assessment & Plan:   Principal Problem:   Severe sepsis with acute organ dysfunction due to gram-negative bacteria (HCC) Active Problems:   Acute pyelonephritis   E coli bacteremia   Complicated UTI (urinary tract infection)   Essential hypertension   Uncontrolled type 2 diabetes mellitus with hyperglycemia, without long-term current use of insulin (HCC)   AKI (acute kidney injury) (HCC)   Lactic acidosis   Hypokalemia, inadequate intake   Adrenal adenoma, right   Left ureteral stone   Hydronephrosis, left   Folate deficiency   Iron deficiency anemia  1 severe sepsis with acute organ dysfunction secondary to acute left pyelonephritis, complicated UTI with left distal ureteral stone and left hydronephrosis. Patient admitted meeting criteria for sepsis with acute organ dysfunction.  Patient on admission noted to have a severe lactic acidosis, acute renal failure,  left-sided pyelonephritis likely secondary to retained left ureteral stone, fever of 103.1, noted to be tachycardic with heart rate of 137, tachypneic with respiratory rate of 28 and initial presentation with a blood pressure of 96/58.  CT abdomen and pelvis done suggestive that stone may have passed into the left bladder.  Admitting physician discussed with urology and was felt there was not convincing CT evidence that stone may have passed and patient was taken emergently to the OR.  Patient underwent cystoscopy, left basket extraction of ureteral stone, left retrograde pyelogram with interpretation, left ureteral stent placement and tolerated procedure well.  Patient pancultured.  Blood culture positive for E. coli bacteremia.  Urine cultures with 40,000 colonies of E. coli.  Lactic acidosis trending down.  Procalcitonin elevated at > 150 on admission however trending down and currently at 128.15.Marland Kitchen Patient received a dose of IV Rocephin and IV azithromycin in the ED.  Patient received a dose of IV vancomycin and  IV Zosyn (05/06/2019).  IV antibiotics have been narrowed down to IV Rocephin.  Per urology leave Foley catheter in place.  If continued improvement could likely transition to oral antibiotics in the next 1 to 2 days.  Urology following and appreciate input and recommendations.  2.  E. coli bacteremia Likely seeded from complicated UTI/left pyelonephritis.  IV antibiotics were narrowed down from IV Zosyn and patient currently on IV Rocephin.  Will likely need a 2-week course of antibiotic treatment.  Follow.  3.  Acute renal failure Likely secondary to a post renal azotemia secondary to hydronephrosis in the setting of ureteral stone status post stone extraction and stent placement in the setting  of sepsis secondary to pyelonephritis/complicated UTI and on chronic diuretic and ARB.Marland Kitchen  Per urology on review of records from care everywhere notes from January 2021 reporting a creatinine of 1.8, 1.0 in  September 2020, 0.9 in June 2020.  Creatinine on admission was 2.64.  Patient with renal function improving with creatinine currently at 1.29 from 1.68 from 2.67 from 2.64.  Patient also noted to have a borderline blood pressure with systolics in the 0000000 on admission and noted to be septic.  Urinalysis obtained on admission cloudy, moderate leukocytes, nitrite negative, 21-50 WBCs, many bacteria.  Urine cultures with 40,000 colonies of E. Coli.saline lock IV fluids.  Status post Foley catheter placement in the OR, patient with a urine output of 2.175 L over the past 24 hours.  Patient looks volume overloaded on examination as such we will give Lasix 20 mg IV x1.  Monitor urine output.  Continue to hold ARB.  Could likely resume home regimen diuretics in the next 1 to 2 days.  Will defer discontinuation of Foley catheter to urology.  Follow.    4.  Hypokalemia/hypomagnesemia Potassium at 4.0 this morning.  Magnesium at 1.9.   5.  Transaminitis Likely secondary to acute infection and dehydration.  LFTs trending down.  Follow.  6.  Iron deficiency anemia/folate deficiency Likely dilutional.  Repeat hemoglobin this morning at 8.7 from 8.6 from 8.9 from 10.4 on admission.  Anemia panel obtained with a iron level of 7, ferritin 183, folate of 5.1.  Continue folic acid 1 mg p.o. daily.  Follow H&H.  May need IV iron during this hospitalization however will defer for now.  Transfusion threshold hemoglobin < 7.  Will need oral iron supplementation on discharge.  7.  Right adrenal adenoma Incidental finding on CT imaging that appears benign.  Outpatient follow-up.  8.  Hypertension Continue to hold antihypertensive medications at this time.  Saline lock IV fluids.  Lasix 20 mg IV x1.  Follow.  9.  Lactic acidosis Patient with a severe lactic acidosis likely secondary to sepsis and volume depletion.  Lactic acid levels slowly trending down.  Saline lock IV fluids.  Continue empiric IV antibiotics.  Follow.      10.  Type 2 diabetes mellitus Hemoglobin A1c 7.1 (05/05/2019).  CBG of 121.  Carb modified diet.  Sliding scale insulin.  Continue to hold oral hypoglycemic agents.    11.  History of pulmonary sarcoidosis Currently stable.  Outpatient follow-up.  12.  Sinus tachycardia Likely secondary to problem #1, E. coli bacteremia.  Continue empiric IV antibiotics.  Saline lock IV fluids.  Follow.  13.  Volume overload Patient with edematous hands.  Some crackles noted on respiratory examination.  Likely secondary to aggressive hydration as patient was admitted with severe sepsis.  Patient is positive for 4.7 L.  Saline lock IV fluids.  Lasix 20 mg IV x1.  Strict I's and O's.  Daily weights.    DVT prophylaxis: SCDs Code Status: Full Family Communication: Updated patient and daughter at bedside. Disposition:   Status is: Inpatient    Dispo: The patient is from: Home              Anticipated d/c is to: Likely home              Anticipated d/c date is: To be determined.              Patient currently in stepdown unit on IV antibiotics for bacteremia, complicated UTI, acute pyelonephritis.  If continued improvement could likely transfer to floor either later on today or tomorrow.          Consultants:   Urology: Dr. Eskridge/Dr. Fredderick Phenix 05/06/2019    Procedures:   CT abdomen and pelvis 05/06/2019  CT chest 05/06/2019  Chest x-ray 05/05/2019  Cystoscopy/left basket extraction of ureteral stone/left retrograde pyelogram with interpretation/left urethral stent placement/simple Foley catheter placement per Dr. Ella Bodo 05/06/2019  Antimicrobials:   IV vancomycin x1 dose pending 05/06/2019  IV Zosyn 05/06/2019>>>>> 05/06/2019  IV azithromycin x1 dose 05/05/2019  IV Rocephin x1 dose 05/05/2019  IV Rocephin 05/06/2019   Subjective: Patient sitting up in bed.  Denies any significant chest pain or shortness of breath.  Complaining of swelling in hands.  Tolerating diet.  No nausea or  vomiting.  No flank pain.  No abdominal pain.  No bleeding noted.    Objective: Vitals:   05/08/19 0620 05/08/19 0700 05/08/19 0800 05/08/19 0900  BP: 118/61  (!) 121/54 134/64  Pulse: 86  90 96  Resp: 18  (!) 21 (!) 22  Temp:  98.8 F (37.1 C)    TempSrc:  Oral    SpO2: 99%  98% 99%  Weight:      Height:        Intake/Output Summary (Last 24 hours) at 05/08/2019 0958 Last data filed at 05/08/2019 [id number] Gross per 24 hour  Intake 2755.26 ml  Output 2175 ml  Net 580.26 ml   Filed Weights   05/05/19 2134 05/06/19 1134  Weight: 86.2 kg 98.9 kg    Examination:  General exam: NAD Respiratory system: Decreased breath sounds in the bases.  Tight.  Some diffuse crackles.  No rhonchi.  No wheezing.  Speaking in full sentences.  Cardiovascular system: Tachycardia.  No JVD, no murmurs rubs or gallops.  No lower extremity edema.  Gastrointestinal system: Abdomen is nontender, nondistended, soft, positive bowel sounds.  No rebound.  No guarding.  Central nervous system: Alert and oriented. No focal neurological deficits. Extremities: Bilateral hands swelling.  Symmetric 5 x 5 power. Skin: No rashes, lesions or ulcers Psychiatry: Judgement and insight appear fair. Mood & affect appropriate.     Data Reviewed: I have personally reviewed following labs and imaging studies  CBC: Recent Labs  Lab 05/05/19 2134 05/06/19 0742 05/07/19 0343 05/08/19 0817  WBC 3.9* 16.9* 20.9* 18.5*  NEUTROABS 3.5 15.1* 16.4* 14.6*  HGB 10.4* 8.9* 8.6* 8.7*  HCT 31.9* 28.7* 27.9* 27.4*  MCV 88.4 90.8 90.3 90.1  PLT 295 239 209 [id number]    Basic Metabolic Panel: Recent Labs  Lab 05/05/19 2134 05/06/19 0742 05/07/19 0343 05/08/19 0817  NA 136 142 142 141  K 3.4* 4.0 4.1 4.0  CL 103 112* 112* 111  CO2 20* 22 21* 22  GLUCOSE 253* 167* 117* 123*  BUN 40* 41* 34* 23  CREATININE 2.64* 2.67* 1.68* 1.29*  CALCIUM 9.4 8.3* 8.4* 8.8*  MG  --  1.4* 2.3 1.9  PHOS  --   --  2.6  --     GFR: Estimated  Creatinine Clearance: 40.8 mL/min (A) (by C-G formula based on SCr of 1.29 mg/dL (H)).  Liver Function Tests: Recent Labs  Lab 05/05/19 2134 05/06/19 0742 05/07/19 0343 05/08/19 0817  AST 117* 95* 46* 30  ALT 60* 60* 49* 41  ALKPHOS 102 53 55 65  BILITOT 0.7 0.6 0.7 0.5  PROT 6.5 5.8* 5.8* 6.1*  ALBUMIN 3.8 3.2* 2.9* 3.0*    CBG: Recent Labs  Lab 05/07/19 0537 05/07/19 1204 05/07/19 1712 05/07/19 2329 05/08/19 0519  GLUCAP 114* 124* 150* 119* 121*     Recent Results (from the past 240 hour(s))  Culture, blood (Routine x 2)     Status: Abnormal   Collection Time: 05/05/19  9:34 PM   Specimen: BLOOD  Result Value Ref Range Status   Specimen Description   Final    BLOOD RIGHT WRIST Performed at [facility], [place] [address], [place], [place] [address]    Special Requests   Final    BOTTLES DRAWN AEROBIC AND ANAEROBIC Blood Culture results may not be optimal due to an excessive volume of blood received in culture bottles Performed at [place] [address], [place], [place] [address]    Culture  Setup Time   Final    GRAM NEGATIVE RODS IN BOTH AEROBIC AND ANAEROBIC BOTTLES CRITICAL RESULT CALLED TO, READ BACK BY AND VERIFIED WITH: Otila Back [id number] AT 39 B Y CM Performed at [facility], [place] [address], [place], [place] [address]    Culture ESCHERICHIA COLI (A)  Final   Report Status 05/08/2019 FINAL  Final   Organism ID, Bacteria ESCHERICHIA COLI  Final      Susceptibility   Escherichia coli - MIC*    AMPICILLIN >=32 RESISTANT Resistant     CEFAZOLIN <=4 SENSITIVE Sensitive     CEFEPIME <=1 SENSITIVE Sensitive     CEFTAZIDIME <=1 SENSITIVE Sensitive     CEFTRIAXONE <=1 SENSITIVE Sensitive     CIPROFLOXACIN <=0.25 SENSITIVE Sensitive     GENTAMICIN <=1 SENSITIVE Sensitive     IMIPENEM <=0.25 SENSITIVE Sensitive     TRIMETH/SULFA <=20 SENSITIVE Sensitive     AMPICILLIN/SULBACTAM 16 INTERMEDIATE  Intermediate     PIP/TAZO <=4 SENSITIVE Sensitive     * ESCHERICHIA COLI  Blood Culture ID Panel (Reflexed)     Status: Abnormal   Collection Time: 05/05/19  9:34 PM  Result Value Ref Range Status   Enterococcus species NOT DETECTED NOT DETECTED Final   Listeria monocytogenes NOT DETECTED NOT DETECTED Final   Staphylococcus species NOT DETECTED NOT DETECTED Final   Staphylococcus aureus (BCID) NOT DETECTED NOT DETECTED Final   Streptococcus species NOT DETECTED NOT DETECTED Final   Streptococcus agalactiae NOT DETECTED NOT DETECTED Final   Streptococcus pneumoniae NOT DETECTED NOT DETECTED Final   Streptococcus pyogenes NOT DETECTED NOT DETECTED Final   Acinetobacter baumannii NOT DETECTED NOT DETECTED Final   Enterobacteriaceae species DETECTED (A) NOT DETECTED Final    Comment: Enterobacteriaceae represent a large family of gram-negative bacteria, not a single organism. CRITICAL RESULT CALLED TO, READ BACK BY AND VERIFIED WITH: PHARMD J LEGGE PD:[id number] AT 1545 BY CM    Enterobacter cloacae complex NOT DETECTED NOT DETECTED Final   Escherichia coli DETECTED (A) NOT DETECTED Final    Comment: CRITICAL RESULT CALLED TO, READ BACK BY AND VERIFIED WITH: PHARMD J LEGGE PD:[id number] AT 1545 BY CM    Klebsiella oxytoca NOT DETECTED NOT DETECTED Final   Klebsiella pneumoniae NOT DETECTED NOT DETECTED Final   Proteus species NOT DETECTED NOT DETECTED Final   Serratia marcescens NOT DETECTED NOT DETECTED Final   Carbapenem resistance NOT DETECTED NOT DETECTED Final   Haemophilus influenzae NOT DETECTED NOT DETECTED Final   Neisseria meningitidis NOT DETECTED NOT DETECTED Final   Pseudomonas aeruginosa NOT DETECTED NOT DETECTED Final   Candida albicans NOT DETECTED NOT DETECTED Final   Candida glabrata NOT DETECTED  NOT DETECTED Final   Candida krusei NOT DETECTED NOT DETECTED Final   Candida parapsilosis NOT DETECTED NOT DETECTED Final   Candida tropicalis NOT DETECTED NOT DETECTED Final     Comment: Performed at [facility], [address], [place], [place] [address]  Culture, blood (Routine x 2)     Status: Abnormal   Collection Time: 05/05/19  9:39 PM   Specimen: BLOOD  Result Value Ref Range Status   Specimen Description   Final    BLOOD RIGHT HAND Performed at [facility] [address] [place], [place] [address]    Special Requests   Final    BOTTLES DRAWN AEROBIC AND ANAEROBIC Blood Culture adequate volume Performed at [facility] [address] [place], [place] [address]    Culture  Setup Time   Final    GRAM NEGATIVE RODS AEROBIC BOTTLE ONLY CRITICAL VALUE NOTED.  VALUE IS CONSISTENT WITH PREVIOUSLY REPORTED AND CALLED VALUE.    Culture (A)  Final    ESCHERICHIA COLI SUSCEPTIBILITIES PERFORMED ON PREVIOUS CULTURE WITHIN THE LAST 5 DAYS. Performed at [facility], [place] [address], [place], [place] [address]    Report Status 05/08/2019 FINAL  Final  Urine culture     Status: Abnormal   Collection Time: 05/05/19  9:40 PM   Specimen: In/Out Cath Urine  Result Value Ref Range Status   Specimen Description   Final    IN/OUT CATH URINE Performed at [facility] [address] [place],  [address]    Special Requests   Final    NONE Performed at [facility], [place] [address], [place], [place] [address]    Culture 40,000 COLONIES/mL ESCHERICHIA COLI (A)  Final   Report Status 05/08/2019 FINAL  Final   Organism ID, Bacteria ESCHERICHIA COLI (A)  Final      Susceptibility   Escherichia coli - MIC*    AMPICILLIN >=32 RESISTANT Resistant     CEFAZOLIN <=4 SENSITIVE Sensitive     CEFTRIAXONE <=1 SENSITIVE Sensitive     CIPROFLOXACIN <=0.25 SENSITIVE Sensitive     GENTAMICIN <=1 SENSITIVE Sensitive     IMIPENEM <=0.25 SENSITIVE Sensitive     NITROFURANTOIN <=16 SENSITIVE Sensitive     TRIMETH/SULFA <=20 SENSITIVE Sensitive     AMPICILLIN/SULBACTAM 16  INTERMEDIATE Intermediate     PIP/TAZO <=4 SENSITIVE Sensitive     * 40,000 COLONIES/mL ESCHERICHIA COLI  Respiratory Panel by RT PCR (Flu A&B, Covid) - Nasopharyngeal Swab     Status: None   Collection Time: 05/05/19  9:41 PM   Specimen: Nasopharyngeal Swab  Result Value Ref Range Status   SARS Coronavirus 2 by RT PCR NEGATIVE NEGATIVE Final    Comment: (NOTE) SARS-CoV-2 target nucleic acids are NOT DETECTED. The SARS-CoV-2 RNA is generally detectable in upper respiratoy specimens during the acute phase of infection. The lowest concentration of SARS-CoV-2 viral copies this assay can detect is 131 copies/mL. A negative result does not preclude SARS-Cov-2 infection and should not be used as the sole basis for treatment or other patient management decisions. A negative result may occur with  improper specimen collection/handling, submission of specimen other than nasopharyngeal swab, presence of viral mutation(s) within the areas targeted by this assay, and inadequate number of viral copies (<131 copies/mL). A negative result must be combined with clinical observations, patient history, and epidemiological information. The expected result is Negative. Fact Sheet for Patients:  PinkCheek.be Fact Sheet  for Healthcare Providers:  GravelBags.it This test is not yet ap proved or cleared by the [place] and  has been authorized for detection and/or diagnosis of SARS-CoV-2 by FDA under an Emergency Use Authorization (EUA). This EUA will remain  in effect (meaning this test can be used) for the duration of the COVID-19 declaration under Section 564(b)(1) of the Act, 21 U.S.C. section 360bbb-3(b)(1), unless the authorization is terminated or revoked sooner.    Influenza A by PCR NEGATIVE NEGATIVE Final   Influenza B by PCR NEGATIVE NEGATIVE Final    Comment: (NOTE) The Xpert Xpress SARS-CoV-2/FLU/RSV assay is intended as an  aid in  the diagnosis of influenza from Nasopharyngeal swab specimens and  should not be used as a sole basis for treatment. Nasal washings and  aspirates are unacceptable for Xpert Xpress SARS-CoV-2/FLU/RSV  testing. Fact Sheet for Patients: PinkCheek.be Fact Sheet for Healthcare Providers: GravelBags.it This test is not yet approved or cleared by the [place] FDA and  has been authorized for detection and/or diagnosis of SARS-CoV-2 by  FDA under an Emergency Use Authorization (EUA). This EUA will remain  in effect (meaning this test can be used) for the duration of the  Covid-19 declaration under Section 564(b)(1) of the Act, 21  U.S.C. section 360bbb-3(b)(1), unless the authorization is  terminated or revoked. Performed at [facility], [place] [address] [place], [place] [address]   Urine Culture     Status: None   Collection Time: 05/06/19  4:06 AM   Specimen: Urine, Cystoscope  Result Value Ref Range Status   Specimen Description   Final    URINE, RANDOM Performed at [facility] [address] [place], [place] [address]    Special Requests   Final    NONE Performed at [facility], [place] [address] [place], [place] [address]    Culture   Final    NO GROWTH Performed at [facility], [place] [address] [place], [place] [address]    Report Status 05/07/2019 FINAL  Final  MRSA PCR Screening     Status: None   Collection Time: 05/06/19 11:36 AM   Specimen: Nasal Mucosa; Nasopharyngeal  Result Value Ref Range Status   MRSA by PCR NEGATIVE NEGATIVE Final    Comment:        The GeneXpert MRSA Assay (FDA approved for NASAL specimens only), is one component of a comprehensive MRSA colonization surveillance program. It is not intended to diagnose MRSA infection nor to guide or monitor treatment for MRSA infections. Performed at [facility], [place] [address] [place] [address], [place], [place] [address]          Radiology Studies: No results found.      Scheduled Meds: . Chlorhexidine Gluconate Cloth  6 each Topical Daily  . enoxaparin (LOVENOX) injection  40 mg Subcutaneous Q24H  . folic acid  1 mg Oral Daily  . furosemide  20 mg Intravenous Once  . insulin aspart  0-15 Units Subcutaneous Q6H  . naphazoline-glycerin  2 drop Both Eyes TID  . oxybutynin  5 mg Oral TID  . tamsulosin  0.4 mg Oral Daily   Continuous Infusions: . cefTRIAXone (ROCEPHIN)  IV Stopped (05/07/19 1735)     LOS: 2 days    Time spent: 40 minutes    Irine Seal, MD [facility]   To contact the attending provider between 7A-7P or the covering provider during after hours 7P-7A, please log into  the web site [email or web address] and access using universal [place] password for that web site. If you do not have the password, please call the hospital operator.  05/08/2019, 9:58 AM

## 2019-05-09 LAB — PROCALCITONIN: Procalcitonin: 55.47 ng/mL

## 2019-05-09 LAB — CBC WITH DIFFERENTIAL/PLATELET
Abs Immature Granulocytes: 0.21 10*3/uL — ABNORMAL HIGH (ref 0.00–0.07)
Basophils Absolute: 0.1 10*3/uL (ref 0.0–0.1)
Basophils Relative: 1 %
Eosinophils Absolute: 0.5 10*3/uL (ref 0.0–0.5)
Eosinophils Relative: 4 %
HCT: 28.4 % — ABNORMAL LOW (ref 36.0–46.0)
Hemoglobin: 9.1 g/dL — ABNORMAL LOW (ref 12.0–15.0)
Immature Granulocytes: 2 %
Lymphocytes Relative: 19 %
Lymphs Abs: 2.2 10*3/uL (ref 0.7–4.0)
MCH: 28.3 pg (ref 26.0–34.0)
MCHC: 32 g/dL (ref 30.0–36.0)
MCV: 88.2 fL (ref 80.0–100.0)
Monocytes Absolute: 1.1 10*3/uL — ABNORMAL HIGH (ref 0.1–1.0)
Monocytes Relative: 9 %
Neutro Abs: 7.3 10*3/uL (ref 1.7–7.7)
Neutrophils Relative %: 65 %
Platelets: 253 10*3/uL (ref 150–400)
RBC: 3.22 MIL/uL — ABNORMAL LOW (ref 3.87–5.11)
RDW: 14.9 % (ref 11.5–15.5)
WBC: 11.3 10*3/uL — ABNORMAL HIGH (ref 4.0–10.5)
nRBC: 0 % (ref 0.0–0.2)

## 2019-05-09 LAB — COMPREHENSIVE METABOLIC PANEL
ALT: 42 U/L (ref 0–44)
AST: 35 U/L (ref 15–41)
Albumin: 3 g/dL — ABNORMAL LOW (ref 3.5–5.0)
Alkaline Phosphatase: 80 U/L (ref 38–126)
Anion gap: 10 (ref 5–15)
BUN: 20 mg/dL (ref 8–23)
CO2: 25 mmol/L (ref 22–32)
Calcium: 9.1 mg/dL (ref 8.9–10.3)
Chloride: 108 mmol/L (ref 98–111)
Creatinine, Ser: 1.07 mg/dL — ABNORMAL HIGH (ref 0.44–1.00)
GFR calc Af Amer: 58 mL/min — ABNORMAL LOW (ref >60)
GFR calc non Af Amer: 50 mL/min — ABNORMAL LOW (ref >60)
Glucose, Bld: 123 mg/dL — ABNORMAL HIGH (ref 70–99)
Potassium: 3.9 mmol/L (ref 3.5–5.1)
Sodium: 143 mmol/L (ref 135–145)
Total Bilirubin: 0.8 mg/dL (ref 0.3–1.2)
Total Protein: 5.9 g/dL — ABNORMAL LOW (ref 6.5–8.1)

## 2019-05-09 LAB — GLUCOSE, CAPILLARY
Glucose-Capillary: 130 mg/dL — ABNORMAL HIGH (ref 70–99)
Glucose-Capillary: 142 mg/dL — ABNORMAL HIGH (ref 70–99)
Glucose-Capillary: 110 mg/dL — ABNORMAL HIGH (ref 70–99)
Glucose-Capillary: 111 mg/dL — ABNORMAL HIGH (ref 70–99)
Glucose-Capillary: 112 mg/dL — ABNORMAL HIGH (ref 70–99)

## 2019-05-09 LAB — LACTIC ACID, PLASMA: Lactic Acid, Venous: 1 mmol/L (ref 0.5–1.9)

## 2019-05-09 LAB — MAGNESIUM: Magnesium: 1.8 mg/dL (ref 1.7–2.4)

## 2019-05-09 MED ORDER — CEFAZOLIN SODIUM-DEXTROSE 2-4 GM/100ML-% IV SOLN
2.0000 g | Freq: Three times a day (TID) | INTRAVENOUS | Status: DC
  Administered 2019-05-09 – 2019-05-10 (×3): 2 g via INTRAVENOUS
  Filled 2019-05-09 (×4): qty 100

## 2019-05-09 MED ORDER — LIP MEDEX EX OINT
TOPICAL_OINTMENT | CUTANEOUS | Status: DC | PRN
  Filled 2019-05-09: qty 7

## 2019-05-09 NOTE — Progress Notes (Signed)
Physical Therapy Treatment Patient Details Name: Diane Wilkerson MRN: [id number] DOB: Nov 16, 1940 Today's Date: 05/09/2019    History of Present Illness Patient is 79 y.o. female with history of sarcoidosis, diabetes, hypertension, who presented with left flank discomfort and fever with a CT scan that demonstrated a 5 mm UVJ obstructing stone with associated left hydroureteronephrosis. Infected obstructing stone s/p left ureteroscopic basket extraction and stent placement on 05/06/19. Cultures returned positive for e. coli.     PT Comments    Pt reported feeling tired on today. She did agree to take a walk with therapy. Min guard assist for mobility. Discussed DME needs-pt would like to have a RW for safe ambulation. Recommend HHPT f/u if pt/family are agreeable.     Follow Up Recommendations  Home health PT;Supervision for mobility/OOB     Equipment Recommendations  Rolling walker with 5" wheels    Recommendations for Other Services       Precautions / Restrictions Precautions Precautions: Fall Restrictions Weight Bearing Restrictions: No    Mobility  Bed Mobility Overal bed mobility: Needs Assistance Bed Mobility: Supine to Sit;Sit to Supine     Supine to sit: Supervision;HOB elevated Sit to supine: Supervision;HOB elevated   General bed mobility comments: Increased time.  Transfers Overall transfer level: Needs assistance Equipment used: Rolling walker (2 wheeled) Transfers: Sit to/from Stand Sit to Stand: Min guard         General transfer comment: cues for hand placement.  Ambulation/Gait Ambulation/Gait assistance: Min guard Gait Distance (Feet): 135 Feet Assistive device: Rolling walker (2 wheeled) Gait Pattern/deviations: Step-through pattern;Decreased stride length     General Gait Details: slow gait speed. no lob with RW use. pt denied lightheadedness/dizziness. tolerated distance well.   Stairs             Wheelchair Mobility    Modified  Rankin (Stroke Patients Only)       Balance Overall balance assessment: Needs assistance           Standing balance-Leahy Scale: Fair                              Cognition Arousal/Alertness: Awake/alert Behavior During Therapy: WFL for tasks assessed/performed Overall Cognitive Status: Within Functional Limits for tasks assessed                                        Exercises      General Comments        Pertinent Vitals/Pain Pain Assessment: No/denies pain    Home Living                      Prior Function            PT Goals (current goals can now be found in the care plan section) Progress towards PT goals: Progressing toward goals    Frequency    Min 3X/week      PT Plan Current plan remains appropriate    Co-evaluation              AM-PAC PT "6 Clicks" Mobility   Outcome Measure  Help needed turning from your back to your side while in a flat bed without using bedrails?: A Little Help needed moving from lying on your back to sitting on the side of a flat bed without using  bedrails?: A Little Help needed moving to and from a bed to a chair (including a wheelchair)?: A Little Help needed standing up from a chair using your arms (e.g., wheelchair or bedside chair)?: A Little Help needed to walk in hospital room?: A Little Help needed climbing 3-5 steps with a railing? : A Little 6 Click Score: 18    End of Session   Activity Tolerance: Patient tolerated treatment well Patient left: in bed;with call bell/phone within reach;with family/visitor present;with bed alarm set   PT Visit Diagnosis: Unsteadiness on feet (R26.81);Muscle weakness (generalized) (M62.81);Difficulty in walking, not elsewhere classified (R26.2)     Time: [id number] PT Time Calculation (min) (ACUTE ONLY): 9 min  Charges:  $Gait Training: 8-22 mins                         Doreatha Massed, PT Acute Rehabilitation

## 2019-05-09 NOTE — Progress Notes (Signed)
Urology Progress Note   3 Days Post-Op from cystoscopy, left ureteral stone extraction with basket, left ureteral stent placement.   Subjective: Transferred to floor.  Feeling very well.  No acute events overnight.   She reports feeling much better.   AHDS UOP 3.4L with 800 overnight.  WBC 11 Cr 1.07 Continues on culture specific antibiotics.   Objective: Vital signs in last 24 hours: Temp:  [98.3 F (36.8 C)-99.2 F (37.3 C)] 98.5 F (36.9 C) (05/05 0619) Pulse Rate:  [91-103] 93 (05/05 0619) Resp:  [17-23] 22 (05/05 0619) BP: (131-162)/(48-82) 139/67 (05/05 0619) SpO2:  [94 %-98 %] 97 % (05/05 0619)  Intake/Output from previous day: 05/04 0701 - 05/05 0700 In: 377.9 [I.V.:277.9; IV Piggyback:100] Out: [id number] [Urine:3475] Intake/Output this shift: Total I/O In: 640 [P.O.:640] Out: -   Physical Exam:  General: Alert and oriented GU: Foley in place draining clear yellow urine with stent string taped to it.   Lab Results: Recent Labs    05/07/19 0343 05/08/19 0817 05/09/19 0325  HGB 8.6* 8.7* 9.1*  HCT 27.9* 27.4* 28.4*   Recent Labs    05/08/19 0817 05/09/19 0325  NA 141 143  K 4.0 3.9  CL 111 108  CO2 22 25  GLUCOSE 123* 123*  BUN 23 20  CREATININE 1.29* 1.07*  CALCIUM 8.8* 9.1    Studies/Results: No results found.  Assessment/Plan:  79 y.o. female with history of sarcoidosis, diabetes, hypertension, who presented with left flank discomfort and fever with a CT scan that demonstrated a 5 mm UVJ obstructing stone with associated left hydroureteronephrosis. Infected obstructing stone s/p left ureteroscopic basket extraction and stent placement on 05/06/19.   Infected obstructing ureteral calculi causing bacteremia Overall recovering well.  Clinically appears more alert and oriented.  Excellent urine output via Foley catheter.  Both blood and urine cultures growing E. coli.  Creatinine downtrending. Remains on ceftriaxone   Remove Foley catheter (with  ureteral stent string attached today).   Must have formal trial of void and document good bladder function.   Flomax for 48hr  Ditropan or belladonna opium suppositories for bladder spasms  Follow-up stone analysis  Agree with culture directed antibiotic therapy, transition to oral per hospitalist team.   LOS: 3 days

## 2019-05-09 NOTE — Care Management Important Message (Signed)
Important Message  Patient Details IM Letter given to Evette Cristal SW Case Manager to present to the Patient Name: Diane Wilkerson MRN: [id number] Date of Birth: [birth year]   Medicare Important Message Given:  Yes     Kerin Salen 05/09/2019, 10:29 AM

## 2019-05-09 NOTE — TOC Progression Note (Signed)
Transition of Care [facility]) - Progression Note    Patient Details  Name: Diane Wilkerson MRN: [id number] Date of Birth: May 06, 1940  Transition of Care [facility]) CM/SW Contact  Ross Ludwig, [place] Phone Number: 05/09/2019, 5:14 PM  Clinical Narrative:     CSW attempted to call patient in her room to discuss home health services, she did not answer her phone.  CSW then tried contacting patient's husband, message left on voice mail awaiting call back about home health services.      Expected Discharge Plan and Services                                                 Social Determinants of Health (SDOH) Interventions    Readmission Risk Interventions No flowsheet data found.

## 2019-05-09 NOTE — Progress Notes (Signed)
PROGRESS NOTE    Diane Wilkerson  [id number] DOB: 1940/03/24 DOA: 05/05/2019 PCP: Willey Blade, MD   Brief Narrative:  79 year old female with a past medical history significant for but not limited to diabetes mellitus type 2, history of pulmonary sarcoidosis, hypertension as well as other comorbidities who presented to the emergency room with a chief complaint of left flank pain and fever.  She was noted to have a temperature at home as well which was as high as 101.  She was seen in the ED and a CT of the abdomen pelvis was done and showed evidence of left-sided pyelonephritis with concern for passed calculus near the UVJ and left bladder.  She is noted to be septic with severe lactic acidosis, acute kidney injury suggestive of organ dysfunction as well as a left-sided pyelonephritis secondary to retained-year-old stone.  Urology was consulted and patient was emergently taken to the OR and underwent cystoscopy and stone extraction as well as a left ureteral stent placement and at that time she was found to have left-sided hydronephrosis as well.  She was started on broad-spectrum antibiotics and was admitted to stepdown unit.  Subsequently she was found to have an E. coli bacteremia and antibiotics have been now narrowed to IV Cefazolin.  Assessment & Plan:   Principal Problem:   Severe sepsis with acute organ dysfunction due to gram-negative bacteria (HCC) Active Problems:   Essential hypertension   Uncontrolled type 2 diabetes mellitus with hyperglycemia, without long-term current use of insulin (HCC)   AKI (acute kidney injury) (HCC)   Lactic acidosis   Hypokalemia, inadequate intake   Adrenal adenoma, right   Acute pyelonephritis   E coli bacteremia   Complicated UTI (urinary tract infection)   Left ureteral stone   Hydronephrosis, left   Folate deficiency   Iron deficiency anemia  Severe sepsis with acute organ dysfunction secondary to acute left pyelonephritis, complicated  UTI with left distal ureteral stone and left hydronephrosis. -Patient admitted meeting criteria for sepsis with acute organ dysfunction.   -Patient on admission noted to have a severe lactic acidosis, acute renal failure, left-sided pyelonephritis likely secondary to retained left ureteral stone, fever of 103.1, noted to be tachycardic with heart rate of 137, tachypneic with respiratory rate of 28 and initial presentation with a blood pressure of 96/58.   -CT abdomen and pelvis done suggestive that stone may have passed into the left bladder.   -Admitting physician discussed with urology and was felt there was not convincing CT evidence that stone may have passed and patient was taken emergently to the OR.   -Patient underwent cystoscopy, left basket extraction of ureteral stone, left retrograde pyelogram with interpretation, left ureteral stent placement and tolerated procedure well.   -Patient pancultured.  Blood culture positive for E. coli bacteremia.  Urine cultures with 40,000 colonies of E. coli.   -Lactic acidosis trending down.   -Procalcitonin elevated at > 150 on admission however trending down and currently trended further down to 55.47 -Patient received a dose of IV Rocephin and IV azithromycin in the ED.   -Patient received a dose of IV vancomycin and  IV Zosyn (05/06/2019).  -IV antibiotics have been narrowed down to IV Rocephin and further de-escalated to IV Cefazolin.   -Per urology leave Foley to be removed today    -If continued improvement could likely transition to oral antibiotics in the next 1 to 2 days and anticipate D/C home with Home Health PT. -Urology following and appreciate input  and recommendations.  E. coli bacteremia -Likely seeded from complicated UTI/left pyelonephritis.   -IV antibiotics were narrowed down from IV Zosyn to IV ceftriaxone and will further de-escalate down to IV cefazolin. -Needs at least a minimum of 7 days of treatment but does not necessarily  have to be IV; will discuss with ID about targeted therapy and potential discharge medication  AKI -Likely secondary to a post renal azotemia secondary to hydronephrosis in the setting of ureteral stone status post stone extraction and stent placement in the setting of sepsis secondary to pyelonephritis/complicated UTI and on chronic diuretic and ARB. -Per urology on review of records from care everywhere notes from January 2021 reporting a creatinine of 1.8, 1.0 in September 2020, 0.9 in June 2020.   -Creatinine on admission was 2.64.  Patient with renal function improving with creatinine currently at 1.29 from 1.68 from 2.67 from 2.64.   -Patient also noted to have a borderline blood pressure with systolics in the 0000000 on admission and noted to be septic.   -Urinalysis obtained on admission cloudy, moderate leukocytes, nitrite negative, 21-50 WBCs, many bacteria.   -Urine cultures with 40,000 colonies of E. Coli. -Saline lock IV fluids.   -Status post Foley catheter placement in the OR, patient with fairly good urine output -Patient looked volume overloaded on examination so she was Lasix 20 mg IV x1.   -Monitor urine output.   -Continue to hold ARB.  Could likely resume home regimen diuretics in the next 1 to 2 days.   -Will defer discontinuation of Foley catheter to Urology this is going to be done today and per urology she must have a formal trial of void and document good bladder function prior to discharge -Urology recommending continuing tamsulosin for 48 hours  Hypokalemia/Hypomagnesemia -Potassium at 3.9 this morning.  Magnesium at 1.8 -Continue to monitor and replete as necessary -Repeat CMP and Magnesium level in the a.m.  Transaminitis/Abnormal LFTs -Likely secondary to acute infection and dehydration and now improved -LFTs trending down and have normalized   Iron Deficiency Anemia/Folate Deficiency -Likely dilutional.  Repeat hemoglobin this morning was 9.1 and was 10.4 on  admission.   -Anemia panel obtained with a iron level of 7, ferritin 183, folate of 5.1.   -Continue folic acid 1 mg p.o. daily.  Follow H&H.   -Deferring IV iron during this hospitalization given active Infection.   -Transfusion threshold hemoglobin < 7.   -Will need oral iron supplementation on discharge.  Right Adrenal Adenoma -Incidental finding on CT imaging that appears benign.  Outpatient follow-up.  Hypertension -Continue to hold antihypertensive medications at this time. (Losartan-HCTZ) -Saline lock IV fluids. -Given IV Lasix yesterday -May resume Antihypertensives in the AM   Lactic Acidosis -Patient with a severe lactic acidosis likely secondary to sepsis and volume depletion.   -Lactic acid levels slowly trending down and now normalized.   -Saline lock IV fluids.   -Continue empiric IV antibiotics as above -Will not Repeat   Type 2 Diabetes Mellitus -Hemoglobin A1c 7.1 (05/05/2019).  CBG of 123 this AM   -C/w Carb modified diet.   -Sliding scale insulin.  Continue to hold oral hypoglycemic agents.   -CBG's ranging from 110-142  History of Pulmonary Sarcoidosis -Currently stable.  Outpatient follow-up.  Sinus Tachycardia, improved -Likely secondary to problem #1, E. coli bacteremia.   -Continued empiric IV antibiotics but de-escalated to IV Cefazolin. -Saline lock IV fluids. -Continue to Monitor closely and C/w Telemetry   Volume Overload -Patient had edematous  hands and had Some crackles noted on respiratory examination yesterday but is improved  -Likely secondary to aggressive hydration as patient was admitted with severe sepsis.   -Patient was positive 4.7 L and is now +1.485.  -Saline lock IV fluids.   -Given Lasix 20 mg IV x1 yesterday.   -Strict I's and O's. Daily weights. -Continue to Monitor Carefully  Obesity -Estimated body mass index is 39.88 kg/m as calculated from the following:   Height as of this encounter: 5\' 2"  (1.575 m).   Weight as  of this encounter: 98.9 kg. -Weight Loss and Dietary Counseling given   DVT prophylaxis: Enoxaparin 40 mg sq q24h Code Status: FULL CODE  Family Communication: Discussed with Husband at bedside  Disposition Plan: Patient is from home and likely will discharge home with home health PT in the next 24 to 48 hours when she is changed to p.o. antibiotics and cleared from a urological standpoint  Status is: Inpatient  Remains inpatient appropriate because:IV treatments appropriate due to intensity of illness or inability to take PO and Inpatient level of care appropriate due to severity of illness   Dispo: The patient is from: Home              Anticipated d/c is to: Home              Anticipated d/c date is: 1 day              Patient currently is not medically stable to d/c.  Consultants:   Urology    Procedures:  Procedure Done by Dr. Junious Silk on 05/06/19  1. Cystoscopy 2. Left basket extraction of ureteral stone 3. Left retrograde pyelogram with interpretation 4. Left ureteral stent placement (6Fr x 24cm) 5. Simple Foley catheter placement   Antimicrobials:  Anti-infectives (From admission, onward)   Start     Dose/Rate Route Frequency Ordered Stop   05/06/19 1800  piperacillin-tazobactam (ZOSYN) IVPB 3.375 g  Status:  Discontinued     3.375 g 12.5 mL/hr over 240 Minutes Intravenous Every 8 hours 05/06/19 1052 05/06/19 1558   05/06/19 1800  cefTRIAXone (ROCEPHIN) 2 g in sodium chloride 0.9 % 100 mL IVPB     2 g 200 mL/hr over 30 Minutes Intravenous Every 24 hours 05/06/19 1559     05/06/19 0245  vancomycin (VANCOREADY) IVPB 1750 mg/350 mL     1,750 mg 175 mL/hr over 120 Minutes Intravenous  Once 05/06/19 0237 05/06/19 1142   05/06/19 0200  piperacillin-tazobactam (ZOSYN) IVPB 3.375 g     3.375 g 100 mL/hr over 30 Minutes Intravenous  Once 05/06/19 0153 05/06/19 1052   05/05/19 2145  cefTRIAXone (ROCEPHIN) 2 g in sodium chloride 0.9 % 100 mL IVPB  Status:  Discontinued       2 g 200 mL/hr over 30 Minutes Intravenous Every 24 hours 05/05/19 2140 05/06/19 0155   05/05/19 2145  azithromycin (ZITHROMAX) 500 mg in sodium chloride 0.9 % 250 mL IVPB  Status:  Discontinued     500 mg 250 mL/hr over 60 Minutes Intravenous Every 24 hours 05/05/19 2140 05/06/19 0155     Subjective: Seen and examined at bedside and she was doing well.  No nausea or vomiting and states that she is feeling better.  Denies any pain currently.  No other concerns or complaints at this time.  Objective: Vitals:   05/08/19 2203 05/09/19 0228 05/09/19 0400 05/09/19 0619  BP: (!) 144/68 140/67  139/67  Pulse: 97 99  93  Resp: (!) 22 20 (!) 21 (!) 22  Temp: 99.2 F (37.3 C) 99.1 F (37.3 C)  98.5 F (36.9 C)  TempSrc: Oral Oral  Oral  SpO2: 98% 94%  97%  Weight:      Height:        Intake/Output Summary (Last 24 hours) at 05/09/2019 0815 Last data filed at 05/09/2019 [id number] Gross per 24 hour  Intake 377.92 ml  Output 3475 ml  Net -3097.08 ml   Filed Weights   05/05/19 2134 05/06/19 1134  Weight: 86.2 kg 98.9 kg   Examination: Physical Exam:  Constitutional: WN/WD obese female currently in NAD and appears calm and comfortable Eyes: Lids and conjunctivae normal, sclerae anicteric  ENMT: External Ears, Nose appear normal. Grossly normal hearing.   Neck: Appears normal, supple, no cervical masses, normal ROM, no appreciable thyromegaly: No JVD Respiratory: Diminished to auscultation bilaterally with unlabored breathing, no wheezing, rales, rhonchi or crackles. Normal respiratory effort and patient is not tachypenic. No accessory muscle use.  Cardiovascular: RRR, no murmurs / rubs / gallops. S1 and S2 auscultated.  Minimal extremity edema.  Abdomen: Soft, non-tender, distended secondary body habitus. Bowel sounds positive.  GU: Deferred. Musculoskeletal: No clubbing / cyanosis of digits/nails. No joint deformity upper and lower extremities.  Skin: No rashes, lesions, ulcers on a  limited skin evaluation. No induration; Warm and dry.  Neurologic: CN 2-12 grossly intact with no focal deficits. Romberg sign and cerebellar reflexes not assessed.  Psychiatric: Normal judgment and insight. Alert and oriented x 3. Normal mood and appropriate affect.   Data Reviewed: I have personally reviewed following labs and imaging studies  CBC: Recent Labs  Lab 05/05/19 2134 05/06/19 0742 05/07/19 0343 05/08/19 0817 05/09/19 0325  WBC 3.9* 16.9* 20.9* 18.5* 11.3*  NEUTROABS 3.5 15.1* 16.4* 14.6* 7.3  HGB 10.4* 8.9* 8.6* 8.7* 9.1*  HCT 31.9* 28.7* 27.9* 27.4* 28.4*  MCV 88.4 90.8 90.3 90.1 88.2  PLT 295 239 209 210 [id number]   Basic Metabolic Panel: Recent Labs  Lab 05/05/19 2134 05/06/19 0742 05/07/19 0343 05/08/19 0817 05/09/19 0325  NA 136 142 142 141 143  K 3.4* 4.0 4.1 4.0 3.9  CL 103 112* 112* 111 108  CO2 20* 22 21* 22 25  GLUCOSE 253* 167* 117* 123* 123*  BUN 40* 41* 34* 23 20  CREATININE 2.64* 2.67* 1.68* 1.29* 1.07*  CALCIUM 9.4 8.3* 8.4* 8.8* 9.1  MG  --  1.4* 2.3 1.9 1.8  PHOS  --   --  2.6  --   --    GFR: Estimated Creatinine Clearance: 49.1 mL/min (A) (by C-G formula based on SCr of 1.07 mg/dL (H)). Liver Function Tests: Recent Labs  Lab 05/05/19 2134 05/06/19 0742 05/07/19 0343 05/08/19 0817 05/09/19 0325  AST 117* 95* 46* 30 35  ALT 60* 60* 49* 41 42  ALKPHOS 102 53 55 65 80  BILITOT 0.7 0.6 0.7 0.5 0.8  PROT 6.5 5.8* 5.8* 6.1* 5.9*  ALBUMIN 3.8 3.2* 2.9* 3.0* 3.0*   No results for input(s): LIPASE, AMYLASE in the last 168 hours. No results for input(s): AMMONIA in the last 168 hours. Coagulation Profile: Recent Labs  Lab 05/05/19 2134  INR 1.1   Cardiac Enzymes: No results for input(s): CKTOTAL, CKMB, CKMBINDEX, TROPONINI in the last 168 hours. BNP (last 3 results) No results for input(s): PROBNP in the last 8760 hours. HbA1C: No results for input(s): HGBA1C in the last 72 hours. CBG: Recent Labs  Lab 05/08/19 1142  05/08/19 1822 05/08/19 2200 05/09/19 0616 05/09/19 0754  GLUCAP 129* 114* 141* 130* 142*   Lipid Profile: No results for input(s): CHOL, HDL, LDLCALC, TRIG, CHOLHDL, LDLDIRECT in the last 72 hours. Thyroid Function Tests: No results for input(s): TSH, T4TOTAL, FREET4, T3FREE, THYROIDAB in the last 72 hours. Anemia Panel: No results for input(s): VITAMINB12, FOLATE, FERRITIN, TIBC, IRON, RETICCTPCT in the last 72 hours. Sepsis Labs: Recent Labs  Lab 05/05/19 2357 05/06/19 0610 05/06/19 0742 05/07/19 0343 05/08/19 0225 05/09/19 0325  PROCALCITON  --   --  >150.00 >150.00 128.15 55.47  LATICACIDVEN 5.3* 3.4*  --  1.5  --  1.0    Recent Results (from the past 240 hour(s))  Culture, blood (Routine x 2)     Status: Abnormal   Collection Time: 05/05/19  9:34 PM   Specimen: BLOOD  Result Value Ref Range Status   Specimen Description   Final    BLOOD RIGHT WRIST Performed at [facility] [address] [place], [place] [address]    Special Requests   Final    BOTTLES DRAWN AEROBIC AND ANAEROBIC Blood Culture results may not be optimal due to an excessive volume of blood received in culture bottles Performed at [facility] [address], [place], [place] [address]    Culture  Setup Time   Final    GRAM NEGATIVE RODS IN BOTH AEROBIC AND ANAEROBIC BOTTLES CRITICAL RESULT CALLED TO, READ BACK BY AND VERIFIED WITH: Otila Back [id number] AT 66 B Y CM Performed at [facility], [place] [address], [place], [place] [address]    Culture ESCHERICHIA COLI (A)  Final   Report Status 05/08/2019 FINAL  Final   Organism ID, Bacteria ESCHERICHIA COLI  Final      Susceptibility   Escherichia coli - MIC*    AMPICILLIN >=32 RESISTANT Resistant     CEFAZOLIN <=4 SENSITIVE Sensitive     CEFEPIME <=1 SENSITIVE Sensitive     CEFTAZIDIME <=1 SENSITIVE Sensitive     CEFTRIAXONE <=1 SENSITIVE Sensitive     CIPROFLOXACIN <=0.25 SENSITIVE Sensitive      GENTAMICIN <=1 SENSITIVE Sensitive     IMIPENEM <=0.25 SENSITIVE Sensitive     TRIMETH/SULFA <=20 SENSITIVE Sensitive     AMPICILLIN/SULBACTAM 16 INTERMEDIATE Intermediate     PIP/TAZO <=4 SENSITIVE Sensitive     * ESCHERICHIA COLI  Blood Culture ID Panel (Reflexed)     Status: Abnormal   Collection Time: 05/05/19  9:34 PM  Result Value Ref Range Status   Enterococcus species NOT DETECTED NOT DETECTED Final   Listeria monocytogenes NOT DETECTED NOT DETECTED Final   Staphylococcus species NOT DETECTED NOT DETECTED Final   Staphylococcus aureus (BCID) NOT DETECTED NOT DETECTED Final   Streptococcus species NOT DETECTED NOT DETECTED Final   Streptococcus agalactiae NOT DETECTED NOT DETECTED Final   Streptococcus pneumoniae NOT DETECTED NOT DETECTED Final   Streptococcus pyogenes NOT DETECTED NOT DETECTED Final   Acinetobacter baumannii NOT DETECTED NOT DETECTED Final   Enterobacteriaceae species DETECTED (A) NOT DETECTED Final    Comment: Enterobacteriaceae represent a large family of gram-negative bacteria, not a single organism. CRITICAL RESULT CALLED TO, READ BACK BY AND VERIFIED WITH: PHARMD J LEGGE PD:[id number] AT 1545 BY CM    Enterobacter cloacae complex NOT DETECTED NOT DETECTED Final   Escherichia coli DETECTED (A) NOT DETECTED Final    Comment: CRITICAL RESULT CALLED TO, READ BACK BY AND VERIFIED WITH: PHARMD J LEGGE PD:[id number] AT  1545 BY CM    Klebsiella oxytoca NOT DETECTED NOT DETECTED Final   Klebsiella pneumoniae NOT DETECTED NOT DETECTED Final   Proteus species NOT DETECTED NOT DETECTED Final   Serratia marcescens NOT DETECTED NOT DETECTED Final   Carbapenem resistance NOT DETECTED NOT DETECTED Final   Haemophilus influenzae NOT DETECTED NOT DETECTED Final   Neisseria meningitidis NOT DETECTED NOT DETECTED Final   Pseudomonas aeruginosa NOT DETECTED NOT DETECTED Final   Candida albicans NOT DETECTED NOT DETECTED Final   Candida glabrata NOT DETECTED NOT DETECTED Final    Candida krusei NOT DETECTED NOT DETECTED Final   Candida parapsilosis NOT DETECTED NOT DETECTED Final   Candida tropicalis NOT DETECTED NOT DETECTED Final    Comment: Performed at [facility], [place] [address] [place], [place] [address]  Culture, blood (Routine x 2)     Status: Abnormal   Collection Time: 05/05/19  9:39 PM   Specimen: BLOOD  Result Value Ref Range Status   Specimen Description   Final    BLOOD RIGHT HAND Performed at [facility] [address] [place], [place] [address]    Special Requests   Final    BOTTLES DRAWN AEROBIC AND ANAEROBIC Blood Culture adequate volume Performed at [facility] [address], [place], [place] [address]    Culture  Setup Time   Final    GRAM NEGATIVE RODS AEROBIC BOTTLE ONLY CRITICAL VALUE NOTED.  VALUE IS CONSISTENT WITH PREVIOUSLY REPORTED AND CALLED VALUE.    Culture (A)  Final    ESCHERICHIA COLI SUSCEPTIBILITIES PERFORMED ON PREVIOUS CULTURE WITHIN THE LAST 5 DAYS. Performed at [facility], [place] [address], [place], [place] [address]    Report Status 05/08/2019 FINAL  Final  Urine culture     Status: Abnormal   Collection Time: 05/05/19  9:40 PM   Specimen: In/Out Cath Urine  Result Value Ref Range Status   Specimen Description   Final    IN/OUT CATH URINE Performed at [facility] [address] [place], [place] [address]    Special Requests   Final    NONE Performed at [facility], [place] [address] [place], [place] [address]    Culture 40,000 COLONIES/mL ESCHERICHIA COLI (A)  Final   Report Status 05/08/2019 FINAL  Final   Organism ID, Bacteria ESCHERICHIA COLI (A)  Final      Susceptibility   Escherichia coli - MIC*    AMPICILLIN >=32 RESISTANT Resistant     CEFAZOLIN <=4 SENSITIVE Sensitive     CEFTRIAXONE <=1 SENSITIVE Sensitive     CIPROFLOXACIN <=0.25 SENSITIVE Sensitive     GENTAMICIN <=1 SENSITIVE Sensitive      IMIPENEM <=0.25 SENSITIVE Sensitive     NITROFURANTOIN <=16 SENSITIVE Sensitive     TRIMETH/SULFA <=20 SENSITIVE Sensitive     AMPICILLIN/SULBACTAM 16 INTERMEDIATE Intermediate     PIP/TAZO <=4 SENSITIVE Sensitive     * 40,000 COLONIES/mL ESCHERICHIA COLI  Respiratory Panel by RT PCR (Flu A&B, Covid) - Nasopharyngeal Swab     Status: None   Collection Time: 05/05/19  9:41 PM   Specimen: Nasopharyngeal Swab  Result Value Ref Range Status   SARS Coronavirus 2 by RT PCR NEGATIVE NEGATIVE Final    Comment: (NOTE) SARS-CoV-2 target nucleic acids are NOT DETECTED. The SARS-CoV-2 RNA is generally detectable in upper respiratoy specimens during the acute phase of infection. The lowest concentration of SARS-CoV-2 viral copies this assay can  detect is 131 copies/mL. A negative result does not preclude SARS-Cov-2 infection and should not be used as the sole basis for treatment or other patient management decisions. A negative result may occur with  improper specimen collection/handling, submission of specimen other than nasopharyngeal swab, presence of viral mutation(s) within the areas targeted by this assay, and inadequate number of viral copies (<131 copies/mL). A negative result must be combined with clinical observations, patient history, and epidemiological information. The expected result is Negative. Fact Sheet for Patients:  PinkCheek.be Fact Sheet for Healthcare Providers:  GravelBags.it This test is not yet ap proved or cleared by the [place] FDA and  has been authorized for detection and/or diagnosis of SARS-CoV-2 by FDA under an Emergency Use Authorization (EUA). This EUA will remain  in effect (meaning this test can be used) for the duration of the COVID-19 declaration under Section 564(b)(1) of the Act, 21 U.S.C. section 360bbb-3(b)(1), unless the authorization is terminated or revoked sooner.    Influenza A by  PCR NEGATIVE NEGATIVE Final   Influenza B by PCR NEGATIVE NEGATIVE Final    Comment: (NOTE) The Xpert Xpress SARS-CoV-2/FLU/RSV assay is intended as an aid in  the diagnosis of influenza from Nasopharyngeal swab specimens and  should not be used as a sole basis for treatment. Nasal washings and  aspirates are unacceptable for Xpert Xpress SARS-CoV-2/FLU/RSV  testing. Fact Sheet for Patients: PinkCheek.be Fact Sheet for Healthcare Providers: GravelBags.it This test is not yet approved or cleared by the [place] FDA and  has been authorized for detection and/or diagnosis of SARS-CoV-2 by  FDA under an Emergency Use Authorization (EUA). This EUA will remain  in effect (meaning this test can be used) for the duration of the  Covid-19 declaration under Section 564(b)(1) of the Act, 21  U.S.C. section 360bbb-3(b)(1), unless the authorization is  terminated or revoked. Performed at [facility], [place] [address], [place], [place] [address]   Urine Culture     Status: None   Collection Time: 05/06/19  4:06 AM   Specimen: Urine, Cystoscope  Result Value Ref Range Status   Specimen Description   Final    URINE, RANDOM Performed at [facility] [address], [place], [place] [address]    Special Requests   Final    NONE Performed at [facility], [place] [address], [place], [place] [address]    Culture   Final    NO GROWTH Performed at [facility], [place] [address] [place], [place] [address]    Report Status 05/07/2019 FINAL  Final  MRSA PCR Screening     Status: None   Collection Time: 05/06/19 11:36 AM   Specimen: Nasal Mucosa; Nasopharyngeal  Result Value Ref Range Status   MRSA by PCR NEGATIVE NEGATIVE Final    Comment:        The GeneXpert MRSA Assay (FDA approved for NASAL specimens only), is one component of a comprehensive MRSA  colonization surveillance program. It is not intended to diagnose MRSA infection nor to guide or monitor treatment for MRSA infections. Performed at [facility], [place] [address], [place], [place] [address]      RN Pressure Injury Documentation:     Estimated body mass index is 39.88 kg/m as calculated from the following:   Height as of this encounter: 5\' 2"  (1.575 m).   Weight as of this encounter: 98.9 kg.  Malnutrition Type:      Malnutrition  Characteristics:      Nutrition Interventions:    Radiology Studies: No results found.  Scheduled Meds: . Chlorhexidine Gluconate Cloth  6 each Topical Daily  . enoxaparin (LOVENOX) injection  40 mg Subcutaneous Q24H  . folic acid  1 mg Oral Daily  . insulin aspart  0-15 Units Subcutaneous Q6H  . naphazoline-glycerin  2 drop Both Eyes TID  . oxybutynin  5 mg Oral TID  . tamsulosin  0.4 mg Oral Daily   Continuous Infusions: . cefTRIAXone (ROCEPHIN)  IV Stopped (05/08/19 1819)    LOS: 3 days   Kerney Elbe, DO [facility] PAGER is on AMION  If 7PM-7AM, please contact night-coverage [email or web address]

## 2019-05-09 NOTE — Progress Notes (Signed)
Occupational Therapy Treatment Patient Details Name: Diane Wilkerson MRN: [id number] DOB: 30-May-1940 Today's Date: 05/09/2019    History of present illness Patient is 79 y.o. female with history of sarcoidosis, diabetes, hypertension, who presented with left flank discomfort and fever with a CT scan that demonstrated a 5 mm UVJ obstructing stone with associated left hydroureteronephrosis. Infected obstructing stone s/p left ureteroscopic basket extraction and stent placement on 05/06/19. Cultures returned positive for e. coli.    OT comments  Ms. Solecki continues to present with decreased activity tolerance and mild imbalance compared to normal. Patient demonstrated ability to ambulate with hand hold and perform toileting and washing hands at sink with cga. Patient reports she continues to feel weak and unable to tolerate a lot of activity. Patient educated on energy conservation strategies for return home. Therapist recommended shower chair for home.  Follow Up Recommendations  No OT follow up    Equipment Recommendations  Tub/shower seat    Recommendations for Other Services      Precautions / Restrictions Precautions Precautions: Fall Restrictions Weight Bearing Restrictions: No Other Position/Activity Restrictions: decreased activity tolerance       Mobility Bed Mobility Overal bed mobility: Needs Assistance Bed Mobility: Supine to Sit;Sit to Supine     Supine to sit: Supervision;HOB elevated Sit to supine: Supervision;HOB elevated   General bed mobility comments: supervision for bed transfers.  Transfers Overall transfer level: Needs assistance Equipment used: Rolling walker (2 wheeled) Transfers: Sit to/from Stand Sit to Stand: Min guard         General transfer comment: Min assist for hand hold for in room ambulation and ambulation to bathroom. Patient reports one loss of balance but able to correct.    Balance Overall balance assessment: Mild deficits observed,  not formally tested           Standing balance-Leahy Scale: Fair                             ADL either performed or assessed with clinical judgement   ADL       Grooming: Supervision/safety(Patient supervision to stand at sink to wash hands.)                   Toilet Transfer: Min guard(Patient cga assist to perform toilet transfer.)   Toileting- Clothing Manipulation and Hygiene: Min guard(cga to perform toileting.)         General ADL Comments: During in room ambulation and toileting task patient educated on energy conservation strategies for return home. patient reports decreased tolerance compared to normal. Patient verbalized understanding. Discussed need for shower chair for return home.     Vision       Perception     Praxis      Cognition Arousal/Alertness: Awake/alert Behavior During Therapy: WFL for tasks assessed/performed Overall Cognitive Status: Within Functional Limits for tasks assessed                                          Exercises     Shoulder Instructions       General Comments      Pertinent Vitals/ Pain       Pain Assessment: No/denies pain  Home Living  Prior Functioning/Environment              Frequency  Min 2X/week        Progress Toward Goals  OT Goals(current goals can now be found in the care plan section)        Plan      Co-evaluation                 AM-PAC OT "6 Clicks" Daily Activity     Outcome Measure                    End of Session Equipment Utilized During Treatment: Gait belt  OT Visit Diagnosis: Unsteadiness on feet (R26.81);Muscle weakness (generalized) (M62.81)   Activity Tolerance Patient tolerated treatment well   Patient Left in bed;with call bell/phone within reach;with bed alarm set;with family/visitor present   Nurse Communication Mobility status        Time:  [id number] OT Time Calculation (min): 11 min  Charges: OT Treatments $Self Care/Home Management : 8-22 mins  Derl Barrow, OTR/L [place]  Office [phone]    Lenward Chancellor 05/09/2019, 4:19 PM

## 2019-05-10 LAB — COMPREHENSIVE METABOLIC PANEL
ALT: 36 U/L (ref 0–44)
AST: 33 U/L (ref 15–41)
Albumin: 3 g/dL — ABNORMAL LOW (ref 3.5–5.0)
Alkaline Phosphatase: 73 U/L (ref 38–126)
Anion gap: 12 (ref 5–15)
BUN: 16 mg/dL (ref 8–23)
CO2: 24 mmol/L (ref 22–32)
Calcium: 9.3 mg/dL (ref 8.9–10.3)
Chloride: 104 mmol/L (ref 98–111)
Creatinine, Ser: 1.09 mg/dL — ABNORMAL HIGH (ref 0.44–1.00)
GFR calc Af Amer: 57 mL/min — ABNORMAL LOW (ref >60)
GFR calc non Af Amer: 49 mL/min — ABNORMAL LOW (ref >60)
Glucose, Bld: 135 mg/dL — ABNORMAL HIGH (ref 70–99)
Potassium: 3.6 mmol/L (ref 3.5–5.1)
Sodium: 140 mmol/L (ref 135–145)
Total Bilirubin: 0.6 mg/dL (ref 0.3–1.2)
Total Protein: 6 g/dL — ABNORMAL LOW (ref 6.5–8.1)

## 2019-05-10 LAB — CBC WITH DIFFERENTIAL/PLATELET
Abs Immature Granulocytes: 0.68 10*3/uL — ABNORMAL HIGH (ref 0.00–0.07)
Basophils Absolute: 0.1 10*3/uL (ref 0.0–0.1)
Basophils Relative: 1 %
Eosinophils Absolute: 0.3 10*3/uL (ref 0.0–0.5)
Eosinophils Relative: 4 %
HCT: 29 % — ABNORMAL LOW (ref 36.0–46.0)
Hemoglobin: 9.2 g/dL — ABNORMAL LOW (ref 12.0–15.0)
Immature Granulocytes: 9 %
Lymphocytes Relative: 26 %
Lymphs Abs: 2 10*3/uL (ref 0.7–4.0)
MCH: 28.3 pg (ref 26.0–34.0)
MCHC: 31.7 g/dL (ref 30.0–36.0)
MCV: 89.2 fL (ref 80.0–100.0)
Monocytes Absolute: 1 10*3/uL (ref 0.1–1.0)
Monocytes Relative: 14 %
Neutro Abs: 3.4 10*3/uL (ref 1.7–7.7)
Neutrophils Relative %: 46 %
Platelets: 283 10*3/uL (ref 150–400)
RBC: 3.25 MIL/uL — ABNORMAL LOW (ref 3.87–5.11)
RDW: 14.6 % (ref 11.5–15.5)
WBC: 7.4 10*3/uL (ref 4.0–10.5)
nRBC: 0.3 % — ABNORMAL HIGH (ref 0.0–0.2)

## 2019-05-10 LAB — GLUCOSE, CAPILLARY
Glucose-Capillary: 143 mg/dL — ABNORMAL HIGH (ref 70–99)
Glucose-Capillary: 119 mg/dL — ABNORMAL HIGH (ref 70–99)
Glucose-Capillary: 137 mg/dL — ABNORMAL HIGH (ref 70–99)
Glucose-Capillary: 100 mg/dL — ABNORMAL HIGH (ref 70–99)

## 2019-05-10 LAB — MAGNESIUM: Magnesium: 1.7 mg/dL (ref 1.7–2.4)

## 2019-05-10 LAB — PHOSPHORUS: Phosphorus: 3.7 mg/dL (ref 2.5–4.6)

## 2019-05-10 LAB — PROCALCITONIN: Procalcitonin: 26.71 ng/mL

## 2019-05-10 MED ORDER — FOLIC ACID 1 MG PO TABS: 1.0000 mg | ORAL_TABLET | Freq: Every day | ORAL | 0 refills | Status: DC

## 2019-05-10 MED ORDER — ONDANSETRON HCL 4 MG PO TABS: 4.0000 mg | ORAL_TABLET | Freq: Four times a day (QID) | ORAL | 0 refills | Status: DC | PRN

## 2019-05-10 MED ORDER — CEFDINIR 300 MG PO CAPS
300.0000 mg | ORAL_CAPSULE | Freq: Two times a day (BID) | ORAL | Status: DC
  Administered 2019-05-10: 300 mg via ORAL
  Filled 2019-05-10 (×4): qty 1

## 2019-05-10 MED ORDER — POLYETHYLENE GLYCOL 3350 17 G PO PACK: 17.0000 g | PACK | Freq: Every day | ORAL | 0 refills | Status: DC | PRN

## 2019-05-10 MED ORDER — NAPHAZOLINE-GLYCERIN 0.012-0.2 % OP SOLN: 2.0000 [drp] | Freq: Three times a day (TID) | OPHTHALMIC | 0 refills | Status: DC

## 2019-05-10 MED ORDER — BENZOCAINE 10 % MT GEL
Freq: Two times a day (BID) | OROMUCOSAL | Status: DC | PRN
  Filled 2019-05-10: qty 9.4

## 2019-05-10 MED ORDER — ALUM & MAG HYDROXIDE-SIMETH 200-200-20 MG/5ML PO SUSP
30.0000 mL | ORAL | Status: DC | PRN
  Administered 2019-05-10: 30 mL via ORAL
  Filled 2019-05-10: qty 30

## 2019-05-10 MED ORDER — BENZOCAINE 10 % MT GEL: Freq: Two times a day (BID) | OROMUCOSAL | 0 refills | Status: DC | PRN

## 2019-05-10 MED ORDER — MAGNESIUM SULFATE IN D5W 1-5 GM/100ML-% IV SOLN
1.0000 g | Freq: Once | INTRAVENOUS | Status: AC
  Administered 2019-05-10: 1 g via INTRAVENOUS
  Filled 2019-05-10: qty 100

## 2019-05-10 MED ORDER — MAGIC MOUTHWASH W/LIDOCAINE
5.0000 mL | Freq: Four times a day (QID) | ORAL | Status: DC
  Administered 2019-05-10 (×2): 5 mL via ORAL
  Filled 2019-05-10 (×3): qty 5

## 2019-05-10 MED ORDER — CEFDINIR 300 MG PO CAPS: 300.0000 mg | ORAL_CAPSULE | Freq: Two times a day (BID) | ORAL | 0 refills | Status: AC

## 2019-05-10 NOTE — Progress Notes (Signed)
Urology Progress Note   4 Days Post-Op from cystoscopy, left ureteral stone extraction with basket, left ureteral stent placement.   Subjective: No acute events overnight.     AHDS Voiding spontaneously.  WBC 7.4 Cr 1.09 Continues on culture specific antibiotics.   Objective: Vital signs in last 24 hours: Temp:  [98.4 F (36.9 C)-98.9 F (37.2 C)] 98.4 F (36.9 C) (05/06 0506) Pulse Rate:  [90-96] 90 (05/06 0506) Resp:  [17-19] 17 (05/06 0506) BP: (147-167)/(67-86) 147/78 (05/06 0506) SpO2:  [96 %-97 %] 97 % (05/06 0506)  Intake/Output from previous day: 05/05 0701 - 05/06 0700 In: 880 [P.O.:880] Out: 1175 [Urine:1175] Intake/Output this shift: No intake/output data recorded.  Physical Exam:  General: Alert and oriented GU: Voiding spontaneously  Lab Results: Recent Labs    05/08/19 0817 05/09/19 0325 05/10/19 0330  HGB 8.7* 9.1* 9.2*  HCT 27.4* 28.4* 29.0*   Recent Labs    05/09/19 0325 05/10/19 0330  NA 143 140  K 3.9 3.6  CL 108 104  CO2 25 24  GLUCOSE 123* 135*  BUN 20 16  CREATININE 1.07* 1.09*  CALCIUM 9.1 9.3    Studies/Results: No results found.  Assessment/Plan:  79 y.o. female with history of sarcoidosis, diabetes, hypertension, who presented with left flank discomfort and fever with a CT scan that demonstrated a 5 mm UVJ obstructing stone with associated left hydroureteronephrosis. Infected obstructing stone s/p left ureteroscopic basket extraction and stent placement on 05/06/19.   Infected obstructing ureteral calculi causing bacteremia Overall recovering well.  Clinically appears more alert and oriented.  Excellent urine output via Foley catheter.  Both blood and urine cultures growing E. coli.  Creatinine downtrending. Remains on ceftriaxone   Agree with culture directed antibiotic therapy, transition to oral per hospitalist team.  Follow-up stone analysis  Reasonable for discharge today from urologic perspective.    LOS: 4 days

## 2019-05-10 NOTE — TOC Transition Note (Signed)
Transition of Care [facility]) - CM/SW Discharge Note   Patient Details  Name: CASY NIKOLAS MRN: [id number] Date of Birth: Aug 21, 1940  [facility]) CM/SW Contact:  Ross Ludwig, LCSW Phone Number: 05/10/2019, 5:07 PM   Clinical Narrative:    Patient will be going home with home health through [facility].  CSW signing off please reconsult with any other social work needs, home health agency has been notified of planned discharge.  Patient required a walker, walker was ordered for her.    Final next level of care: Home w Home Health Services Barriers to Discharge: No Barriers Identified   Patient Goals and CMS Choice Patient states their goals for this hospitalization and ongoing recovery are:: To return back home with her husband with home health services. CMS Medicare.gov Compare Post Acute Care list provided to:: Patient Choice offered to / list presented to : Patient, Spouse  Discharge Placement                       Discharge Plan and Services                DME Arranged: Walker rolling DME Agency: [employer] Date DME Agency Contacted: 05/10/19 Time DME Agency Contacted: [phone] Representative spoke with at DME Agency: Thedore Mins HH Arranged: RN, PT [place] Agency: [facility]     Representative spoke with at [place]: [place] ([place]) Interventions     Readmission Risk Interventions No flowsheet data found.

## 2019-05-10 NOTE — Discharge Summary (Signed)
Physician Discharge Summary  Diane Wilkerson ATF:[id number] DOB: [birth year] DOA: 05/05/2019  PCP: Willey Blade, MD  Admit date: 05/05/2019 Discharge date: 05/10/2019  Admitted From: Home Disposition:  Home with Home Health  Recommendations for Outpatient Follow-up:  1. Follow up with PCP in 1-2 weeks 2. Follow up with Urology within 1-2 weeks 3. Please obtain CMP/CBC, Mag, Phos in one week 4. Folllow up on Adrenal Adenoma in the outpatient setting  5. Please follow up on the following pending results:  Home Health: YES Equipment/Devices: RW with 5" Wheels  Discharge Condition: Stable  CODE STATUS: FULL CODE Diet recommendation: Soft Heart Healthy Carb Modified Diet   Brief/Interim Summary: The patient is a 79 year old female with a past medical history significant for but not limited to diabetes mellitus type 2, history of pulmonary sarcoidosis, hypertension as well as other comorbidities who presented to the emergency room with a chief complaint of left flank pain and fever.  She was noted to have a temperature at home as well which was as high as 101.  She was seen in the ED and a CT of the abdomen pelvis was done and showed evidence of left-sided pyelonephritis with concern for passed calculus near the UVJ and left bladder.  She is noted to be septic with severe lactic acidosis, acute kidney injury suggestive of organ dysfunction as well as a left-sided pyelonephritis secondary to retained-year-old stone.    Urology was consulted and patient was emergently taken to the OR and underwent cystoscopy and stone extraction as well as a left ureteral stent placement and at that time she was found to have left-sided hydronephrosis as well.  She was started on broad-spectrum antibiotics and was admitted to stepdown unit.  Subsequently she was found to have an E. coli bacteremia and antibiotics have been now narrowed to IV Cefazolin and she is improved and stable to be D/C'd as her foley has been  removed and she is urinating well. She will be changed to po Abx to complete 7 days treatment and will need to follow up with PCP and Urology within 1-2 weeks.   Discharge Diagnoses:  Principal Problem:   Severe sepsis with acute organ dysfunction due to gram-negative bacteria (HCC) Active Problems:   Essential hypertension   Uncontrolled type 2 diabetes mellitus with hyperglycemia, without long-term current use of insulin (HCC)   AKI (acute kidney injury) (HCC)   Lactic acidosis   Hypokalemia, inadequate intake   Adrenal adenoma, right   Acute pyelonephritis   E coli bacteremia   Complicated UTI (urinary tract infection)   Left ureteral stone   Hydronephrosis, left   Folate deficiency   Iron deficiency anemia  Severe sepsis with acute organ dysfunction secondary to acute left pyelonephritis, complicated UTI with left distal ureteral stone and left hydronephrosis. -Patient admitted meeting criteria for sepsis with acute organ dysfunction.  -Patient on admission noted to have a severe lactic acidosis, acute renal failure, left-sided pyelonephritis likely secondary to retained left ureteral stone, fever of 103.1, noted to be tachycardic with heart rate of 137, tachypneic with respiratory rate of 28 and initial presentation with a blood pressure of 96/58.  -CT abdomen and pelvis done suggestive that stone may have passed into the left bladder.  -Admitting physician discussed with urology and was felttherewas not convincing CT evidence that stone may have passed and patient was taken emergently to the OR.  -Patient underwent cystoscopy, left basket extraction of ureteral stone, left retrograde pyelogram with interpretation, left ureteral stent  placement and tolerated procedure well.  -Patient pancultured. Blood culture positive for E. coli bacteremia.Urine cultureswith 40,000 colonies of E. coli. -Lactic acidosis trending down.  -Procalcitonin elevated at >150on admission  however trending down and currently trended further down to 26.71 -Patient received a dose of IV Rocephin and IV azithromycin in the ED.  -Patient received a dose of IV vancomycin and IV Zosyn (05/06/2019).  -IV antibiotics have been narrowed down to IV Rocephin and further de-escalated to IV Cefazolin.  -Per urology leave Foley to be removed today  -Had continued improvement so will transition to oral antibiotics and D/C home with Home Health PT. -Urology following and appreciate input and recommendations and fell she can be discharged Home.  E. coli bacteremia -Likely seeded from complicated UTI/left pyelonephritis.  -IV antibiotics were narrowed down from IV Zosyn to IV ceftriaxone and will further de-escalate down to IV cefazolin. -Needs at least a minimum of 7 days of treatment but does not necessarily have to be IV and will change to po Cefdinir for 3 more days   AKI -Likely secondary to a post renal azotemia secondary to hydronephrosis in the setting of ureteral stone status post stone extraction and stent placement in the setting of sepsis secondary to pyelonephritis/complicated UTI and on chronic diuretic and ARB. -Per urology on review of records from care everywhere notes from January 2021 reporting a creatinine of 1.8, 1.0 in September 2020, 0.9 in June 2020.  -Creatinine on admission was 2.64. Patient with renal function improving with creatinine currently at 1.09 from1.68 from 2.67 from 2.64.  -Patient also noted to have a borderline blood pressure with systolics in the 33I on admission and noted to be septic.  -Urinalysis obtained on admission cloudy, moderate leukocytes, nitrite negative, 21-50 WBCs, many bacteria.  -Urine cultures with 40,000 colonies of E. Coli. -Saline lock IV fluids.  -Status post Foley catheter placement in the OR, patient with fairly good urine output -Patient looked volume overloaded on examination so she was Lasix 20 mg IV x1.  -Monitor  urine output.  -Continue to hold ARB while hospitalized but will resume at D/C now  - Foley catheter removed by Urology and this was done yesterday  -Urology recommending stopping Tamsulosin, B&O Suppositories, and Oxybutynin   Hypokalemia/Hypomagnesemia -Potassium at 3.6this morning. Magnesium at 1.87 -Continue to monitor and replete as necessary -Repeat CMP and Magnesium level in the a.m.  Transaminitis/Abnormal LFTs -Likely secondary to acute infection and dehydration and now improved -LFTs trending down and have normalized   Iron Deficiency Anemia/Folate Deficiency -Likely dilutional. Repeat hemoglobin this morning was 9.2 and was 10.4 on admission.  -Anemia panel obtained with a iron level of 7, ferritin 183, folate of 5.1.  -Continuefolic acid 1 mg p.o. daily. Follow H&H.  -Deferring IV iron during this hospitalization given active Infection.  -Transfusion threshold hemoglobin <7. -Will need oral iron supplementation on discharge.  Right Adrenal Adenoma -Incidental finding on CT imaging that appears benign. Outpatient follow-up.  Hypertension -Continue to hold antihypertensive medications at this time. (Losartan-HCTZ) -Saline lock IV fluids. -Given IV Lasix the day before yesterday  -Will resume Antihypertensives at D/C  Lactic Acidosis -Patient with a severe lactic acidosis likely secondary to sepsis and volume depletion.  -Lactic acid levels slowly trending down and now normalized.  -Saline lock IV fluids.  -Continue empiric IV antibiotics as above -Will not Repeat   Type 2 Diabetes Mellitus -Hemoglobin A1c 7.1 (05/05/2019). CBG of 135 this AM  -C/w Carb modified diet.  -Sliding  scale insulin. Continue to hold oral hypoglycemic agents whole hospitalized and resume at D/C.  -CBG's ranging from 110-142  History of Pulmonary Sarcoidosis -Currently stable. Outpatient follow-up.  Sinus Tachycardia, improved -Likely secondary to problem  #1, E. coli bacteremia.  -Continued empiric IV antibiotics but de-escalated to IV Cefazolin. -Saline lock IV fluids. -Continue to Monitor closely and C/w Telemetry   Volume Overload -Patient had edematous hands and had Some crackles noted on respiratory examination yesterday but is improved  -Likely secondary to aggressive hydration as patient was admitted with severe sepsis.  -Patient was positive 4.7 L and is now +1.075 L -Saline lock IV fluids.  -Given Lasix 20 mg IV x1 the day before yesterday .  -Strict I's and O's. Daily weights. -Continue to Monitor Carefully  Obesity -Estimated body mass index is 39.88 kg/m as calculated from the following:   Height as of this encounter: '5\' 2"'$  (1.575 m).   Weight as of this encounter: 98.9 kg. -Weight Loss and Dietary Counseling given   Mouth and Lip Sore/Ulcer -Try Benzocaine and Magic Mouthwash -Felt improvement with regimen prior to D/C   Discharge Instructions  Discharge Instructions    Ambulatory referral to Physical Therapy   Complete by: As directed    Call MD for:  difficulty breathing, headache or visual disturbances   Complete by: As directed    Call MD for:  extreme fatigue   Complete by: As directed    Call MD for:  hives   Complete by: As directed    Call MD for:  persistant dizziness or light-headedness   Complete by: As directed    Call MD for:  persistant nausea and vomiting   Complete by: As directed    Call MD for:  redness, tenderness, or signs of infection (pain, swelling, redness, odor or green/yellow discharge around incision site)   Complete by: As directed    Call MD for:  severe uncontrolled pain   Complete by: As directed    Call MD for:  temperature >100.4   Complete by: As directed    Diet - low sodium heart healthy   Complete by: As directed    Discharge instructions   Complete by: As directed    You were cared for by a hospitalist during your hospital stay. If you have any questions about  your discharge medications or the care you received while you were in the hospital after you are discharged, you can call the unit and ask to speak with the hospitalist on call if the hospitalist that took care of you is not available. Once you are discharged, your primary care physician will handle any further medical issues. Please note that NO REFILLS for any discharge medications will be authorized once you are discharged, as it is imperative that you return to your primary care physician (or establish a relationship with a primary care physician if you do not have one) for your aftercare needs so that they can reassess your need for medications and monitor your lab values.  Follow up with PCP and Urology within 1-2 weeks. Take all medications as prescribed. If symptoms change or worsen please return to the ED for evaluation   Increase activity slowly   Complete by: As directed      Allergies as of 05/10/2019   No Known Allergies     Medication List    TAKE these medications   benzocaine 10 % mucosal gel Commonly known as: ORAJEL Use as directed in the mouth  or throat 2 (two) times daily as needed for mouth pain.   cefdinir 300 MG capsule Commonly known as: OMNICEF Take 1 capsule (300 mg total) by mouth every 12 (twelve) hours for 6 doses.   folic acid 1 MG tablet Commonly known as: FOLVITE Take 1 tablet (1 mg total) by mouth daily.   glipizide-metformin 2.5-250 MG tablet Commonly known as: METAGLIP Take 1 tablet by mouth 2 (two) times daily.   losartan-hydrochlorothiazide 100-25 MG tablet Commonly known as: HYZAAR Take 1 tablet by mouth daily.   naphazoline-glycerin 0.012-0.2 % Soln Commonly known as: CLEAR EYES REDNESS Place 2 drops into both eyes 3 (three) times daily.   ondansetron 4 MG tablet Commonly known as: ZOFRAN Take 1 tablet (4 mg total) by mouth every 6 (six) hours as needed for nausea.   polyethylene glycol 17 g packet Commonly known as: MIRALAX /  GLYCOLAX Take 17 g by mouth daily as needed for mild constipation.            Durable Medical Equipment  (From admission, onward)         Start     Ordered   05/10/19 1312  For home use only DME Walker rolling  Once    Comments: [employer]  Question Answer Comment  Walker: With Owen Wheels   Patient needs a walker to treat with the following condition Generalized weakness      05/10/19 1314         Follow-up Information    Festus Aloe, MD.   Specialty: Urology Why: If symptoms worsen Contact information: [address] [place] [id number] [phone]          No Known Allergies  Consultations:  Urology  Procedures/Studies: CT ABDOMEN PELVIS WO CONTRAST  Result Date: 05/06/2019 CLINICAL DATA:  Persistent cough, lethargy and chills, abdominal distension and right flank pain with acute kidney injury and sepsis EXAM: CT CHEST, ABDOMEN AND PELVIS WITHOUT CONTRAST TECHNIQUE: Multidetector CT imaging of the chest, abdomen and pelvis was performed following the standard protocol without IV contrast. COMPARISON:  Chest radiograph 05/05/2019 FINDINGS: CT CHEST FINDINGS Cardiovascular: Luminal evaluation of vasculature precluded in the absence of contrast media. Hypoattenuation of the cardiac blood pool relative to the myocardium suggests a relative anemia. Normal cardiac size. No pericardial effusion. Calcifications are present on the mitral annulus. Few coronary artery calcifications are present. Suspect calcification of the aortic leaflets as well. Atherosclerotic plaque within the normal caliber aorta. An shared origin of the brachiocephalic and left common carotid artery. Minimal calcification in the proximal great vessels. Central pulmonary arteries are normal caliber. Mediastinum/Nodes: No mediastinal fluid or gas. Diminutive appearance of the right thyroid lobe, could reflect prior thyroid surgery. Thyroid gland and thoracic inlet are otherwise  unremarkable. No acute abnormality of the trachea. Small sliding-type hiatal hernia. No worrisome mediastinal or axillary adenopathy. Hilar nodal evaluation is limited in the absence of intravenous contrast media. Lungs/Pleura: Coronal imaging demonstrates mild cephalad redistribution of the pulmonary vascularity with interlobular septal thickening towards the apices and lung bases. No focal consolidation, pneumothorax or effusion. Dependent areas of atelectasis are present with additional bandlike opacities in the right middle lobe and both lung bases compatible with scarring and or atelectasis. No concerning pulmonary nodules or masses. Musculoskeletal: No chest wall mass or suspicious bone lesions identified. Multilevel degenerative changes are present in the imaged portions of the spine. Minimal degenerative changes in bilateral glenohumeral joints. CT ABDOMEN PELVIS FINDINGS Hepatobiliary: Diffuse hepatic hypoattenuation compatible  with hepatic steatosis. Focal fatty sparing along the gallbladder fossa. Normal gallbladder. No visible calcified gallstones. No biliary ductal dilatation. Pancreas: Unremarkable. No pancreatic ductal dilatation or surrounding inflammatory changes. Spleen: Small calcification in the splenic hilum could reflect a calcified granuloma or vascular calcification. No worrisome splenic lesion. Normal splenic size. 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 Additional mild nonspecific thickening of the left adrenal gland without concerning dominant nodule. The kidneys are orthotopic. Bilateral fluid attenuation renal cysts are present, in the right kidney measures up to 3.8 cm, in the left measuring up to 2.4 cm. No concerning renal masses. There is bilateral perinephric stranding which is slightly asymmetrically increased on the left with mild pelviectasis and ureterectasis  to the level of the urinary bladder. A punctate radiodensity is seen along the left posterolateral margin of the urinary bladder separate from the UV junction though could reflect a recently passed calculus. Additional nonobstructing calculus versus vascular calcifications seen in the lower pole left kidney as well. Stomach/Bowel: Small hiatal hernia, as above. Distal stomach and duodenum are unremarkable with normal course of the duodenum across the midline abdomen. No small bowel dilatation or wall thickening. A normal appendix is visualized. No colonic dilatation or wall thickening. Scattered colonic diverticula without focal pericolonic inflammation to suggest diverticulitis. No evidence of mechanical bowel obstruction. Vascular/Lymphatic: Atherosclerotic plaque within the normal caliber aorta. No pathologically enlarged abdominopelvic lymph nodes. Reproductive: Anteverted uterus with partially calcified anterior lobular probable fibroid measuring up to 6 cm in size. No concerning adnexal lesions. Other: No abdominopelvic free fluid or free gas. No bowel containing hernias. Small fat containing umbilical hernia. Musculoskeletal: Mild levocurvature of the lumbar spine, apex L4. Multilevel degenerative changes are present in the imaged portions of the spine. Additional degenerative changes at the SI joints, bilateral hips and symphysis pubis. No acute osseous abnormality or suspicious osseous lesion. IMPRESSION: 1. Features of vascular congestion with early/mild interstitial edema. No other acute intrathoracic abnormality. 2. Punctate radiodensity in the left posterolateral bladder, separate from the left UVJ. Could reflect a recently passed calculus with mild left pelviectasis and ureterectasis with some asymmetric left renal stranding. 3. Additional nonobstructing calculus versus vascular calcifications in the lower pole left kidney as well. 4. Hepatic steatosis. 5. 1.6 cm low-attenuation nodule in the right  adrenal gland most compatible with benign adenoma. 6. Colonic diverticulosis without evidence of diverticulitis. 7. Fibroid uterus. 8. Small sliding-type hiatal hernia. 9. Hypoattenuation of the cardiac blood pool suggests anemia. 10. Aortic Atherosclerosis (ICD10-I70.0). Electronically Signed   By: Lovena Le M.D.   On: 05/06/2019 00:55   DG Chest 2 View  Result Date: 05/05/2019 CLINICAL DATA:  Lethargy and chills. EXAM: CHEST - 2 VIEW COMPARISON:  None. FINDINGS: Very mild, diffusely increased lung markings are seen without evidence of acute infiltrate, pleural effusion or pneumothorax. The heart size and mediastinal contours are within normal limits. Very mild prominence of the right hilum is seen. The visualized skeletal structures are unremarkable. IMPRESSION: 1. Very mild, diffusely increased lung markings which are likely chronic in nature. 2. Mild prominence of the right hilum which may be vascular in origin. Mild lymphadenopathy cannot be excluded. Electronically Signed   By: Virgina Norfolk M.D.   On: 05/05/2019 22:32   CT Chest Wo Contrast  Result Date: 05/06/2019 CLINICAL DATA:  Persistent cough, lethargy and chills, abdominal distension and right flank pain with acute kidney injury  and sepsis EXAM: CT CHEST, ABDOMEN AND PELVIS WITHOUT CONTRAST TECHNIQUE: Multidetector CT imaging of the chest, abdomen and pelvis was performed following the standard protocol without IV contrast. COMPARISON:  Chest radiograph 05/05/2019 FINDINGS: CT CHEST FINDINGS Cardiovascular: Luminal evaluation of vasculature precluded in the absence of contrast media. Hypoattenuation of the cardiac blood pool relative to the myocardium suggests a relative anemia. Normal cardiac size. No pericardial effusion. Calcifications are present on the mitral annulus. Few coronary artery calcifications are present. Suspect calcification of the aortic leaflets as well. Atherosclerotic plaque within the normal caliber aorta. An shared  origin of the brachiocephalic and left common carotid artery. Minimal calcification in the proximal great vessels. Central pulmonary arteries are normal caliber. Mediastinum/Nodes: No mediastinal fluid or gas. Diminutive appearance of the right thyroid lobe, could reflect prior thyroid surgery. Thyroid gland and thoracic inlet are otherwise unremarkable. No acute abnormality of the trachea. Small sliding-type hiatal hernia. No worrisome mediastinal or axillary adenopathy. Hilar nodal evaluation is limited in the absence of intravenous contrast media. Lungs/Pleura: Coronal imaging demonstrates mild cephalad redistribution of the pulmonary vascularity with interlobular septal thickening towards the apices and lung bases. No focal consolidation, pneumothorax or effusion. Dependent areas of atelectasis are present with additional bandlike opacities in the right middle lobe and both lung bases compatible with scarring and or atelectasis. No concerning pulmonary nodules or masses. Musculoskeletal: No chest wall mass or suspicious bone lesions identified. Multilevel degenerative changes are present in the imaged portions of the spine. Minimal degenerative changes in bilateral glenohumeral joints. CT ABDOMEN PELVIS FINDINGS Hepatobiliary: Diffuse hepatic hypoattenuation compatible with hepatic steatosis. Focal fatty sparing along the gallbladder fossa. Normal gallbladder. No visible calcified gallstones. No biliary ductal dilatation. Pancreas: Unremarkable. No pancreatic ductal dilatation or surrounding inflammatory changes. Spleen: Small calcification in the splenic hilum could reflect a calcified granuloma or vascular calcification. No worrisome splenic lesion. Normal splenic size. 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 Additional mild nonspecific thickening of the left adrenal gland without  concerning dominant nodule. The kidneys are orthotopic. Bilateral fluid attenuation renal cysts are present, in the right kidney measures up to 3.8 cm, in the left measuring up to 2.4 cm. No concerning renal masses. There is bilateral perinephric stranding which is slightly asymmetrically increased on the left with mild pelviectasis and ureterectasis to the level of the urinary bladder. A punctate radiodensity is seen along the left posterolateral margin of the urinary bladder separate from the UV junction though could reflect a recently passed calculus. Additional nonobstructing calculus versus vascular calcifications seen in the lower pole left kidney as well. Stomach/Bowel: Small hiatal hernia, as above. Distal stomach and duodenum are unremarkable with normal course of the duodenum across the midline abdomen. No small bowel dilatation or wall thickening. A normal appendix is visualized. No colonic dilatation or wall thickening. Scattered colonic diverticula without focal pericolonic inflammation to suggest diverticulitis. No evidence of mechanical bowel obstruction. Vascular/Lymphatic: Atherosclerotic plaque within the normal caliber aorta. No pathologically enlarged abdominopelvic lymph nodes. Reproductive: Anteverted uterus with partially calcified anterior lobular probable fibroid measuring up to 6 cm in size. No concerning adnexal lesions. Other: No abdominopelvic free fluid or free gas. No bowel containing hernias. Small fat containing umbilical hernia. Musculoskeletal: Mild levocurvature of the lumbar spine, apex L4. Multilevel degenerative changes are present in the imaged portions of the spine. Additional degenerative changes at the SI joints, bilateral hips and  symphysis pubis. No acute osseous abnormality or suspicious osseous lesion. IMPRESSION: 1. Features of vascular congestion with early/mild interstitial edema. No other acute intrathoracic abnormality. 2. Punctate radiodensity in the left  posterolateral bladder, separate from the left UVJ. Could reflect a recently passed calculus with mild left pelviectasis and ureterectasis with some asymmetric left renal stranding. 3. Additional nonobstructing calculus versus vascular calcifications in the lower pole left kidney as well. 4. Hepatic steatosis. 5. 1.6 cm low-attenuation nodule in the right adrenal gland most compatible with benign adenoma. 6. Colonic diverticulosis without evidence of diverticulitis. 7. Fibroid uterus. 8. Small sliding-type hiatal hernia. 9. Hypoattenuation of the cardiac blood pool suggests anemia. 10. Aortic Atherosclerosis (ICD10-I70.0). Electronically Signed   By: Lovena Le M.D.   On: 05/06/2019 00:55   DG C-Arm 1-60 Min-No Report  Result Date: 05/06/2019 Fluoroscopy was utilized by the requesting physician.  No radiographic interpretation.    Subjective: Met bedside and she is doing well.  Denies nausea or vomiting.  Her main complaint was just sore mouth.  Felt well.  No other concerns or plans at this time and ambulated with physical therapy but feels a little out of shape now.  Denies any complaints.  Stable to be discharged from medical neurological perspective.   Discharge Exam: Vitals:   05/10/19 0506 05/10/19 1330  BP: (!) 147/78 (!) 146/66  Pulse: 90 93  Resp: 17 18  Temp: 98.4 F (36.9 C) 99 F (37.2 C)  SpO2: 97% 98%   Vitals:   05/09/19 1445 05/10/19 0251 05/10/19 0506 05/10/19 1330  BP: (!) 152/67 (!) 167/86 (!) 147/78 (!) 146/66  Pulse: 93 96 90 93  Resp: '19 18 17 18  '$ Temp: 98.7 F (37.1 C) 98.9 F (37.2 C) 98.4 F (36.9 C) 99 F (37.2 C)  TempSrc: Oral Oral  Oral  SpO2: 96% 96% 97% 98%  Weight:      Height:       General: Pt is alert, awake, not in acute distress Cardiovascular: Mildly tachycardic but regular rhythm, S1/S2 +, no rubs, no gallops Respiratory: Diminished bilaterally, no wheezing, no rhonchi; unlabored breathing Abdominal: Soft, NT, distended secondary body  habitus, bowel sounds + Extremities: Mild edema, no cyanosis  The results of significant diagnostics from this hospitalization (including imaging, microbiology, ancillary and laboratory) are listed below for reference.    Microbiology: Recent Results (from the past 240 hour(s))  Culture, blood (Routine x 2)     Status: Abnormal   Collection Time: 05/05/19  9:34 PM   Specimen: BLOOD  Result Value Ref Range Status   Specimen Description   Final    BLOOD RIGHT WRIST Performed at [facility] [address], [place], [place] [address]    Special Requests   Final    BOTTLES DRAWN AEROBIC AND ANAEROBIC Blood Culture results may not be optimal due to an excessive volume of blood received in culture bottles Performed at [facility] [address] [place], [place] [address]    Culture  Setup Time   Final    GRAM NEGATIVE RODS IN BOTH AEROBIC AND ANAEROBIC BOTTLES CRITICAL RESULT CALLED TO, READ BACK BY AND VERIFIED WITH: Otila Back [id number] AT 33 B Y CM Performed at [facility], [place] [address] [place], [place] [address]    Culture ESCHERICHIA COLI (A)  Final   Report Status 05/08/2019 FINAL  Final   Organism ID, Bacteria ESCHERICHIA COLI  Final      Susceptibility  Escherichia coli - MIC*    AMPICILLIN >=32 RESISTANT Resistant     CEFAZOLIN <=4 SENSITIVE Sensitive     CEFEPIME <=1 SENSITIVE Sensitive     CEFTAZIDIME <=1 SENSITIVE Sensitive     CEFTRIAXONE <=1 SENSITIVE Sensitive     CIPROFLOXACIN <=0.25 SENSITIVE Sensitive     GENTAMICIN <=1 SENSITIVE Sensitive     IMIPENEM <=0.25 SENSITIVE Sensitive     TRIMETH/SULFA <=20 SENSITIVE Sensitive     AMPICILLIN/SULBACTAM 16 INTERMEDIATE Intermediate     PIP/TAZO <=4 SENSITIVE Sensitive     * ESCHERICHIA COLI  Blood Culture ID Panel (Reflexed)     Status: Abnormal   Collection Time: 05/05/19  9:34 PM  Result Value Ref Range Status   Enterococcus species NOT DETECTED NOT DETECTED  Final   Listeria monocytogenes NOT DETECTED NOT DETECTED Final   Staphylococcus species NOT DETECTED NOT DETECTED Final   Staphylococcus aureus (BCID) NOT DETECTED NOT DETECTED Final   Streptococcus species NOT DETECTED NOT DETECTED Final   Streptococcus agalactiae NOT DETECTED NOT DETECTED Final   Streptococcus pneumoniae NOT DETECTED NOT DETECTED Final   Streptococcus pyogenes NOT DETECTED NOT DETECTED Final   Acinetobacter baumannii NOT DETECTED NOT DETECTED Final   Enterobacteriaceae species DETECTED (A) NOT DETECTED Final    Comment: Enterobacteriaceae represent a large family of gram-negative bacteria, not a single organism. CRITICAL RESULT CALLED TO, READ BACK BY AND VERIFIED WITH: PHARMD J LEGGE [id number] AT 1545 BY CM    Enterobacter cloacae complex NOT DETECTED NOT DETECTED Final   Escherichia coli DETECTED (A) NOT DETECTED Final    Comment: CRITICAL RESULT CALLED TO, READ BACK BY AND VERIFIED WITH: PHARMD J LEGGE [phone] AT 1545 BY CM    Klebsiella oxytoca NOT DETECTED NOT DETECTED Final   Klebsiella pneumoniae NOT DETECTED NOT DETECTED Final   Proteus species NOT DETECTED NOT DETECTED Final   Serratia marcescens NOT DETECTED NOT DETECTED Final   Carbapenem resistance NOT DETECTED NOT DETECTED Final   Haemophilus influenzae NOT DETECTED NOT DETECTED Final   Neisseria meningitidis NOT DETECTED NOT DETECTED Final   Pseudomonas aeruginosa NOT DETECTED NOT DETECTED Final   Candida albicans NOT DETECTED NOT DETECTED Final   Candida glabrata NOT DETECTED NOT DETECTED Final   Candida krusei NOT DETECTED NOT DETECTED Final   Candida parapsilosis NOT DETECTED NOT DETECTED Final   Candida tropicalis NOT DETECTED NOT DETECTED Final    Comment: Performed at [facility], [place]. [address], [place], [place] [address]  Culture, blood (Routine x 2)     Status: Abnormal   Collection Time: 05/05/19  9:39 PM   Specimen: BLOOD  Result Value Ref Range Status   Specimen Description   Final     BLOOD RIGHT HAND Performed at [facility] [address] [place], [place] [address]    Special Requests   Final    BOTTLES DRAWN AEROBIC AND ANAEROBIC Blood Culture adequate volume Performed at [facility] [address], [place], [place] [address]    Culture  Setup Time   Final    GRAM NEGATIVE RODS AEROBIC BOTTLE ONLY CRITICAL VALUE NOTED.  VALUE IS CONSISTENT WITH PREVIOUSLY REPORTED AND CALLED VALUE.    Culture (A)  Final    ESCHERICHIA COLI SUSCEPTIBILITIES PERFORMED ON PREVIOUS CULTURE WITHIN THE LAST 5 DAYS. Performed at [facility], [place] [address], [place],  [address]    Report Status 05/08/2019 FINAL  Final  Urine culture  Status: Abnormal   Collection Time: 05/05/19  9:40 PM   Specimen: In/Out Cath Urine  Result Value Ref Range Status   Specimen Description   Final    IN/OUT CATH URINE Performed at [facility], [place] [address], [place], [place] [address]    Special Requests   Final    NONE Performed at [facility], [place] [address] [place], [place] [address]    Culture 40,000 COLONIES/mL ESCHERICHIA COLI (A)  Final   Report Status 05/08/2019 FINAL  Final   Organism ID, Bacteria ESCHERICHIA COLI (A)  Final      Susceptibility   Escherichia coli - MIC*    AMPICILLIN >=32 RESISTANT Resistant     CEFAZOLIN <=4 SENSITIVE Sensitive     CEFTRIAXONE <=1 SENSITIVE Sensitive     CIPROFLOXACIN <=0.25 SENSITIVE Sensitive     GENTAMICIN <=1 SENSITIVE Sensitive     IMIPENEM <=0.25 SENSITIVE Sensitive     NITROFURANTOIN <=16 SENSITIVE Sensitive     TRIMETH/SULFA <=20 SENSITIVE Sensitive     AMPICILLIN/SULBACTAM 16 INTERMEDIATE Intermediate     PIP/TAZO <=4 SENSITIVE Sensitive     * 40,000 COLONIES/mL ESCHERICHIA COLI  Respiratory Panel by RT PCR (Flu A&B, Covid) - Nasopharyngeal Swab     Status: None   Collection Time: 05/05/19  9:41 PM   Specimen: Nasopharyngeal Swab   Result Value Ref Range Status   SARS Coronavirus 2 by RT PCR NEGATIVE NEGATIVE Final    Comment: (NOTE) SARS-CoV-2 target nucleic acids are NOT DETECTED. The SARS-CoV-2 RNA is generally detectable in upper respiratoy specimens during the acute phase of infection. The lowest concentration of SARS-CoV-2 viral copies this assay can detect is 131 copies/mL. A negative result does not preclude SARS-Cov-2 infection and should not be used as the sole basis for treatment or other patient management decisions. A negative result may occur with  improper specimen collection/handling, submission of specimen other than nasopharyngeal swab, presence of viral mutation(s) within the areas targeted by this assay, and inadequate number of viral copies (<131 copies/mL). A negative result must be combined with clinical observations, patient history, and epidemiological information. The expected result is Negative. Fact Sheet for Patients:  PinkCheek.be Fact Sheet for Healthcare Providers:  GravelBags.it This test is not yet ap proved or cleared by the [place] FDA and  has been authorized for detection and/or diagnosis of SARS-CoV-2 by FDA under an Emergency Use Authorization (EUA). This EUA will remain  in effect (meaning this test can be used) for the duration of the COVID-19 declaration under Section 564(b)(1) of the Act, 21 U.S.C. section 360bbb-3(b)(1), unless the authorization is terminated or revoked sooner.    Influenza A by PCR NEGATIVE NEGATIVE Final   Influenza B by PCR NEGATIVE NEGATIVE Final    Comment: (NOTE) The Xpert Xpress SARS-CoV-2/FLU/RSV assay is intended as an aid in  the diagnosis of influenza from Nasopharyngeal swab specimens and  should not be used as a sole basis for treatment. Nasal washings and  aspirates are unacceptable for Xpert Xpress SARS-CoV-2/FLU/RSV  testing. Fact Sheet for  Patients: PinkCheek.be Fact Sheet for Healthcare Providers: GravelBags.it This test is not yet approved or cleared by the [place] FDA and  has been authorized for detection and/or diagnosis of SARS-CoV-2 by  FDA under an Emergency Use Authorization (EUA). This EUA will remain  in effect (meaning this test can be used) for the duration of the  Covid-19 declaration under Section 564(b)(1) of the Act, 21  U.S.C.  section 360bbb-3(b)(1), unless the authorization is  terminated or revoked. Performed at [facility], [place] [address] [place], [place] [address]   Urine Culture     Status: None   Collection Time: 05/06/19  4:06 AM   Specimen: Urine, Cystoscope  Result Value Ref Range Status   Specimen Description   Final    URINE, RANDOM Performed at [facility] [address], [place], [place] [address]    Special Requests   Final    NONE Performed at [facility], [place] [address] [place], [place] [address]    Culture   Final    NO GROWTH Performed at [facility], [place] [address] [place], [place] [address]    Report Status 05/07/2019 FINAL  Final  MRSA PCR Screening     Status: None   Collection Time: 05/06/19 11:36 AM   Specimen: Nasal Mucosa; Nasopharyngeal  Result Value Ref Range Status   MRSA by PCR NEGATIVE NEGATIVE Final    Comment:        The GeneXpert MRSA Assay (FDA approved for NASAL specimens only), is one component of a comprehensive MRSA colonization surveillance program. It is not intended to diagnose MRSA infection nor to guide or monitor treatment for MRSA infections. Performed at [facility], [place] [address], [place], [place] [address]     Labs: BNP (last 3 results) No results for input(s): BNP in the last 8760 hours. Basic Metabolic Panel: Recent Labs  Lab 05/06/19 0742 05/07/19 0343 05/08/19 0817  05/09/19 0325 05/10/19 0330  NA 142 142 141 143 140  K 4.0 4.1 4.0 3.9 3.6  CL 112* 112* 111 108 104  CO2 22 21* '22 25 24  '$ GLUCOSE 167* 117* 123* 123* 135*  BUN 41* 34* '23 20 16  '$ CREATININE 2.67* 1.68* 1.29* 1.07* 1.09*  CALCIUM 8.3* 8.4* 8.8* 9.1 9.3  MG 1.4* 2.3 1.9 1.8 1.7  PHOS  --  2.6  --   --  3.7   Liver Function Tests: Recent Labs  Lab 05/06/19 0742 05/07/19 0343 05/08/19 0817 05/09/19 0325 05/10/19 0330  AST 95* 46* 30 35 33  ALT 60* 49* 41 42 36  ALKPHOS 53 55 65 80 73  BILITOT 0.6 0.7 0.5 0.8 0.6  PROT 5.8* 5.8* 6.1* 5.9* 6.0*  ALBUMIN 3.2* 2.9* 3.0* 3.0* 3.0*   No results for input(s): LIPASE, AMYLASE in the last 168 hours. No results for input(s): AMMONIA in the last 168 hours. CBC: Recent Labs  Lab 05/06/19 0742 05/07/19 0343 05/08/19 0817 05/09/19 0325 05/10/19 0330  WBC 16.9* 20.9* 18.5* 11.3* 7.4  NEUTROABS 15.1* 16.4* 14.6* 7.3 3.4  HGB 8.9* 8.6* 8.7* 9.1* 9.2*  HCT 28.7* 27.9* 27.4* 28.4* 29.0*  MCV 90.8 90.3 90.1 88.2 89.2  PLT 239 209 210 253 283   Cardiac Enzymes: No results for input(s): CKTOTAL, CKMB, CKMBINDEX, TROPONINI in the last 168 hours. BNP: Invalid input(s): POCBNP CBG: Recent Labs  Lab 05/09/19 2007 05/10/19 0605 05/10/19 0816 05/10/19 1226 05/10/19 1606  GLUCAP 112* 143* 119* 137* 100*   D-Dimer No results for input(s): DDIMER in the last 72 hours. Hgb A1c No results for input(s): HGBA1C in the last 72 hours. Lipid Profile No results for input(s): CHOL, HDL, LDLCALC, TRIG, CHOLHDL, LDLDIRECT in the last 72 hours. Thyroid function studies No results for input(s): TSH, T4TOTAL, T3FREE, THYROIDAB in the last 72 hours.  Invalid input(s): FREET3 Anemia work up No results for input(s):  VITAMINB12, FOLATE, FERRITIN, TIBC, IRON, RETICCTPCT in the last 72 hours. Urinalysis    Component Value Date/Time   COLORURINE YELLOW 05/05/2019 2134   APPEARANCEUR CLOUDY (A) 05/05/2019 2134   LABSPEC 1.015 05/05/2019 2134    PHURINE 5.0 05/05/2019 2134   GLUCOSEU NEGATIVE 05/05/2019 2134   HGBUR LARGE (A) 05/05/2019 2134   BILIRUBINUR NEGATIVE 05/05/2019 2134   KETONESUR 5 (A) 05/05/2019 2134   PROTEINUR 100 (A) 05/05/2019 2134   NITRITE NEGATIVE 05/05/2019 2134   LEUKOCYTESUR MODERATE (A) 05/05/2019 2134   Sepsis Labs Invalid input(s): PROCALCITONIN,  WBC,  LACTICIDVEN Microbiology Recent Results (from the past 240 hour(s))  Culture, blood (Routine x 2)     Status: Abnormal   Collection Time: 05/05/19  9:34 PM   Specimen: BLOOD  Result Value Ref Range Status   Specimen Description   Final    BLOOD RIGHT WRIST Performed at [facility], [place] [address] [place], [place] [address]    Special Requests   Final    BOTTLES DRAWN AEROBIC AND ANAEROBIC Blood Culture results may not be optimal due to an excessive volume of blood received in culture bottles Performed at [facility] [address], [place], [place] [address]    Culture  Setup Time   Final    GRAM NEGATIVE RODS IN BOTH AEROBIC AND ANAEROBIC BOTTLES CRITICAL RESULT CALLED TO, READ BACK BY AND VERIFIED WITH: Otila Back [id number] AT 32 B Y CM Performed at [facility], [place] [address], [place], [place] [address]    Culture ESCHERICHIA COLI (A)  Final   Report Status 05/08/2019 FINAL  Final   Organism ID, Bacteria ESCHERICHIA COLI  Final      Susceptibility   Escherichia coli - MIC*    AMPICILLIN >=32 RESISTANT Resistant     CEFAZOLIN <=4 SENSITIVE Sensitive     CEFEPIME <=1 SENSITIVE Sensitive     CEFTAZIDIME <=1 SENSITIVE Sensitive     CEFTRIAXONE <=1 SENSITIVE Sensitive     CIPROFLOXACIN <=0.25 SENSITIVE Sensitive     GENTAMICIN <=1 SENSITIVE Sensitive     IMIPENEM <=0.25 SENSITIVE Sensitive     TRIMETH/SULFA <=20 SENSITIVE Sensitive     AMPICILLIN/SULBACTAM 16 INTERMEDIATE Intermediate     PIP/TAZO <=4 SENSITIVE Sensitive     * ESCHERICHIA COLI  Blood Culture ID Panel (Reflexed)      Status: Abnormal   Collection Time: 05/05/19  9:34 PM  Result Value Ref Range Status   Enterococcus species NOT DETECTED NOT DETECTED Final   Listeria monocytogenes NOT DETECTED NOT DETECTED Final   Staphylococcus species NOT DETECTED NOT DETECTED Final   Staphylococcus aureus (BCID) NOT DETECTED NOT DETECTED Final   Streptococcus species NOT DETECTED NOT DETECTED Final   Streptococcus agalactiae NOT DETECTED NOT DETECTED Final   Streptococcus pneumoniae NOT DETECTED NOT DETECTED Final   Streptococcus pyogenes NOT DETECTED NOT DETECTED Final   Acinetobacter baumannii NOT DETECTED NOT DETECTED Final   Enterobacteriaceae species DETECTED (A) NOT DETECTED Final    Comment: Enterobacteriaceae represent a large family of gram-negative bacteria, not a single organism. CRITICAL RESULT CALLED TO, READ BACK BY AND VERIFIED WITH: PHARMD J LEGGE [phone] AT 1545 BY CM    Enterobacter cloacae complex NOT DETECTED NOT DETECTED Final   Escherichia coli DETECTED (A) NOT DETECTED Final    Comment: CRITICAL RESULT CALLED TO, READ BACK BY AND VERIFIED WITH: PHARMD J LEGGE [id number] AT 1545 BY CM    Klebsiella oxytoca NOT DETECTED NOT  DETECTED Final   Klebsiella pneumoniae NOT DETECTED NOT DETECTED Final   Proteus species NOT DETECTED NOT DETECTED Final   Serratia marcescens NOT DETECTED NOT DETECTED Final   Carbapenem resistance NOT DETECTED NOT DETECTED Final   Haemophilus influenzae NOT DETECTED NOT DETECTED Final   Neisseria meningitidis NOT DETECTED NOT DETECTED Final   Pseudomonas aeruginosa NOT DETECTED NOT DETECTED Final   Candida albicans NOT DETECTED NOT DETECTED Final   Candida glabrata NOT DETECTED NOT DETECTED Final   Candida krusei NOT DETECTED NOT DETECTED Final   Candida parapsilosis NOT DETECTED NOT DETECTED Final   Candida tropicalis NOT DETECTED NOT DETECTED Final    Comment: Performed at [facility], [place] [address], [place], [place] [address]  Culture, blood (Routine x 2)      Status: Abnormal   Collection Time: 05/05/19  9:39 PM   Specimen: BLOOD  Result Value Ref Range Status   Specimen Description   Final    BLOOD RIGHT HAND Performed at [facility] [address], [place], [place] [address]    Special Requests   Final    BOTTLES DRAWN AEROBIC AND ANAEROBIC Blood Culture adequate volume Performed at [facility] [address], [place], [place] [address]    Culture  Setup Time   Final    GRAM NEGATIVE RODS AEROBIC BOTTLE ONLY CRITICAL VALUE NOTED.  VALUE IS CONSISTENT WITH PREVIOUSLY REPORTED AND CALLED VALUE.    Culture (A)  Final    ESCHERICHIA COLI SUSCEPTIBILITIES PERFORMED ON PREVIOUS CULTURE WITHIN THE LAST 5 DAYS. Performed at [facility], [place] [address] [place], [place] [address]    Report Status 05/08/2019 FINAL  Final  Urine culture     Status: Abnormal   Collection Time: 05/05/19  9:40 PM   Specimen: In/Out Cath Urine  Result Value Ref Range Status   Specimen Description   Final    IN/OUT CATH URINE Performed at [facility] [address] [place], [place] [address]    Special Requests   Final    NONE Performed at [facility], [place] [address], [place], [place] [address]    Culture 40,000 COLONIES/mL ESCHERICHIA COLI (A)  Final   Report Status 05/08/2019 FINAL  Final   Organism ID, Bacteria ESCHERICHIA COLI (A)  Final      Susceptibility   Escherichia coli - MIC*    AMPICILLIN >=32 RESISTANT Resistant     CEFAZOLIN <=4 SENSITIVE Sensitive     CEFTRIAXONE <=1 SENSITIVE Sensitive     CIPROFLOXACIN <=0.25 SENSITIVE Sensitive     GENTAMICIN <=1 SENSITIVE Sensitive     IMIPENEM <=0.25 SENSITIVE Sensitive     NITROFURANTOIN <=16 SENSITIVE Sensitive     TRIMETH/SULFA <=20 SENSITIVE Sensitive     AMPICILLIN/SULBACTAM 16 INTERMEDIATE Intermediate     PIP/TAZO <=4 SENSITIVE Sensitive     * 40,000 COLONIES/mL ESCHERICHIA COLI  Respiratory Panel by  RT PCR (Flu A&B, Covid) - Nasopharyngeal Swab     Status: None   Collection Time: 05/05/19  9:41 PM   Specimen: Nasopharyngeal Swab  Result Value Ref Range Status   SARS Coronavirus 2 by RT PCR NEGATIVE NEGATIVE Final    Comment: (NOTE) SARS-CoV-2 target nucleic acids are NOT DETECTED. The SARS-CoV-2 RNA is generally detectable in upper respiratoy specimens during the acute phase of infection. The lowest concentration of SARS-CoV-2 viral copies this assay can detect is 131 copies/mL. A negative result does not preclude SARS-Cov-2  infection and should not be used as the sole basis for treatment or other patient management decisions. A negative result may occur with  improper specimen collection/handling, submission of specimen other than nasopharyngeal swab, presence of viral mutation(s) within the areas targeted by this assay, and inadequate number of viral copies (<131 copies/mL). A negative result must be combined with clinical observations, patient history, and epidemiological information. The expected result is Negative. Fact Sheet for Patients:  PinkCheek.be Fact Sheet for Healthcare Providers:  GravelBags.it This test is not yet ap proved or cleared by the [place] FDA and  has been authorized for detection and/or diagnosis of SARS-CoV-2 by FDA under an Emergency Use Authorization (EUA). This EUA will remain  in effect (meaning this test can be used) for the duration of the COVID-19 declaration under Section 564(b)(1) of the Act, 21 U.S.C. section 360bbb-3(b)(1), unless the authorization is terminated or revoked sooner.    Influenza A by PCR NEGATIVE NEGATIVE Final   Influenza B by PCR NEGATIVE NEGATIVE Final    Comment: (NOTE) The Xpert Xpress SARS-CoV-2/FLU/RSV assay is intended as an aid in  the diagnosis of influenza from Nasopharyngeal swab specimens and  should not be used as a sole basis for treatment.  Nasal washings and  aspirates are unacceptable for Xpert Xpress SARS-CoV-2/FLU/RSV  testing. Fact Sheet for Patients: PinkCheek.be Fact Sheet for Healthcare Providers: GravelBags.it This test is not yet approved or cleared by the [place] FDA and  has been authorized for detection and/or diagnosis of SARS-CoV-2 by  FDA under an Emergency Use Authorization (EUA). This EUA will remain  in effect (meaning this test can be used) for the duration of the  Covid-19 declaration under Section 564(b)(1) of the Act, 21  U.S.C. section 360bbb-3(b)(1), unless the authorization is  terminated or revoked. Performed at [facility], [place] [address], [place], [place] [address]   Urine Culture     Status: None   Collection Time: 05/06/19  4:06 AM   Specimen: Urine, Cystoscope  Result Value Ref Range Status   Specimen Description   Final    URINE, RANDOM Performed at [facility] [address] [place], [place] [address]    Special Requests   Final    NONE Performed at [facility], [place] [address] [place], [place] [address]    Culture   Final    NO GROWTH Performed at [facility], [place] [address], [place], [place] [address]    Report Status 05/07/2019 FINAL  Final  MRSA PCR Screening     Status: None   Collection Time: 05/06/19 11:36 AM   Specimen: Nasal Mucosa; Nasopharyngeal  Result Value Ref Range Status   MRSA by PCR NEGATIVE NEGATIVE Final    Comment:        The GeneXpert MRSA Assay (FDA approved for NASAL specimens only), is one component of a comprehensive MRSA colonization surveillance program. It is not intended to diagnose MRSA infection nor to guide or monitor treatment for MRSA infections. Performed at [facility], [place] [address], [place], [place] [address]    Time coordinating discharge:  35 minutes  SIGNED:  Kerney Elbe, DO [facility] 05/10/2019, 9:00 PM Pager is on AMION  If 7PM-7AM, please contact night-coverage [email or web address] Usual Password

## 2020-03-05 ENCOUNTER — Other Ambulatory Visit: Payer: Self-pay | Admitting: Internal Medicine

## 2020-03-05 ENCOUNTER — Ambulatory Visit
Admission: RE | Admit: 2020-03-05 | Discharge: 2020-03-05 | Disposition: A | Discharge status: Home / Self Care | Payer: Federal, State, Local not specified - PPO | Source: Ambulatory Visit | Attending: Internal Medicine | Admitting: Internal Medicine

## 2020-03-05 DIAGNOSIS — R0602 Shortness of breath: Secondary | ICD-10-CM

## 2020-03-19 ENCOUNTER — Emergency Department (HOSPITAL_COMMUNITY)
Admission: EM | Admit: 2020-03-19 | Discharge: 2020-03-19 | Disposition: A | Discharge status: Home / Self Care | Payer: Federal, State, Local not specified - PPO | Attending: Emergency Medicine | Admitting: Emergency Medicine

## 2020-03-19 ENCOUNTER — Encounter (HOSPITAL_COMMUNITY): Payer: Self-pay

## 2020-03-19 ENCOUNTER — Other Ambulatory Visit: Payer: Self-pay

## 2020-03-19 DIAGNOSIS — R5383 Other fatigue: Secondary | ICD-10-CM | POA: Diagnosis present

## 2020-03-19 DIAGNOSIS — Z79899 Other long term (current) drug therapy: Secondary | ICD-10-CM | POA: Insufficient documentation

## 2020-03-19 DIAGNOSIS — Z955 Presence of coronary angioplasty implant and graft: Secondary | ICD-10-CM | POA: Diagnosis not present

## 2020-03-19 DIAGNOSIS — E119 Type 2 diabetes mellitus without complications: Secondary | ICD-10-CM | POA: Insufficient documentation

## 2020-03-19 DIAGNOSIS — R000 Tachycardia, unspecified: Secondary | ICD-10-CM | POA: Insufficient documentation

## 2020-03-19 DIAGNOSIS — I10 Essential (primary) hypertension: Secondary | ICD-10-CM | POA: Diagnosis not present

## 2020-03-19 DIAGNOSIS — N390 Urinary tract infection, site not specified: Secondary | ICD-10-CM | POA: Insufficient documentation

## 2020-03-19 LAB — CBC WITH DIFFERENTIAL/PLATELET
Abs Immature Granulocytes: 0.03 10*3/uL (ref 0.00–0.07)
Basophils Absolute: 0 10*3/uL (ref 0.0–0.1)
Basophils Relative: 0 %
Eosinophils Absolute: 0.1 10*3/uL (ref 0.0–0.5)
Eosinophils Relative: 2 %
HCT: 35.9 % — ABNORMAL LOW (ref 36.0–46.0)
Hemoglobin: 11.6 g/dL — ABNORMAL LOW (ref 12.0–15.0)
Immature Granulocytes: 0 %
Lymphocytes Relative: 25 %
Lymphs Abs: 2 10*3/uL (ref 0.7–4.0)
MCH: 27.8 pg (ref 26.0–34.0)
MCHC: 32.3 g/dL (ref 30.0–36.0)
MCV: 86.1 fL (ref 80.0–100.0)
Monocytes Absolute: 0.8 10*3/uL (ref 0.1–1.0)
Monocytes Relative: 10 %
Neutro Abs: 4.9 10*3/uL (ref 1.7–7.7)
Neutrophils Relative %: 63 %
Platelets: 387 10*3/uL (ref 150–400)
RBC: 4.17 MIL/uL (ref 3.87–5.11)
RDW: 15.2 % (ref 11.5–15.5)
WBC: 7.8 10*3/uL (ref 4.0–10.5)
nRBC: 0 % (ref 0.0–0.2)

## 2020-03-19 LAB — URINALYSIS, ROUTINE W REFLEX MICROSCOPIC
Bilirubin Urine: NEGATIVE
Glucose, UA: NEGATIVE mg/dL
Ketones, ur: NEGATIVE mg/dL
Nitrite: POSITIVE — AB
Protein, ur: NEGATIVE mg/dL
Specific Gravity, Urine: 1.012 (ref 1.005–1.030)
pH: 5 (ref 5.0–8.0)

## 2020-03-19 LAB — BASIC METABOLIC PANEL
Anion gap: 13 (ref 5–15)
BUN: 23 mg/dL (ref 8–23)
CO2: 22 mmol/L (ref 22–32)
Calcium: 10.4 mg/dL — ABNORMAL HIGH (ref 8.9–10.3)
Chloride: 105 mmol/L (ref 98–111)
Creatinine, Ser: 1.02 mg/dL — ABNORMAL HIGH (ref 0.44–1.00)
GFR, Estimated: 57 mL/min — ABNORMAL LOW (ref >60)
Glucose, Bld: 131 mg/dL — ABNORMAL HIGH (ref 70–99)
Potassium: 3.8 mmol/L (ref 3.5–5.1)
Sodium: 140 mmol/L (ref 135–145)

## 2020-03-19 LAB — TSH: TSH: 2.278 u[IU]/mL (ref 0.350–4.500)

## 2020-03-19 MED ORDER — CEPHALEXIN 500 MG PO CAPS: 500.0000 mg | ORAL_CAPSULE | Freq: Four times a day (QID) | ORAL | 0 refills | Status: DC

## 2020-03-19 MED ORDER — CEPHALEXIN 500 MG PO CAPS
500.0000 mg | ORAL_CAPSULE | Freq: Once | ORAL | Status: AC
  Administered 2020-03-19: 500 mg via ORAL
  Filled 2020-03-19: qty 1

## 2020-03-19 MED ORDER — SODIUM CHLORIDE 0.9 % IV BOLUS
500.0000 mL | Freq: Once | INTRAVENOUS | Status: AC
  Administered 2020-03-19: 500 mL via INTRAVENOUS

## 2020-03-19 NOTE — ED Provider Notes (Signed)
[facility] DEPT Provider Note   CSN: [id number] Arrival date & time: 03/19/20  1639     History Chief Complaint  Patient presents with  . Tachycardia  . Hypertension    Diane Wilkerson is a 80 y.o. female.  HPI She presents for evaluation of tachycardia and fatigue.  She saw her PCP who changed her blood pressure medicine, she has not had much relief yet.  She denies fever, chills, anorexia, nausea, vomiting, chest pain, headache, neck pain or back pain.  She ambulates easily.  She came here by private vehicle.  There are no other known modifying factors.    Past Medical History:  Diagnosis Date  . Essential hypertension 05/06/2019  . Pulmonary sarcoidosis ([place])   . Uncontrolled type 2 diabetes mellitus with hyperglycemia, without long-term current use of insulin ([place]) 05/06/2019    Patient Active Problem List   Diagnosis Date Noted  . E coli bacteremia 05/07/2019  . Complicated UTI (urinary tract infection) 05/07/2019  . Left ureteral stone 05/07/2019  . Hydronephrosis, left 05/07/2019  . Folate deficiency 05/07/2019  . Iron deficiency anemia 05/07/2019  . Essential hypertension 05/06/2019  . Uncontrolled type 2 diabetes mellitus with hyperglycemia, without long-term current use of insulin ([place]) 05/06/2019  . AKI (acute kidney injury) ([place]) 05/06/2019  . Lactic acidosis 05/06/2019  . Hypokalemia, inadequate intake 05/06/2019  . 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 Severe sepsis with acute organ dysfunction due to gram-negative bacteria ([place]) 05/06/2019  . Acute pyelonephritis 05/06/2019    Past Surgical History:  Procedure Laterality Date  . CYSTOSCOPY WITH STENT PLACEMENT Left 05/06/2019   Procedure: Cystoscopy with retrograde pyleogram and left stent placement, basket removal of stone foley placement;  Surgeon: Festus Aloe, MD;  Location: [facility] ORS;  Service: Urology;  Laterality: Left;     OB History   No obstetric history on file.      Family History  Problem Relation Age of Onset  . Pancreatic cancer Mother   . Heart disease Father     Social History   Tobacco Use  . Smoking status: Never Smoker  . Smokeless tobacco: Never Used  Substance Use Topics  . Alcohol use: Not Currently  . Drug use: Never    Home Medications Prior to Admission medications   Medication Sig Start Date End Date Taking? Authorizing Provider  acetaminophen (TYLENOL) 500 MG tablet Take 500 mg by mouth every 6 (six) hours as needed.   Yes [provider]  amLODipine (NORVASC) 5 MG tablet Take 5 mg by mouth daily. 03/05/20  Yes [provider]  cephALEXin (KEFLEX) 500 MG capsule Take 1 capsule (500 mg total) by mouth 4 (four) times daily. 03/19/20  Yes Daleen Bo, MD  cholecalciferol (VITAMIN D3) 25 MCG (1000 UNIT) tablet Take 1,000 Units by mouth daily.   Yes [provider]  Coenzyme Q10 (CO Q 10 PO) Take 1 tablet by mouth daily.   Yes [provider]  Cyanocobalamin (VITAMIN B 12 PO) Take 1 tablet by mouth daily.   Yes [provider]  glipizide-metformin (METAGLIP) 2.5-250 MG tablet Take 1 tablet by mouth 2 (two) times daily. 04/21/19  Yes [provider]  benzocaine (ORAJEL) 10 % mucosal gel Use as directed in the mouth or throat 2 (two) times daily as needed for mouth pain. Patient not taking: No sig reported 05/10/19   Raiford Noble Latif, DO  folic acid (FOLVITE) 1 MG tablet Take 1 tablet (1 mg total) by mouth  daily. Patient not taking: No sig reported 05/10/19   Raiford Noble Latif, DO  losartan-hydrochlorothiazide (HYZAAR) 100-25 MG tablet Take 1 tablet by mouth daily. Patient not taking: No sig reported 03/26/19   [provider]  naphazoline-glycerin (CLEAR EYES REDNESS) 0.012-0.2 % SOLN Place 2 drops into both eyes 3 (three) times daily. Patient not taking: No sig reported 05/10/19   Raiford Noble Latif, DO  ondansetron (ZOFRAN) 4 MG tablet Take 1 tablet (4 mg total) by  mouth every 6 (six) hours as needed for nausea. Patient not taking: No sig reported 05/10/19   Raiford Noble Latif, DO  polyethylene glycol (MIRALAX / GLYCOLAX) 17 g packet Take 17 g by mouth daily as needed for mild constipation. Patient not taking: No sig reported 05/10/19   Kerney Elbe, DO    Allergies    Patient has no known allergies.  Review of Systems   Review of Systems  All other systems reviewed and are negative.   Physical Exam Updated Vital Signs BP (!) 151/76   Pulse 87   Temp 98.2 F (36.8 C) (Oral)   Resp 18   Ht 5\' 2"  (1.575 m)   Wt 90.7 kg   LMP  (LMP Unknown) Comment: postmenopausal  SpO2 99%   BMI 36.58 kg/m   Physical Exam Vitals and nursing note reviewed.  Constitutional:      General: She is not in acute distress.    Appearance: She is well-developed. She is obese. She is not ill-appearing, toxic-appearing or diaphoretic.  HENT:     Head: Normocephalic and atraumatic.     Right Ear: External ear normal.     Left Ear: External ear normal.  Eyes:     Conjunctiva/sclera: Conjunctivae normal.     Pupils: Pupils are equal, round, and reactive to light.  Neck:     Trachea: Phonation normal.  Cardiovascular:     Rate and Rhythm: Normal rate and regular rhythm.     Heart sounds: Normal heart sounds.  Pulmonary:     Effort: Pulmonary effort is normal.     Breath sounds: Normal breath sounds.  Abdominal:     General: There is no distension.     Palpations: Abdomen is soft.     Tenderness: There is no abdominal tenderness.  Musculoskeletal:        General: Normal range of motion.     Cervical back: Normal range of motion and neck supple.  Skin:    General: Skin is warm and dry.  Neurological:     Mental Status: She is alert and oriented to person, place, and time.     Cranial Nerves: No cranial nerve deficit.     Sensory: No sensory deficit.     Motor: No abnormal muscle tone.     Coordination: Coordination normal.  Psychiatric:         Mood and Affect: Mood normal.        Behavior: Behavior normal.        Thought Content: Thought content normal.        Judgment: Judgment normal.     ED Results / Procedures / Treatments   Labs (all labs ordered are listed, but only abnormal results are displayed) Labs Reviewed  CBC WITH DIFFERENTIAL/PLATELET - Abnormal; Notable for the following components:      Result Value   Hemoglobin 11.6 (*)    HCT 35.9 (*)    All other components within normal limits  BASIC METABOLIC PANEL - Abnormal; Notable  for the following components:   Glucose, Bld 131 (*)    Creatinine, Ser 1.02 (*)    Calcium 10.4 (*)    GFR, Estimated 57 (*)    All other components within normal limits  URINALYSIS, ROUTINE W REFLEX MICROSCOPIC - Abnormal; Notable for the following components:   Hgb urine dipstick SMALL (*)    Nitrite POSITIVE (*)    Leukocytes,Ua TRACE (*)    Bacteria, UA FEW (*)    All other components within normal limits  TSH    EKG EKG Interpretation  Date/Time:  Wednesday March 19 2020 16:59:05 EDT Ventricular Rate:  108 PR Interval:    QRS Duration: 75 QT Interval:  325 QTC Calculation: 436 R Axis:   36 Text Interpretation: Sinus tachycardia Baseline wander in lead(s) III V1 V2 since last tracing no significant change Confirmed by Daleen Bo [phone]) on 03/19/2020 5:45:27 PM   Radiology No results found.  Procedures Procedures   Medications Ordered in ED Medications  cephALEXin (KEFLEX) capsule 500 mg (has no administration in time range)  sodium chloride 0.9 % bolus 500 mL (0 mLs Intravenous Stopped 03/19/20 2041)    ED Course  I have reviewed the triage vital signs and the nursing notes.  Pertinent labs & imaging results that were available during my care of the patient were reviewed by me and considered in my medical decision making (see chart for details).    MDM Rules/Calculators/A&P                           Patient Vitals for the past 24 hrs:  BP Temp  Temp src Pulse Resp SpO2 Height Weight  03/19/20 2000 (!) 151/76 -- -- 87 18 99 % -- --  03/19/20 1930 (!) 154/82 -- -- 82 18 100 % -- --  03/19/20 1900 (!) 147/72 -- -- 81 18 100 % -- --  03/19/20 1845 -- -- -- 86 17 100 % -- --  03/19/20 1830 (!) 148/73 -- -- 81 16 99 % -- --  03/19/20 1815 (!) 164/76 -- -- 97 18 100 % -- --  03/19/20 1800 (!) 149/81 -- -- 95 13 100 % -- --  03/19/20 1745 (!) 177/86 -- -- (!) 110 15 100 % -- --  03/19/20 1649 -- -- -- -- -- -- 5\' 2"  (1.575 m) 90.7 kg  03/19/20 1646 (!) 186/83 98.2 F (36.8 C) Oral (!) 129 16 99 % -- --    8:41 PM Reevaluation with update and discussion. After initial assessment and treatment, an updated evaluation reveals she appears comfortable has reassuring vitals.  Findings discussed with patient, and husband, all questions answered. Daleen Bo   Medical Decision Making:  This patient is presenting for evaluation of fatigue and rapid heartbeat, which does require a range of treatment options, and is a complaint that involves a moderate risk of morbidity and mortality. The differential diagnoses include manipulation, hormone disorder, metabolic disorder, occult infection. I decided to review old records, and in summary [place] female presenting with ongoing fatigue and tachycardia despite evaluation and treatment by PCP, about 2 weeks ago.  I did not require additional historical information from anyone.  Clinical Laboratory Tests Ordered, included CBC, Metabolic panel, Urinalysis and TSH. Review indicates normal except abnormal urinalysis, with possible UTI, glucose high, creatinine high, calcium high, hemoglobin.    Critical Interventions-clinical evaluation, laboratory testing, treatment with Keflex, observation reassess  After These Interventions, the Patient was reevaluated  and was found with possible urinary tract infection.  Patient presenting with fatigue and tachycardia with abnormal urinalysis, but no distinct urinary tract  symptoms.  Urine culture ordered we will start patient on antibiotic.  CRITICAL CARE-no Performed by: Daleen Bo  Nursing Notes Reviewed/ Care Coordinated Applicable Imaging Reviewed Interpretation of Laboratory Data incorporated into ED treatment  The patient appears reasonably screened and/or stabilized for discharge and I doubt any other medical condition or other [facility] requiring further screening, evaluation, or treatment in the ED at this time prior to discharge.  Plan: Home Medications-continue usual medicines; Home Treatments-rest, fluids; return here if the recommended treatment, does not improve the symptoms; Recommended follow up-PCP checkup next week and as needed     Final Clinical Impression(s) / ED Diagnoses Final diagnoses:  Urinary tract infection without hematuria, site unspecified  Other fatigue  Tachycardia    Rx / DC Orders ED Discharge Orders         Ordered    cephALEXin (KEFLEX) 500 MG capsule  4 times daily        03/19/20 2040           Daleen Bo, MD 03/19/20 2220

## 2020-03-19 NOTE — ED Notes (Signed)
Patient aware we need urine sample.  

## 2020-03-19 NOTE — ED Triage Notes (Signed)
Pt c/o tachycardia, hypertension, and weakness for several weeks, went to PCP 2 wks ago and states PCP changed bp medication. Denies pain.

## 2020-03-19 NOTE — Discharge Instructions (Addendum)
It appears that you have a urinary tract infection that is causing you to be weak and tired.  It may also be increasing heart rate.  Make sure that you are eating and drinking regularly.  We sent a prescription antibiotic to your pharmacy to start taking tomorrow morning.  Follow-up with your doctor next week for checkup.

## 2020-04-24 ENCOUNTER — Emergency Department (HOSPITAL_COMMUNITY): Payer: Medicare Other | Admitting: Certified Registered Nurse Anesthetist

## 2020-04-24 ENCOUNTER — Encounter (HOSPITAL_COMMUNITY): Payer: Self-pay

## 2020-04-24 ENCOUNTER — Emergency Department (HOSPITAL_COMMUNITY): Payer: Medicare Other

## 2020-04-24 ENCOUNTER — Other Ambulatory Visit: Payer: Self-pay | Admitting: Urology

## 2020-04-24 ENCOUNTER — Inpatient Hospital Stay (HOSPITAL_COMMUNITY)
Admission: EM | Admit: 2020-04-24 | Discharge: 2020-04-27 | DRG: 854 | Disposition: A | Discharge status: Home / Self Care | Payer: Medicare Other | Attending: Internal Medicine | Admitting: Internal Medicine

## 2020-04-24 ENCOUNTER — Other Ambulatory Visit: Payer: Self-pay

## 2020-04-24 ENCOUNTER — Encounter (HOSPITAL_COMMUNITY)
Admission: EM | Disposition: A | Discharge status: Home / Self Care | Payer: Self-pay | Source: Home / Self Care | Attending: Internal Medicine

## 2020-04-24 DIAGNOSIS — I10 Essential (primary) hypertension: Secondary | ICD-10-CM | POA: Diagnosis present

## 2020-04-24 DIAGNOSIS — A419 Sepsis, unspecified organism: Principal | ICD-10-CM

## 2020-04-24 DIAGNOSIS — Z79899 Other long term (current) drug therapy: Secondary | ICD-10-CM

## 2020-04-24 DIAGNOSIS — Z20822 Contact with and (suspected) exposure to covid-19: Secondary | ICD-10-CM | POA: Diagnosis present

## 2020-04-24 DIAGNOSIS — K219 Gastro-esophageal reflux disease without esophagitis: Secondary | ICD-10-CM | POA: Diagnosis present

## 2020-04-24 DIAGNOSIS — R6520 Severe sepsis without septic shock: Secondary | ICD-10-CM

## 2020-04-24 DIAGNOSIS — N200 Calculus of kidney: Secondary | ICD-10-CM

## 2020-04-24 DIAGNOSIS — D860 Sarcoidosis of lung: Secondary | ICD-10-CM | POA: Diagnosis present

## 2020-04-24 DIAGNOSIS — Z800 Family history of malignant neoplasm of digestive organs: Secondary | ICD-10-CM

## 2020-04-24 DIAGNOSIS — Z8249 Family history of ischemic heart disease and other diseases of the circulatory system: Secondary | ICD-10-CM | POA: Diagnosis not present

## 2020-04-24 DIAGNOSIS — Z7984 Long term (current) use of oral hypoglycemic drugs: Secondary | ICD-10-CM | POA: Diagnosis not present

## 2020-04-24 DIAGNOSIS — B9620 Unspecified Escherichia coli [E. coli] as the cause of diseases classified elsewhere: Secondary | ICD-10-CM | POA: Diagnosis present

## 2020-04-24 DIAGNOSIS — D509 Iron deficiency anemia, unspecified: Secondary | ICD-10-CM | POA: Diagnosis present

## 2020-04-24 DIAGNOSIS — E119 Type 2 diabetes mellitus without complications: Secondary | ICD-10-CM | POA: Diagnosis not present

## 2020-04-24 DIAGNOSIS — D3502 Benign neoplasm of left adrenal gland: Secondary | ICD-10-CM | POA: Diagnosis not present

## 2020-04-24 DIAGNOSIS — N202 Calculus of kidney with calculus of ureter: Secondary | ICD-10-CM | POA: Diagnosis present

## 2020-04-24 DIAGNOSIS — Z6834 Body mass index (BMI) 34.0-34.9, adult: Secondary | ICD-10-CM

## 2020-04-24 DIAGNOSIS — D3501 Benign neoplasm of right adrenal gland: Secondary | ICD-10-CM | POA: Diagnosis present

## 2020-04-24 DIAGNOSIS — E669 Obesity, unspecified: Secondary | ICD-10-CM | POA: Diagnosis not present

## 2020-04-24 DIAGNOSIS — N136 Pyonephrosis: Secondary | ICD-10-CM | POA: Diagnosis not present

## 2020-04-24 HISTORY — PX: CYSTOSCOPY W/ URETERAL STENT PLACEMENT: SHX1429

## 2020-04-24 LAB — PROTIME-INR
INR: 1 (ref 0.8–1.2)
Prothrombin Time: 12.7 seconds (ref 11.4–15.2)

## 2020-04-24 LAB — CBC WITH DIFFERENTIAL/PLATELET
Abs Immature Granulocytes: 0.02 10*3/uL (ref 0.00–0.07)
Basophils Absolute: 0 10*3/uL (ref 0.0–0.1)
Basophils Relative: 0 %
Eosinophils Absolute: 0 10*3/uL (ref 0.0–0.5)
Eosinophils Relative: 0 %
HCT: 37.8 % (ref 36.0–46.0)
Hemoglobin: 11.8 g/dL — ABNORMAL LOW (ref 12.0–15.0)
Immature Granulocytes: 0 %
Lymphocytes Relative: 6 %
Lymphs Abs: 0.3 10*3/uL — ABNORMAL LOW (ref 0.7–4.0)
MCH: 27.3 pg (ref 26.0–34.0)
MCHC: 31.2 g/dL (ref 30.0–36.0)
MCV: 87.5 fL (ref 80.0–100.0)
Monocytes Absolute: 0 10*3/uL — ABNORMAL LOW (ref 0.1–1.0)
Monocytes Relative: 1 %
Neutro Abs: 5.6 10*3/uL (ref 1.7–7.7)
Neutrophils Relative %: 93 %
Platelets: 350 10*3/uL (ref 150–400)
RBC: 4.32 MIL/uL (ref 3.87–5.11)
RDW: 15 % (ref 11.5–15.5)
WBC: 6 10*3/uL (ref 4.0–10.5)
nRBC: 0.3 % — ABNORMAL HIGH (ref 0.0–0.2)

## 2020-04-24 LAB — LACTIC ACID, PLASMA
Lactic Acid, Venous: 4.2 mmol/L (ref 0.5–1.9)
Lactic Acid, Venous: 2.2 mmol/L (ref 0.5–1.9)
Lactic Acid, Venous: 1.5 mmol/L (ref 0.5–1.9)
Lactic Acid, Venous: 1.3 mmol/L (ref 0.5–1.9)

## 2020-04-24 LAB — RESP PANEL BY RT-PCR (FLU A&B, COVID) ARPGX2
Influenza A by PCR: NEGATIVE
Influenza B by PCR: NEGATIVE
SARS Coronavirus 2 by RT PCR: NEGATIVE

## 2020-04-24 LAB — GLUCOSE, CAPILLARY
Glucose-Capillary: 112 mg/dL — ABNORMAL HIGH (ref 70–99)
Glucose-Capillary: 124 mg/dL — ABNORMAL HIGH (ref 70–99)
Glucose-Capillary: 184 mg/dL — ABNORMAL HIGH (ref 70–99)

## 2020-04-24 LAB — APTT: aPTT: 23 seconds — ABNORMAL LOW (ref 24–36)

## 2020-04-24 LAB — COMPREHENSIVE METABOLIC PANEL
ALT: 26 U/L (ref 0–44)
AST: 36 U/L (ref 15–41)
Albumin: 4.4 g/dL (ref 3.5–5.0)
Alkaline Phosphatase: 80 U/L (ref 38–126)
Anion gap: 14 (ref 5–15)
BUN: 21 mg/dL (ref 8–23)
CO2: 20 mmol/L — ABNORMAL LOW (ref 22–32)
Calcium: 10 mg/dL (ref 8.9–10.3)
Chloride: 108 mmol/L (ref 98–111)
Creatinine, Ser: 1.26 mg/dL — ABNORMAL HIGH (ref 0.44–1.00)
GFR, Estimated: 44 mL/min — ABNORMAL LOW (ref >60)
Glucose, Bld: 158 mg/dL — ABNORMAL HIGH (ref 70–99)
Potassium: 4.1 mmol/L (ref 3.5–5.1)
Sodium: 142 mmol/L (ref 135–145)
Total Bilirubin: 1 mg/dL (ref 0.3–1.2)
Total Protein: 7.8 g/dL (ref 6.5–8.1)

## 2020-04-24 SURGERY — CYSTOSCOPY, WITH RETROGRADE PYELOGRAM AND URETERAL STENT INSERTION
Anesthesia: General | Site: Ureter | Laterality: Left

## 2020-04-24 MED ORDER — PROPOFOL 10 MG/ML IV BOLUS
INTRAVENOUS | Status: DC | PRN
  Administered 2020-04-24: 180 mg via INTRAVENOUS

## 2020-04-24 MED ORDER — FENTANYL CITRATE (PF) 100 MCG/2ML IJ SOLN: 25.0000 ug | INTRAMUSCULAR | Status: DC | PRN

## 2020-04-24 MED ORDER — IOHEXOL 300 MG/ML  SOLN
INTRAMUSCULAR | Status: DC | PRN
  Administered 2020-04-24: 5 mL

## 2020-04-24 MED ORDER — BISACODYL 10 MG RE SUPP: 10.0000 mg | Freq: Every day | RECTAL | Status: DC | PRN

## 2020-04-24 MED ORDER — ONDANSETRON HCL 4 MG/2ML IJ SOLN
INTRAMUSCULAR | Status: AC
  Filled 2020-04-24: qty 2

## 2020-04-24 MED ORDER — FENTANYL CITRATE (PF) 100 MCG/2ML IJ SOLN
INTRAMUSCULAR | Status: AC
  Filled 2020-04-24: qty 2

## 2020-04-24 MED ORDER — LACTATED RINGERS IV BOLUS (SEPSIS): 1000.0000 mL | Freq: Once | INTRAVENOUS | Status: DC

## 2020-04-24 MED ORDER — FENTANYL CITRATE (PF) 100 MCG/2ML IJ SOLN
INTRAMUSCULAR | Status: DC | PRN
  Administered 2020-04-24: 50 ug via INTRAVENOUS

## 2020-04-24 MED ORDER — OXYCODONE HCL 5 MG PO TABS: 5.0000 mg | ORAL_TABLET | ORAL | Status: DC | PRN

## 2020-04-24 MED ORDER — ACETAMINOPHEN 325 MG PO TABS
650.0000 mg | ORAL_TABLET | Freq: Once | ORAL | Status: AC
  Administered 2020-04-24: 650 mg via ORAL
  Filled 2020-04-24: qty 2

## 2020-04-24 MED ORDER — PHENYLEPHRINE 40 MCG/ML (10ML) SYRINGE FOR IV PUSH (FOR BLOOD PRESSURE SUPPORT)
PREFILLED_SYRINGE | INTRAVENOUS | Status: DC | PRN
  Administered 2020-04-24: 120 ug via INTRAVENOUS
  Administered 2020-04-24: 80 ug via INTRAVENOUS

## 2020-04-24 MED ORDER — TRAMADOL HCL 50 MG PO TABS
50.0000 mg | ORAL_TABLET | Freq: Three times a day (TID) | ORAL | Status: DC | PRN
  Administered 2020-04-24: 50 mg via ORAL
  Filled 2020-04-24: qty 1

## 2020-04-24 MED ORDER — CHLORHEXIDINE GLUCONATE 0.12 % MT SOLN
15.0000 mL | OROMUCOSAL | Status: AC
  Administered 2020-04-24: 15 mL via OROMUCOSAL

## 2020-04-24 MED ORDER — CEFAZOLIN SODIUM-DEXTROSE 2-4 GM/100ML-% IV SOLN
INTRAVENOUS | Status: AC
  Filled 2020-04-24: qty 100

## 2020-04-24 MED ORDER — ONDANSETRON HCL 4 MG/2ML IJ SOLN: 4.0000 mg | Freq: Four times a day (QID) | INTRAMUSCULAR | Status: DC | PRN

## 2020-04-24 MED ORDER — ONDANSETRON HCL 4 MG/2ML IJ SOLN
INTRAMUSCULAR | Status: DC | PRN
  Administered 2020-04-24: 4 mg via INTRAVENOUS

## 2020-04-24 MED ORDER — POLYETHYLENE GLYCOL 3350 17 G PO PACK: 17.0000 g | PACK | Freq: Every day | ORAL | Status: DC | PRN

## 2020-04-24 MED ORDER — DEXAMETHASONE SODIUM PHOSPHATE 10 MG/ML IJ SOLN
INTRAMUSCULAR | Status: AC
  Filled 2020-04-24: qty 1

## 2020-04-24 MED ORDER — SENNA 8.6 MG PO TABS
1.0000 | ORAL_TABLET | Freq: Two times a day (BID) | ORAL | Status: DC
  Administered 2020-04-24 – 2020-04-27 (×6): 8.6 mg via ORAL
  Filled 2020-04-24 (×6): qty 1

## 2020-04-24 MED ORDER — DEXAMETHASONE SODIUM PHOSPHATE 10 MG/ML IJ SOLN
INTRAMUSCULAR | Status: DC | PRN
  Administered 2020-04-24: 4 mg via INTRAVENOUS

## 2020-04-24 MED ORDER — SODIUM CHLORIDE 0.9 % IV SOLN
2.0000 g | INTRAVENOUS | Status: DC
  Administered 2020-04-25 – 2020-04-26 (×2): 2 g via INTRAVENOUS
  Filled 2020-04-24: qty 20
  Filled 2020-04-24 (×2): qty 2

## 2020-04-24 MED ORDER — SUCCINYLCHOLINE CHLORIDE 200 MG/10ML IV SOSY
PREFILLED_SYRINGE | INTRAVENOUS | Status: DC | PRN
  Administered 2020-04-24: 160 mg via INTRAVENOUS

## 2020-04-24 MED ORDER — LACTATED RINGERS IV BOLUS (SEPSIS)
1000.0000 mL | Freq: Once | INTRAVENOUS | Status: AC
  Administered 2020-04-24: 1000 mL via INTRAVENOUS

## 2020-04-24 MED ORDER — MORPHINE SULFATE (PF) 4 MG/ML IV SOLN
2.0000 mg | INTRAVENOUS | Status: DC | PRN
Start: 2020-04-24 — End: 2020-04-25

## 2020-04-24 MED ORDER — ONDANSETRON HCL 4 MG PO TABS: 4.0000 mg | ORAL_TABLET | Freq: Four times a day (QID) | ORAL | Status: DC | PRN

## 2020-04-24 MED ORDER — STERILE WATER FOR IRRIGATION IR SOLN
Status: DC | PRN
  Administered 2020-04-24: 3000 mL

## 2020-04-24 MED ORDER — ENOXAPARIN SODIUM 40 MG/0.4ML ~~LOC~~ SOLN
40.0000 mg | SUBCUTANEOUS | Status: DC
  Administered 2020-04-25 – 2020-04-27 (×3): 40 mg via SUBCUTANEOUS
  Filled 2020-04-24 (×3): qty 0.4

## 2020-04-24 MED ORDER — PROPOFOL 10 MG/ML IV BOLUS
INTRAVENOUS | Status: AC
  Filled 2020-04-24: qty 20

## 2020-04-24 MED ORDER — SODIUM CHLORIDE 0.9 % IV SOLN
2.0000 g | INTRAVENOUS | Status: AC
  Administered 2020-04-24: 2 g via INTRAVENOUS
  Filled 2020-04-24: qty 20

## 2020-04-24 MED ORDER — LACTATED RINGERS IV SOLN: INTRAVENOUS | Status: DC

## 2020-04-24 MED ORDER — INSULIN ASPART 100 UNIT/ML ~~LOC~~ SOLN: 0.0000 [IU] | Freq: Every day | SUBCUTANEOUS | Status: DC

## 2020-04-24 MED ORDER — LIDOCAINE 2% (20 MG/ML) 5 ML SYRINGE
INTRAMUSCULAR | Status: DC | PRN
  Administered 2020-04-24: 60 mg via INTRAVENOUS

## 2020-04-24 MED ORDER — ACETAMINOPHEN 325 MG PO TABS: 650.0000 mg | ORAL_TABLET | Freq: Four times a day (QID) | ORAL | Status: DC | PRN

## 2020-04-24 MED ORDER — INSULIN ASPART 100 UNIT/ML ~~LOC~~ SOLN
0.0000 [IU] | Freq: Three times a day (TID) | SUBCUTANEOUS | Status: DC
  Administered 2020-04-25: 1 [IU] via SUBCUTANEOUS
  Administered 2020-04-25: 2 [IU] via SUBCUTANEOUS
  Administered 2020-04-26 – 2020-04-27 (×4): 1 [IU] via SUBCUTANEOUS

## 2020-04-24 SURGICAL SUPPLY — 14 items
BAG URO CATCHER STRL LF (MISCELLANEOUS) ×2 IMPLANT
CATH URET 5FR 28IN OPEN ENDED (CATHETERS) ×2 IMPLANT
CLOTH BEACON ORANGE TIMEOUT ST (SAFETY) ×2 IMPLANT
GLOVE SURG ENC TEXT LTX SZ7 (GLOVE) ×2 IMPLANT
GOWN STRL REUS W/TWL LRG LVL3 (GOWN DISPOSABLE) ×4 IMPLANT
GUIDEWIRE STR DUAL SENSOR (WIRE) IMPLANT
GUIDEWIRE ZIPWRE .038 STRAIGHT (WIRE) ×2 IMPLANT
KIT TURNOVER KIT A (KITS) ×2 IMPLANT
MANIFOLD NEPTUNE II (INSTRUMENTS) ×2 IMPLANT
PACK CYSTO (CUSTOM PROCEDURE TRAY) ×2 IMPLANT
STENT URET 6FRX26 CONTOUR (STENTS) ×1 IMPLANT
SYR 10ML LL (SYRINGE) ×2 IMPLANT
TUBING CONNECTING 10 (TUBING) ×2 IMPLANT
TUBING UROLOGY SET (TUBING) IMPLANT

## 2020-04-24 NOTE — Op Note (Signed)
Operative Note  Preoperative diagnosis:  1.  Left ureteral stone 2. Urinary tract infection  Postoperative diagnosis: 1.  Left ureteral stone 2. Urinary tract infection  Procedure(s): 1.  Cystoscopy 2. Left retrograde pyelogram with interpretation 3. Left ureteral stent placement 4. Fluoroscopy <1 hour with intraoperative interpretation  Surgeon: Rexene Alberts, MD  Assistants:  None  Anesthesia:  General  Complications:  None  EBL:  Minimal  Specimens: 1. Bladder stones for analysis - to be analyzed at [facility]. 2. Left renal pelvis urine culture  Drains/Catheters: 1.  6Fr x 26cm ureteral stent  Intraoperative findings:   1. Cystoscopy demonstrated no suspicious lesions, masses. There were two separate stones within the bladder which were drained and sent off as specimen. 2. Left retrograde pyelogram demonstrated left moderate hydronephrosis. 3. Successful left ureteral stent placement with curl in the upper pole and bladder respectively.  Indication:  Diane Wilkerson is a 80 y.o. female with distal left ureteral stone measuring 4 mm signs of sepsis seen on CT A/P 04/24/2020.  After reviewing the management options for treatment, she elected to proceed with the above surgical procedure(s). We have discussed the potential benefits and risks of the procedure, side effects of the proposed treatment, the likelihood of the patient achieving the goals of the procedure, and any potential problems that might occur during the procedure or recuperation. Informed consent has been obtained.  Description of procedure: The patient was taken to the operating room and general anesthesia was induced.  The patient was placed in the dorsal lithotomy position, prepped and draped in the usual sterile fashion, and preoperative antibiotics were administered. A preoperative time-out was performed.   Cystourethroscopy was performed.  The patient's urethra was examined and was normal. The bladder was then  systematically examined in its entirety. There was no evidence for any bladder tumors, stones, or other mucosal pathology.    Attention then turned to the left ureteral orifice. A 0.038 zip wire was passed through the left orifice and over the wire a 5 Fr open ended catheter was inserted and passed up to the level of the renal pelvis. Aspirate was obtained and sent off as left renal pelvis urine for culture. Omnipaque contrast was injected through the ureteral catheter and a retrograde pyelogram was performed with findings as dictated above. The wire was then replaced and the open ended catheter was removed.   A 6Fr x 26cm ureteral stent was advance over the wire. The stent was positioned appropriately under fluoroscopic and cystoscopic guidance.  The wire was then removed with an adequate stent curl noted in the left upper pole as well as in the bladder.  The bladder was then emptied and the procedure ended.  The patient appeared to tolerate the procedure well and without complications.  The patient was able to be awakened and transferred to the recovery unit in satisfactory condition.   Plan: Admit to medicine given sepsis and comorbidities.  Continue broad-spectrum antibiotics.  I have messaged schedulers to arrange for definitive management of her left ureteral and renal stone.  [facility]  Pager: [phone]

## 2020-04-24 NOTE — ED Notes (Signed)
Lactate of 4.2, Green PA-C informed at this time

## 2020-04-24 NOTE — Anesthesia Procedure Notes (Addendum)
Procedure Name: Intubation Date/Time: 04/24/2020 3:29 PM Performed by: West Pugh, CRNA Pre-anesthesia Checklist: Patient identified, Emergency Drugs available, Suction available, Patient being monitored and Timeout performed Patient Re-evaluated:Patient Re-evaluated prior to induction Oxygen Delivery Method: Circle system utilized Preoxygenation: Pre-oxygenation with 100% oxygen Induction Type: IV induction, Rapid sequence and Cricoid Pressure applied Laryngoscope Size: Mac and 3 Grade View: Grade II Tube type: Oral Tube size: 7.0 mm Number of attempts: 1 Airway Equipment and Method: Stylet Placement Confirmation: ETT inserted through vocal cords under direct vision,  positive ETCO2 and breath sounds checked- equal and bilateral Secured at: 21 cm Tube secured with: Tape Dental Injury: Teeth and Oropharynx as per pre-operative assessment  Comments: AOI

## 2020-04-24 NOTE — ED Notes (Addendum)
Fall risk bundle: Yellow wrist band Bed alarm on Patient wanted to wear her slippers with skid protectors instead of socks Fall risk sign outside of door

## 2020-04-24 NOTE — ED Notes (Signed)
Patient attached to external female catheter 

## 2020-04-24 NOTE — ED Provider Notes (Signed)
I provided a substantive portion of the care of this patient.  I personally performed the entirety of the medical decision making for this encounter.  EKG Interpretation  Date/Time:  Thursday April 24 2020 13:23:22 EDT Ventricular Rate:  127 PR Interval:  158 QRS Duration: 73 QT Interval:  287 QTC Calculation: 418 R Axis:   79 Text Interpretation: Sinus tachycardia Borderline T abnormalities, inferior leads Confirmed by Lacretia Leigh ([id number]) on 04/24/2020 1:46:39 PM  80 year old female presents from urology with fever and flank pain.  Started on antibiotics here after code sepsis started.  Patient with likely infected kidney stone and will go to the OR for stent and be admitted to the hospital service   Lacretia Leigh, MD 04/24/20 [phone]

## 2020-04-24 NOTE — Sepsis Progress Note (Signed)
To OR for cystoscopy, left retrograde pyelogram, left ureteral stent placement

## 2020-04-24 NOTE — Transfer of Care (Signed)
Immediate Anesthesia Transfer of Care Note  Patient: Diane Wilkerson  Procedure(s) Performed: CYSTOSCOPY WITH RETROGRADE PYELOGRAM/URETERAL STENT PLACEMENT (Left Ureter)  Patient Location: PACU  Anesthesia Type:General  Level of Consciousness: awake, drowsy and patient cooperative  Airway & Oxygen Therapy: Patient Spontanous Breathing and Patient connected to face mask oxygen  Post-op Assessment: Report given to RN and Post -op Vital signs reviewed and stable  Post vital signs: Reviewed and stable  Last Vitals:  Vitals Value Taken Time  BP 143/62 04/24/20 1601  Temp    Pulse 108 04/24/20 1605  Resp 20 04/24/20 1605  SpO2 100 % 04/24/20 1605  Vitals shown include unvalidated device data.  Last Pain:  Vitals:   04/24/20 1505  TempSrc:   PainSc: 0-No pain         Complications: No complications documented.

## 2020-04-24 NOTE — H&P (Addendum)
Urology Preoperative H&P   Chief Complaint: Left flank pain, obstructing left ureteral stone  History of Present Illness: Diane Wilkerson is a 80 y.o. female who presents the office today with acute onset left flank pain.  She was febrile at 102.  CT A/P 04/24/2020 revealed a 76mm distal left ureteral stone with signs of obstruction and hydronephrosis. Also present was a 54mm left lower pole stone. She was experiencing chills and rigors and sent to the emergency department where she received Rocephin.  She is being brought to the operating today for cystoscopy, left retrograde pyelogram, left ureteral stent placement.  She does have a prior history of urolithiasis.  She presented the ED on 05/05/2019-found to have an obstructing left UVJ stone with signs of sepsis.  She then had a basket extraction of the stone by Dr. Patsy [place] on 05/06/2019.  Past Medical History:  Diagnosis Date  . Essential hypertension 05/06/2019  . Pulmonary sarcoidosis ([place])   . Uncontrolled type 2 diabetes mellitus with hyperglycemia, without long-term current use of insulin ([place]) 05/06/2019    Past Surgical History:  Procedure Laterality Date  . CYSTOSCOPY WITH STENT PLACEMENT Left 05/06/2019   Procedure: Cystoscopy with retrograde pyleogram and left stent placement, basket removal of stone foley placement;  Surgeon: Festus Aloe, MD;  Location: [facility];  Service: Urology;  Laterality: Left;    Allergies: No Known Allergies  Family History  Problem Relation Age of Onset  . Pancreatic cancer Mother   . Heart disease Father     Social History:  reports that she has never smoked. She has never used smokeless tobacco. She reports previous alcohol use. She reports that she does not use drugs.  ROS: A complete review of systems was performed.  All systems are negative except for pertinent findings as noted.  Physical Exam:  Vital signs in last 24 hours: Temp:  [100.1 F (37.8 C)-102 F (38.9 C)] 100.1 F (37.8 C)  (04/21 1445) Pulse Rate:  [115-138] 117 (04/21 1445) Resp:  [16-29] 16 (04/21 1445) BP: (152-177)/(66-85) 152/68 (04/21 1445) SpO2:  [79 %-100 %] 98 % (04/21 1445) Constitutional:  Alert and oriented, No acute distress Cardiovascular: Regular rate and rhythm Respiratory: Normal respiratory effort, Lungs clear bilaterally GI: Abdomen is soft, nontender, nondistended, no abdominal masses GU: No CVA tenderness Lymphatic: No lymphadenopathy Neurologic: Grossly intact, no focal deficits Psychiatric: Normal mood and affect  Laboratory Data:  Recent Labs    04/24/20 1357  WBC 6.0  HGB 11.8*  HCT 37.8  PLT 350    Recent Labs    04/24/20 1357  NA 142  K 4.1  CL 108  GLUCOSE 158*  BUN 21  CALCIUM 10.0  CREATININE 1.26*     Results for orders placed or performed during the hospital encounter of 04/24/20 (from the past 24 hour(s))  Resp Panel by RT-PCR (Flu A&B, Covid) Nasopharyngeal Swab     Status: None   Collection Time: 04/24/20  1:11 PM   Specimen: Nasopharyngeal Swab; Nasopharyngeal(NP) swabs in vial transport medium  Result Value Ref Range   SARS Coronavirus 2 by RT PCR NEGATIVE NEGATIVE   Influenza A by PCR NEGATIVE NEGATIVE   Influenza B by PCR NEGATIVE NEGATIVE  Comprehensive metabolic panel     Status: Abnormal   Collection Time: 04/24/20  1:57 PM  Result Value Ref Range   Sodium 142 135 - 145 mmol/L   Potassium 4.1 3.5 - 5.1 mmol/L   Chloride 108 98 - 111 mmol/L  CO2 20 (L) 22 - 32 mmol/L   Glucose, Bld 158 (H) 70 - 99 mg/dL   BUN 21 8 - 23 mg/dL   Creatinine, Ser 1.26 (H) 0.44 - 1.00 mg/dL   Calcium 10.0 8.9 - 10.3 mg/dL   Total Protein 7.8 6.5 - 8.1 g/dL   Albumin 4.4 3.5 - 5.0 g/dL   AST 36 15 - 41 U/L   ALT 26 0 - 44 U/L   Alkaline Phosphatase 80 38 - 126 U/L   Total Bilirubin 1.0 0.3 - 1.2 mg/dL   GFR, Estimated 44 (L) >60 mL/min   Anion gap 14 5 - 15  CBC with Differential     Status: Abnormal   Collection Time: 04/24/20  1:57 PM  Result Value  Ref Range   WBC 6.0 4.0 - 10.5 K/uL   RBC 4.32 3.87 - 5.11 MIL/uL   Hemoglobin 11.8 (L) 12.0 - 15.0 g/dL   HCT 37.8 36.0 - 46.0 %   MCV 87.5 80.0 - 100.0 fL   MCH 27.3 26.0 - 34.0 pg   MCHC 31.2 30.0 - 36.0 g/dL   RDW 15.0 11.5 - 15.5 %   Platelets 350 150 - 400 K/uL   nRBC 0.3 (H) 0.0 - 0.2 %   Neutrophils Relative % 93 %   Neutro Abs 5.6 1.7 - 7.7 K/uL   Lymphocytes Relative 6 %   Lymphs Abs 0.3 (L) 0.7 - 4.0 K/uL   Monocytes Relative 1 %   Monocytes Absolute 0.0 (L) 0.1 - 1.0 K/uL   Eosinophils Relative 0 %   Eosinophils Absolute 0.0 0.0 - 0.5 K/uL   Basophils Relative 0 %   Basophils Absolute 0.0 0.0 - 0.1 K/uL   WBC Morphology DOHLE BODIES    Immature Granulocytes 0 %   Abs Immature Granulocytes 0.02 0.00 - 0.07 K/uL   Dohle Bodies PRESENT    Polychromasia PRESENT   Protime-INR     Status: None   Collection Time: 04/24/20  1:57 PM  Result Value Ref Range   Prothrombin Time 12.7 11.4 - 15.2 seconds   INR 1.0 0.8 - 1.2  APTT     Status: Abnormal   Collection Time: 04/24/20  1:57 PM  Result Value Ref Range   aPTT 23 (L) 24 - 36 seconds  Lactic acid, plasma     Status: Abnormal   Collection Time: 04/24/20  1:59 PM  Result Value Ref Range   Lactic Acid, Venous 4.2 (HH) 0.5 - 1.9 mmol/L   Recent Results (from the past 240 hour(s))  Resp Panel by RT-PCR (Flu A&B, Covid) Nasopharyngeal Swab     Status: None   Collection Time: 04/24/20  1:11 PM   Specimen: Nasopharyngeal Swab; Nasopharyngeal(NP) swabs in vial transport medium  Result Value Ref Range Status   SARS Coronavirus 2 by RT PCR NEGATIVE NEGATIVE Final    Comment: (NOTE) SARS-CoV-2 target nucleic acids are NOT DETECTED.  The SARS-CoV-2 RNA is generally detectable in upper respiratory specimens during the acute phase of infection. The lowest concentration of SARS-CoV-2 viral copies this assay can detect is 138 copies/mL. A negative result does not preclude SARS-Cov-2 infection and should not be used as the sole  basis for treatment or other patient management decisions. A negative result may occur with  improper specimen collection/handling, submission of specimen other than nasopharyngeal swab, presence of viral mutation(s) within the areas targeted by this assay, and inadequate number of viral copies(<138 copies/mL). A negative result must be combined with  clinical observations, patient history, and epidemiological information. The expected result is Negative.  Fact Sheet for Patients:  EntrepreneurPulse.com.au  Fact Sheet for Healthcare Providers:  IncredibleEmployment.be  This test is no t yet approved or cleared by the [place] FDA and  has been authorized for detection and/or diagnosis of SARS-CoV-2 by FDA under an Emergency Use Authorization (EUA). This EUA will remain  in effect (meaning this test can be used) for the duration of the COVID-19 declaration under Section 564(b)(1) of the Act, 21 U.S.C.section 360bbb-3(b)(1), unless the authorization is terminated  or revoked sooner.       Influenza A by PCR NEGATIVE NEGATIVE Final   Influenza B by PCR NEGATIVE NEGATIVE Final    Comment: (NOTE) The Xpert Xpress SARS-CoV-2/FLU/RSV plus assay is intended as an aid in the diagnosis of influenza from Nasopharyngeal swab specimens and should not be used as a sole basis for treatment. Nasal washings and aspirates are unacceptable for Xpert Xpress SARS-CoV-2/FLU/RSV testing.  Fact Sheet for Patients: EntrepreneurPulse.com.au  Fact Sheet for Healthcare Providers: IncredibleEmployment.be  This test is not yet approved or cleared by the [place] FDA and has been authorized for detection and/or diagnosis of SARS-CoV-2 by FDA under an Emergency Use Authorization (EUA). This EUA will remain in effect (meaning this test can be used) for the duration of the COVID-19 declaration under Section 564(b)(1) of the Act,  21 U.S.C. section 360bbb-3(b)(1), unless the authorization is terminated or revoked.  Performed at [facility] [address], [place], [place] [address]     Renal Function: Recent Labs    04/24/20 1357  CREATININE 1.26*   CrCl cannot be calculated (Unknown ideal weight.).  Radiologic Imaging: DG Chest Port 1 View  Result Date: 04/24/2020 CLINICAL DATA:  Questionable sepsis. EXAM: PORTABLE CHEST 1 VIEW COMPARISON:  03/05/2020. FINDINGS: Mediastinum and hilar structures normal. Heart size normal. Low lung volumes with mild bibasilar atelectasis. No pleural effusion or pneumothorax. IMPRESSION: Low lung volumes with mild bibasilar atelectasis. Electronically Signed   By: Marcello Moores  Register   On: 04/24/2020 14:10   CT Renal Stone Study  Result Date: 04/24/2020 CLINICAL DATA:  Flank pain.  Fever. EXAM: CT ABDOMEN AND PELVIS WITHOUT CONTRAST TECHNIQUE: Multidetector CT imaging of the abdomen and pelvis was performed following the standard protocol without IV contrast. COMPARISON:  CT urogram earlier the same day on 04/24/2020 at [facility]. 05/06/2019. FINDINGS: Lower chest: 5 mm right lower lobe pulmonary nodule on image 1/series 4 has been incompletely visualized. 2 mm right lower lobe nodule visible in the paraspinal lung on 18/4. 4 mm left lower lobe nodule identified in the left lower lobe on image 2/4. The 5 mm right lower lobe and 4 mm left lower lobe pulmonary nodules are stable since a CT chest 05/06/2019 consistent with benign etiology. 2 mm right lower lobe nodule seen today was obscured by atelectasis previously but is also almost assuredly benign. Hepatobiliary: The liver shows diffusely decreased attenuation suggesting fat deposition. No focal abnormality in the liver on this study without intravenous contrast. There is no evidence for gallstones, gallbladder wall thickening, or pericholecystic fluid. No intrahepatic or extrahepatic biliary  dilation. Pancreas: No focal mass lesion. No dilatation of the main duct. No intraparenchymal cyst. No peripancreatic edema. Spleen: 2.2 cm hypoattenuating lesion in the inferior spleen is not substantially changed since 05/06/2019 suggesting benign etiology. Lesion approaches water density and is likely a cyst or pseudocyst. Adrenals/Urinary Tract: Stable 2.8 cm right adrenal nodule compatible with  adenoma. 1.5 cm left adrenal nodule also compatible with adenoma. Stable 3.6 cm water density lesion upper pole right kidney compatible with a cyst. 2.5 cm water density lesion in the upper interpolar left kidney also unchanged compatible with a cyst. Tiny low-density lesion towards the upper pole left kidney is too small to characterize. No stones are seen in the right kidney or ureter. 5 mm stone is noted in the lower pole left kidney. There is mild left hydronephrosis with mild left hydroureter. Ureteral obstruction is due to a 4 x 4 x 2 mm stone in the distal left ureter, best seen on coronal image 99/5. Of note, there are numerous phleboliths in both gonadal veins coursing along the track of both ureters. Insert no bladder stones Stomach/Bowel: Small hiatal hernia. Stomach otherwise unremarkable. Duodenum is normally positioned as is the ligament of Treitz. No small bowel wall thickening. No small bowel dilatation. The terminal ileum is normal. The appendix is normal. No gross colonic mass. No colonic wall thickening. Diverticuli are seen scattered along the entire length of the colon without CT findings of diverticulitis. Vascular/Lymphatic: There is abdominal aortic atherosclerosis without aneurysm. There is no gastrohepatic or hepatoduodenal ligament lymphadenopathy. No retroperitoneal or mesenteric lymphadenopathy. No pelvic sidewall lymphadenopathy. Reproductive: Multiple uterine fibroids evident. There is no adnexal mass. Other: No intraperitoneal free fluid. Musculoskeletal: No worrisome lytic or sclerotic  osseous abnormality. IMPRESSION: 1. 4 x 4 x 2 mm distal left ureteral stone causes mild left hydroureteronephrosis. Of note, there are numerous phleboliths in both gonadal veins coursing along the track of the ureters. 2. 5 mm nonobstructing stone lower pole left kidney. No evidence for right renal or ureteral stones. 3. Bilateral renal cysts. 4. Hepatic steatosis. 5. Stable bilateral adrenal adenomas. 6. Small hiatal hernia. 7. Aortic Atherosclerosis (ICD10-I70.0). Electronically Signed   By: Misty Stanley M.D.   On: 04/24/2020 14:55    I independently reviewed the above imaging studies.  Assessment and Plan Diane Wilkerson is a 80 y.o. female with: #1.  Obstructing left ureteral stone measuring 58mm with hydronephrosis and signs of sepsis 2.  Sepsis due to urinary source  -2 OR for cystoscopy, left retrograde pyelogram, left ureteral stent placement -The risks, benefits and alternatives of cystoscopy with left JJ stent placement was discussed with the patient.  Risks include, but are not limited to: bleeding, urinary tract infection, ureteral injury, ureteral stricture disease, chronic pain, urinary symptoms, bladder injury, stent migration, the need for nephrostomy tube placement, MI, CVA, DVT, PE and the inherent risks with general anesthesia.  The patient voices understanding and wishes to proceed.  -Continue broad-spectrum antibiotics -Follow-up urine culture -Will need definitive management of stones as outpatient.  She saw Dr. Claudia Desanctis in 06/2019.  I will ensure that she has follow-up to treat these stones. -Admit to medicine given comorbidities and sepsis.   Matt R. Adeoluwa Silvers MD 04/24/2020, 3:03 PM  [facility] Pager: [phone]): [phone]

## 2020-04-24 NOTE — Addendum Note (Signed)
Addendum  created 04/24/20 1754 by West Pugh, CRNA   Clinical Note Signed, Flowsheet accepted, Intraprocedure Blocks edited, Intraprocedure Flowsheets edited

## 2020-04-24 NOTE — Sepsis Progress Note (Signed)
Code sepsis protocol being monitored by eLink. 

## 2020-04-24 NOTE — ED Provider Notes (Signed)
[facility] [place] DEPT Provider Note   CSN: [id number] Arrival date & time: 04/24/20  1301     History Chief Complaint  Patient presents with  . Fatigue    Diane Wilkerson is a 80 y.o. female with past medical history significant for GERD, pulmonary sarcoidosis, HTN, and hospital admission 05/2019 for urosepsis who presents to the ED sent from urology for fever.  During that hospital admission, her urine culture was suggestive of E. coli.  She was most treated in the ED on 03/19/2020 for UTI, but no culture was obtained.  I obtained history from Dr. Abner Greenspan, urology, who states that she had seen one of his NP's today.  Given that she was febrile and ill-appearing, they referred her to the ED for hospitalist admission for IV antibiotics until they can schedule time with the OR for ureteral stent placement given suspicion for infected stone.  On my examination, patient reports that yesterday she was simply feeling drowsy and fatigued.  Around 7 PM, she developed acute onset left-sided flank pain.  Since then, she has been feeling progressively weak which is what prompted her to call her urologist.    While she is tachycardic here in the ED, she denies any chest pain or shortness of breath.  She does endorse mild palpitations and states that this is something that has happened before.  She also denies any abdominal pain, nausea or vomiting, diarrhea, or other symptoms.  HPI     Past Medical History:  Diagnosis Date  . Essential hypertension 05/06/2019  . Pulmonary sarcoidosis ([place])   . Uncontrolled type 2 diabetes mellitus with hyperglycemia, without long-term current use of insulin ([place]) 05/06/2019    Patient Active Problem List   Diagnosis Date Noted  . E coli bacteremia 05/07/2019  . Complicated UTI (urinary tract infection) 05/07/2019  . Left ureteral stone 05/07/2019  . Hydronephrosis, left 05/07/2019  . Folate deficiency 05/07/2019  . Iron deficiency anemia  05/07/2019  . Essential hypertension 05/06/2019  . Uncontrolled type 2 diabetes mellitus with hyperglycemia, without long-term current use of insulin ([place]) 05/06/2019  . AKI (acute kidney injury) ([place]) 05/06/2019  . Lactic acidosis 05/06/2019  . Hypokalemia, inadequate intake 05/06/2019  . 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 Severe sepsis with acute organ dysfunction due to gram-negative bacteria ([place]) 05/06/2019  . Acute pyelonephritis 05/06/2019    Past Surgical History:  Procedure Laterality Date  . CYSTOSCOPY WITH STENT PLACEMENT Left 05/06/2019   Procedure: Cystoscopy with retrograde pyleogram and left stent placement, basket removal of stone foley placement;  Surgeon: Festus Aloe, MD;  Location: [facility]S;  Service: Urology;  Laterality: Left;     OB History   No obstetric history on file.     Family History  Problem Relation Age of Onset  . Pancreatic cancer Mother   . Heart disease Father     Social History   Tobacco Use  . Smoking status: Never Smoker  . Smokeless tobacco: Never Used  Substance Use Topics  . Alcohol use: Not Currently  . Drug use: Never    Home Medications Prior to Admission medications   Medication Sig Start Date End Date Taking? Authorizing Provider  amLODipine (NORVASC) 10 MG tablet Take 10 mg by mouth daily. 04/23/20  Yes [provider]  cholecalciferol (VITAMIN D3) 25 MCG (1000 UNIT) tablet Take 1,000 Units by mouth daily.   Yes [provider]  Coenzyme Q10 (CO Q 10 PO) Take 1 tablet by mouth daily.  Yes [provider]  Cyanocobalamin (VITAMIN B 12 PO) Take 1 tablet by mouth daily.   Yes [provider]  glipizide-metformin (METAGLIP) 2.5-250 MG tablet Take 1 tablet by mouth 2 (two) times daily. 04/21/19  Yes [provider]  cephALEXin (KEFLEX) 500 MG capsule Take 1 capsule (500 mg total) by mouth 4 (four) times daily. Patient not taking: Reported on 04/24/2020 03/19/20   Daleen Bo, MD   folic acid (FOLVITE) 1 MG tablet Take 1 tablet (1 mg total) by mouth daily. Patient not taking: No sig reported 05/10/19   Raiford Noble Latif, DO  losartan-hydrochlorothiazide (HYZAAR) 100-25 MG tablet Take 1 tablet by mouth daily. Patient not taking: No sig reported 03/26/19   [provider]    Allergies    Patient has no known allergies.  Review of Systems   Review of Systems  All other systems reviewed and are negative.   Physical Exam Updated Vital Signs BP (!) 152/68   Pulse (!) 117   Temp 100.1 F (37.8 C) (Oral)   Resp 16   LMP  (LMP Unknown) Comment: postmenopausal  SpO2 98%   Physical Exam Vitals and nursing note reviewed. Exam conducted with a chaperone present.  Constitutional:      Appearance: She is ill-appearing.  HENT:     Head: Normocephalic and atraumatic.  Eyes:     General: No scleral icterus.    Conjunctiva/sclera: Conjunctivae normal.  Cardiovascular:     Rate and Rhythm: Regular rhythm. Tachycardia present.     Pulses: Normal pulses.  Pulmonary:     Effort: Pulmonary effort is normal. No respiratory distress.  Abdominal:     General: Abdomen is flat. There is no distension.     Palpations: Abdomen is soft.     Tenderness: There is no abdominal tenderness. There is no right CVA tenderness, left CVA tenderness or guarding.     Comments: Soft, nondistended.  No abdominal tenderness.  No CVA tenderness bilaterally.  No overlying skin changes.  Musculoskeletal:        General: Normal range of motion.  Skin:    General: Skin is dry.  Neurological:     General: No focal deficit present.     Mental Status: She is alert and oriented to person, place, and time.     GCS: GCS eye subscore is 4. GCS verbal subscore is 5. GCS motor subscore is 6.  Psychiatric:        Mood and Affect: Mood normal.        Behavior: Behavior normal.        Thought Content: Thought content normal.     ED Results / Procedures / Treatments   Labs (all labs  ordered are listed, but only abnormal results are displayed) Labs Reviewed  COMPREHENSIVE METABOLIC PANEL - Abnormal; Notable for the following components:      Result Value   CO2 20 (*)    Glucose, Bld 158 (*)    Creatinine, Ser 1.26 (*)    GFR, Estimated 44 (*)    All other components within normal limits  LACTIC ACID, PLASMA - Abnormal; Notable for the following components:   Lactic Acid, Venous 4.2 (*)    All other components within normal limits  CBC WITH DIFFERENTIAL/PLATELET - Abnormal; Notable for the following components:   Hemoglobin 11.8 (*)    nRBC 0.3 (*)    Lymphs Abs 0.3 (*)    Monocytes Absolute 0.0 (*)    All other components within  normal limits  APTT - Abnormal; Notable for the following components:   aPTT 23 (*)    All other components within normal limits  RESP PANEL BY RT-PCR (FLU A&B, COVID) ARPGX2  CULTURE, BLOOD (ROUTINE X 2)  CULTURE, BLOOD (ROUTINE X 2)  URINE CULTURE  PROTIME-INR  LACTIC ACID, PLASMA  URINALYSIS, ROUTINE W REFLEX MICROSCOPIC    EKG EKG Interpretation  Date/Time:  Thursday April 24 2020 13:23:22 EDT Ventricular Rate:  127 PR Interval:  158 QRS Duration: 73 QT Interval:  287 QTC Calculation: 418 R Axis:   79 Text Interpretation: Sinus tachycardia Borderline T abnormalities, inferior leads Confirmed by Lacretia Leigh ([id number]) on 04/24/2020 1:30:00 PM   Radiology DG Chest Port 1 View  Result Date: 04/24/2020 CLINICAL DATA:  Questionable sepsis. EXAM: PORTABLE CHEST 1 VIEW COMPARISON:  03/05/2020. FINDINGS: Mediastinum and hilar structures normal. Heart size normal. Low lung volumes with mild bibasilar atelectasis. No pleural effusion or pneumothorax. IMPRESSION: Low lung volumes with mild bibasilar atelectasis. Electronically Signed   By: Marcello Moores  Register   On: 04/24/2020 14:10   CT Renal Stone Study  Result Date: 04/24/2020 CLINICAL DATA:  Flank pain.  Fever. EXAM: CT ABDOMEN AND PELVIS WITHOUT CONTRAST TECHNIQUE: Multidetector  CT imaging of the abdomen and pelvis was performed following the standard protocol without IV contrast. COMPARISON:  CT urogram earlier the same day on 04/24/2020 at [facility]. 05/06/2019. FINDINGS: Lower chest: 5 mm right lower lobe pulmonary nodule on image 1/series 4 has been incompletely visualized. 2 mm right lower lobe nodule visible in the paraspinal lung on 18/4. 4 mm left lower lobe nodule identified in the left lower lobe on image 2/4. The 5 mm right lower lobe and 4 mm left lower lobe pulmonary nodules are stable since a CT chest 05/06/2019 consistent with benign etiology. 2 mm right lower lobe nodule seen today was obscured by atelectasis previously but is also almost assuredly benign. Hepatobiliary: The liver shows diffusely decreased attenuation suggesting fat deposition. No focal abnormality in the liver on this study without intravenous contrast. There is no evidence for gallstones, gallbladder wall thickening, or pericholecystic fluid. No intrahepatic or extrahepatic biliary dilation. Pancreas: No focal mass lesion. No dilatation of the main duct. No intraparenchymal cyst. No peripancreatic edema. Spleen: 2.2 cm hypoattenuating lesion in the inferior spleen is not substantially changed since 05/06/2019 suggesting benign etiology. Lesion approaches water density and is likely a cyst or pseudocyst. Adrenals/Urinary Tract: Stable 2.8 cm right adrenal nodule compatible with adenoma. 1.5 cm left adrenal nodule also compatible with adenoma. Stable 3.6 cm water density lesion upper pole right kidney compatible with a cyst. 2.5 cm water density lesion in the upper interpolar left kidney also unchanged compatible with a cyst. Tiny low-density lesion towards the upper pole left kidney is too small to characterize. No stones are seen in the right kidney or ureter. 5 mm stone is noted in the lower pole left kidney. There is mild left hydronephrosis with mild left hydroureter. Ureteral  obstruction is due to a 4 x 4 x 2 mm stone in the distal left ureter, best seen on coronal image 99/5. Of note, there are numerous phleboliths in both gonadal veins coursing along the track of both ureters. Insert no bladder stones Stomach/Bowel: Small hiatal hernia. Stomach otherwise unremarkable. Duodenum is normally positioned as is the ligament of Treitz. No small bowel wall thickening. No small bowel dilatation. The terminal ileum is normal. The appendix is normal. No gross colonic mass. No colonic  wall thickening. Diverticuli are seen scattered along the entire length of the colon without CT findings of diverticulitis. Vascular/Lymphatic: There is abdominal aortic atherosclerosis without aneurysm. There is no gastrohepatic or hepatoduodenal ligament lymphadenopathy. No retroperitoneal or mesenteric lymphadenopathy. No pelvic sidewall lymphadenopathy. Reproductive: Multiple uterine fibroids evident. There is no adnexal mass. Other: No intraperitoneal free fluid. Musculoskeletal: No worrisome lytic or sclerotic osseous abnormality. IMPRESSION: 1. 4 x 4 x 2 mm distal left ureteral stone causes mild left hydroureteronephrosis. Of note, there are numerous phleboliths in both gonadal veins coursing along the track of the ureters. 2. 5 mm nonobstructing stone lower pole left kidney. No evidence for right renal or ureteral stones. 3. Bilateral renal cysts. 4. Hepatic steatosis. 5. Stable bilateral adrenal adenomas. 6. Small hiatal hernia. 7. Aortic Atherosclerosis (ICD10-I70.0). Electronically Signed   By: Misty Stanley M.D.   On: 04/24/2020 14:55    Procedures .Critical Care Performed by: Corena Herter, PA-C Authorized by: Corena Herter, PA-C   Critical care provider statement:    Critical care time (minutes):  45   Critical care was necessary to treat or prevent imminent or life-threatening deterioration of the following conditions:  Sepsis   Critical care was time spent personally by me on the  following activities:  Discussions with consultants, evaluation of patient's response to treatment, examination of patient, ordering and performing treatments and interventions, ordering and review of laboratory studies, ordering and review of radiographic studies, pulse oximetry, re-evaluation of patient's condition, obtaining history from patient or surrogate, review of old charts and blood draw for specimens Comments:     Sepsis, lactic acid 4.2.     Medications Ordered in ED Medications  lactated ringers infusion (has no administration in time range)  lactated ringers bolus 1,000 mL (1,000 mLs Intravenous New Bag/Given 04/24/20 1339)    And  lactated ringers bolus 1,000 mL ( Intravenous MAR Hold 04/24/20 1455)    And  lactated ringers bolus 1,000 mL ( Intravenous MAR Hold 04/24/20 1455)  ceFAZolin (ANCEF) 2-4 GM/100ML-% IVPB (has no administration in time range)  acetaminophen (TYLENOL) tablet 650 mg (650 mg Oral Given 04/24/20 1328)  cefTRIAXone (ROCEPHIN) 2 g in sodium chloride 0.9 % 100 mL IVPB (0 g Intravenous Stopped 04/24/20 1407)    ED Course  I have reviewed the triage vital signs and the nursing notes.  Pertinent labs & imaging results that were available during my care of the patient were reviewed by me and considered in my medical decision making (see chart for details).  Clinical Course as of 04/24/20 1501  Thu Apr 24, 2020  1336 I spoke with Dr. Abner Greenspan, urology.  He asked that we admit patient to medicine and he is actively in process of getting her booked with the OR as soon as possible for stent placement. [GG]    Clinical Course User Index [GG] Corena Herter, PA-C   MDM Rules/Calculators/A&P                          VERNIDA MCNICHOLAS was evaluated in Emergency Department on 04/24/2020 for the symptoms described in the history of present illness. She was evaluated in the context of the global COVID-19 pandemic, which necessitated consideration that the patient might be at  risk for infection with the SARS-CoV-2 virus that causes COVID-19. Institutional protocols and algorithms that pertain to the evaluation of patients at risk for COVID-19 are in a state of rapid change based on information  released by regulatory bodies including the CDC and federal and state organizations. These policies and algorithms were followed during the patient's care in the ED.  I personally reviewed patient's medical chart and all notes from triage and staff during today's encounter. I have also ordered and reviewed all labs and imaging that I felt to be medically necessary in the evaluation of this patient's complaints and with consideration of their physical exam. If needed, translation services were available and utilized.   Sepsis work-up initiated given vital signs notable for fever 102 F and tachycardia to 138 bpm.  We will also obtain CT renal stone study given left-sided flank pain and concern for infected stone.  I spoke with Dr. Abner Greenspan, urology.  He asked that we admit patient to medicine and he is actively in process of getting her booked with the OR as soon as possible for stent placement.  Despite PureWick placement, UA was never obtained.  However, suspect urosepsis due to infected stone.    Final Clinical Impression(s) / ED Diagnoses Final diagnoses:  Sepsis with acute organ dysfunction without septic shock, due to unspecified organism, unspecified type [facility])  Kidney stone    Rx / DC Orders ED Discharge Orders    None       Corena Herter, PA-C 04/24/20 1501    Lacretia Leigh, MD 04/29/20 1934

## 2020-04-24 NOTE — Plan of Care (Signed)
Care plan initiated.

## 2020-04-24 NOTE — ED Triage Notes (Signed)
Coming from urology, has obstructing kidney stone, supposed to have surgery, had fever with them, sent over to be medically cleared before surgery

## 2020-04-24 NOTE — Anesthesia Preprocedure Evaluation (Addendum)
Anesthesia Evaluation  Patient identified by MRN, date of birth, ID band Patient awake    Reviewed: Allergy & Precautions, NPO status , Patient's Chart, lab work & pertinent test results  Airway Mallampati: II  TM Distance: >3 FB     Dental   Pulmonary neg pulmonary ROS,    breath sounds clear to auscultation       Cardiovascular hypertension,  Rhythm:Regular Rate:Normal     Neuro/Psych negative neurological ROS     GI/Hepatic negative GI ROS, Neg liver ROS,   Endo/Other  diabetes  Renal/GU Renal disease     Musculoskeletal   Abdominal   Peds  Hematology  (+) anemia ,   Anesthesia Other Findings   Reproductive/Obstetrics                             Anesthesia Physical Anesthesia Plan  ASA: III  Anesthesia Plan: General   Post-op Pain Management:    Induction: Intravenous  PONV Risk Score and Plan: 3 and Ondansetron, Dexamethasone and Midazolam  Airway Management Planned: LMA  Additional Equipment:   Intra-op Plan:   Post-operative Plan:   Informed Consent: I have reviewed the patients History and Physical, chart, labs and discussed the procedure including the risks, benefits and alternatives for the proposed anesthesia with the patient or authorized representative who has indicated his/her understanding and acceptance.     Dental advisory given  Plan Discussed with: CRNA and Anesthesiologist  Anesthesia Plan Comments:         Anesthesia Quick Evaluation

## 2020-04-24 NOTE — H&P (Signed)
History and Physical    Diane Wilkerson CXK:[id number] DOB: 04/07/1940 DOA: 04/24/2020  PCP: Willey Blade, MD Patient coming from: PACU/ED   Chief Complaint: Left flank pain  HPI: 80yo with a history of HTN, pulmonary sarcoidosis, urolithiasis with basket stone extraction May 2021, and DM2 who presented to [facility] complaining of the acute onset of left-sided flank pain.  She was found to have a fever of 102.  CT abdomen/pelvis revealed a 4 mm distal left ureteral stone with obstruction and hydronephrosis as well as a 5 mm left lower pole stone.  Clinically the patient was exhibiting chills with rigors.  She was transferred to the ED and Rocephin was initiated.  She was then taken urgently to the operating room where she underwent cystoscopy with left retrograde pyelogram and left ureteral stent placement.  [facility] were contacted when the patient arrived in the PACU for admission.  Patient is examined in the PACU postoperatively.  She is understandably groggy but interactive and able to answer my questions.  She states she is feeling better overall.  She denies chest pain shortness of breath nausea or vomiting or abdominal pain at this time.  She is alert and oriented.  Assessment/Plan  Obstructing left ureteral stone - left hydronephrosis Now status post surgical relief of obstruction - ongoing care per Urology   SIRS due to acute urologic obstruction versus Sepsis related to Pyelonephritis Continue empiric antibiotic coverage -follow-up culture data from both blood and urine -continue volume challenge for sepsis/SIRS with persisting tachycardia -clinically the patient does appear to be stabilizing-continue to monitor lactic acid which was significantly elevated at admission  Pulmonary sarcoidosis Appears this is well managed and that the patient does not require chronic steroid -no active pulmonary symptoms presently  HTN Blood pressure trending upward but will avoid  overaggressive correction in setting of active sepsis -monitor trend  DM2 Treated with oral agents only at home -hold oral agents and administer SSI for now and monitor CBG  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   DVT prophylaxis: Lovenox Code Status: Full code Family Communication: No family present at time of admission Disposition Plan: Admit to acute units Consults called: Urology to continue to follow  Review of Systems: As per HPI otherwise 10 point review of systems negative.   Past Medical History:  Diagnosis Date  . Essential hypertension 05/06/2019  . Pulmonary sarcoidosis ([place])   . Uncontrolled type 2 diabetes mellitus with hyperglycemia, without long-term current use of insulin ([place]) 05/06/2019    Past Surgical History:  Procedure Laterality Date  . CYSTOSCOPY WITH STENT PLACEMENT Left 05/06/2019   Procedure: Cystoscopy with retrograde pyleogram and left stent placement, basket removal of stone foley placement;  Surgeon: Festus Aloe, MD;  Location: [facility];  Service: Urology;  Laterality: Left;    Family History  Family History  Problem Relation Age of Onset  . Pancreatic cancer Mother   . Heart disease Father     Social History   reports that she has never smoked. She has never used smokeless tobacco. She reports previous alcohol use. She reports that she does not use drugs.  The patient lives in the [place] area with her husband.  Allergies No Known Allergies  Prior to Admission medications   Medication Sig Start  Date End Date Taking? Authorizing Provider  amLODipine (NORVASC) 10 MG tablet Take 10 mg by mouth daily. 04/23/20  Yes [provider]  cholecalciferol (VITAMIN D3) 25 MCG (1000 UNIT) tablet Take 1,000 Units by mouth  daily.   Yes [provider]  Coenzyme Q10 (CO Q 10 PO) Take 1 tablet by mouth daily.   Yes [provider]  Cyanocobalamin (VITAMIN B 12 PO) Take 1 tablet by mouth daily.   Yes [provider]  glipizide-metformin (METAGLIP) 2.5-250 MG tablet Take 1 tablet by mouth 2 (two) times daily. 04/21/19  Yes [provider]  cephALEXin (KEFLEX) 500 MG capsule Take 1 capsule (500 mg total) by mouth 4 (four) times daily. Patient not taking: Reported on 04/24/2020 03/19/20   Daleen Bo, MD  folic acid (FOLVITE) 1 MG tablet Take 1 tablet (1 mg total) by mouth daily. Patient not taking: No sig reported 05/10/19   Raiford Noble Latif, DO  losartan-hydrochlorothiazide (HYZAAR) 100-25 MG tablet Take 1 tablet by mouth daily. Patient not taking: No sig reported 03/26/19   [provider]    Physical Exam: Vitals:   04/24/20 1430 04/24/20 1445 04/24/20 1445 04/24/20 1505  BP: (!) 152/68 (!) 162/85 (!) 152/68   Pulse: (!) 115 (!) 116 (!) 117   Resp: (!) 22 20 16    Temp:   100.1 F (37.8 C)   TempSrc:   Oral   SpO2: 98% 100% 98%   Weight:    86.2 kg  Height:    5\' 2"  (1.575 m)    Constitutional: NAD, calm, comfortable Eyes: PERRL, lids and conjunctivae normal ENMT: Mucous membranes are moist. Normal dentition.  Neck: normal, supple, no masses, no thyromegaly Respiratory: clear to auscultation bilaterally, no wheezing, no crackles. Normal respiratory effort. No accessory muscle use.  Cardiovascular: Tachycardic but regular, no gallop or rub, no appreciable murmur Abdomen: no tenderness, no masses palpated. No hepatosplenomegaly. Bowel sounds positive.  Overweight Musculoskeletal: no clubbing / cyanosis. No joint deformity upper and lower extremities. No contractures. Normal muscle tone.  Skin: no rashes, lesions, ulcers. No induration Neurologic: CN 2-12 grossly intact. Sensation intact, Strength 5/5 in all 4.  Psychiatric: Normal judgment and insight. Alert  and oriented x 3. Normal mood.    Labs on Admission:   CBC: Recent Labs  Lab 04/24/20 1357  WBC 6.0  NEUTROABS 5.6  HGB 11.8*  HCT 37.8  MCV 87.5  PLT 962   Basic Metabolic Panel: Recent Labs  Lab 04/24/20 1357  NA 142  K 4.1  CL 108  CO2 20*  GLUCOSE 158*  BUN 21  CREATININE 1.26*  CALCIUM 10.0   GFR: Estimated Creatinine Clearance: 38.1 mL/min (A) (by C-G formula based on SCr of 1.26 mg/dL (H)).   Liver Function Tests: Recent Labs  Lab 04/24/20 1357  AST 36  ALT 26  ALKPHOS 80  BILITOT 1.0  PROT 7.8  ALBUMIN 4.4   Coagulation Profile: Recent Labs  Lab 04/24/20 1357  INR 1.0   CBG: Recent Labs  Lab 04/24/20 1502  GLUCAP 112*   Urine analysis:    Component Value Date/Time   COLORURINE YELLOW 03/19/2020 2000   APPEARANCEUR CLEAR 03/19/2020 2000   LABSPEC 1.012 03/19/2020 2000   PHURINE 5.0 03/19/2020 2000   GLUCOSEU NEGATIVE 03/19/2020 2000   HGBUR SMALL (A) 03/19/2020 2000   BILIRUBINUR NEGATIVE 03/19/2020 2000   KETONESUR NEGATIVE 03/19/2020 2000   PROTEINUR NEGATIVE 03/19/2020 2000   NITRITE POSITIVE (A) 03/19/2020 2000  LEUKOCYTESUR TRACE (A) 03/19/2020 2000    Recent Results (from the past 240 hour(s))  Resp Panel by RT-PCR (Flu A&B, Covid) Nasopharyngeal Swab     Status: None   Collection Time: 04/24/20  1:11 PM   Specimen: Nasopharyngeal Swab; Nasopharyngeal(NP) swabs in vial transport medium  Result Value Ref Range Status   SARS Coronavirus 2 by RT PCR NEGATIVE NEGATIVE Final    Comment: (NOTE) SARS-CoV-2 target nucleic acids are NOT DETECTED.  The SARS-CoV-2 RNA is generally detectable in upper respiratory specimens during the acute phase of infection. The lowest concentration of SARS-CoV-2 viral copies this assay can detect is 138 copies/mL. A negative result does not preclude SARS-Cov-2 infection and should not be used as the sole basis for treatment or other patient management decisions. A negative result may occur with   improper specimen collection/handling, submission of specimen other than nasopharyngeal swab, presence of viral mutation(s) within the areas targeted by this assay, and inadequate number of viral copies(<138 copies/mL). A negative result must be combined with clinical observations, patient history, and epidemiological information. The expected result is Negative.  Fact Sheet for Patients:  EntrepreneurPulse.com.au  Fact Sheet for Healthcare Providers:  IncredibleEmployment.be  This test is no t yet approved or cleared by the [place] FDA and  has been authorized for detection and/or diagnosis of SARS-CoV-2 by FDA under an Emergency Use Authorization (EUA). This EUA will remain  in effect (meaning this test can be used) for the duration of the COVID-19 declaration under Section 564(b)(1) of the Act, 21 U.S.C.section 360bbb-3(b)(1), unless the authorization is terminated  or revoked sooner.       Influenza A by PCR NEGATIVE NEGATIVE Final   Influenza B by PCR NEGATIVE NEGATIVE Final    Comment: (NOTE) The Xpert Xpress SARS-CoV-2/FLU/RSV plus assay is intended as an aid in the diagnosis of influenza from Nasopharyngeal swab specimens and should not be used as a sole basis for treatment. Nasal washings and aspirates are unacceptable for Xpert Xpress SARS-CoV-2/FLU/RSV testing.  Fact Sheet for Patients: EntrepreneurPulse.com.au  Fact Sheet for Healthcare Providers: IncredibleEmployment.be  This test is not yet approved or cleared by the [place] FDA and has been authorized for detection and/or diagnosis of SARS-CoV-2 by FDA under an Emergency Use Authorization (EUA). This EUA will remain in effect (meaning this test can be used) for the duration of the COVID-19 declaration under Section 564(b)(1) of the Act, 21 U.S.C. section 360bbb-3(b)(1), unless the authorization is terminated  or revoked.  Performed at [facility], [place] [address], [place], [place] [address]      Radiological Exams on Admission: DG Chest Port 1 View  Result Date: 04/24/2020 CLINICAL DATA:  Questionable sepsis. EXAM: PORTABLE CHEST 1 VIEW COMPARISON:  03/05/2020. FINDINGS: Mediastinum and hilar structures normal. Heart size normal. Low lung volumes with mild bibasilar atelectasis. No pleural effusion or pneumothorax. IMPRESSION: Low lung volumes with mild bibasilar atelectasis. Electronically Signed   By: Marcello Moores  Register   On: 04/24/2020 14:10   DG C-Arm 1-60 Min-No Report  Result Date: 04/24/2020 Fluoroscopy was utilized by the requesting physician.  No radiographic interpretation.   CT Renal Stone Study  Result Date: 04/24/2020 CLINICAL DATA:  Flank pain.  Fever. EXAM: CT ABDOMEN AND PELVIS WITHOUT CONTRAST TECHNIQUE: Multidetector CT imaging of the abdomen and pelvis was performed following the standard protocol without IV contrast. COMPARISON:  CT urogram earlier the same day on 04/24/2020 at [facility]. 05/06/2019. FINDINGS: Lower chest: 5 mm right lower  lobe pulmonary nodule on image 1/series 4 has been incompletely visualized. 2 mm right lower lobe nodule visible in the paraspinal lung on 18/4. 4 mm left lower lobe nodule identified in the left lower lobe on image 2/4. The 5 mm right lower lobe and 4 mm left lower lobe pulmonary nodules are stable since a CT chest 05/06/2019 consistent with benign etiology. 2 mm right lower lobe nodule seen today was obscured by atelectasis previously but is also almost assuredly benign. Hepatobiliary: The liver shows diffusely decreased attenuation suggesting fat deposition. No focal abnormality in the liver on this study without intravenous contrast. There is no evidence for gallstones, gallbladder wall thickening, or pericholecystic fluid. No intrahepatic or extrahepatic biliary dilation. Pancreas: No focal mass lesion. No  dilatation of the main duct. No intraparenchymal cyst. No peripancreatic edema. Spleen: 2.2 cm hypoattenuating lesion in the inferior spleen is not substantially changed since 05/06/2019 suggesting benign etiology. Lesion approaches water density and is likely a cyst or pseudocyst. Adrenals/Urinary Tract: Stable 2.8 cm right adrenal nodule compatible with adenoma. 1.5 cm left adrenal nodule also compatible with adenoma. Stable 3.6 cm water density lesion upper pole right kidney compatible with a cyst. 2.5 cm water density lesion in the upper interpolar left kidney also unchanged compatible with a cyst. Tiny low-density lesion towards the upper pole left kidney is too small to characterize. No stones are seen in the right kidney or ureter. 5 mm stone is noted in the lower pole left kidney. There is mild left hydronephrosis with mild left hydroureter. Ureteral obstruction is due to a 4 x 4 x 2 mm stone in the distal left ureter, best seen on coronal image 99/5. Of note, there are numerous phleboliths in both gonadal veins coursing along the track of both ureters. Insert no bladder stones Stomach/Bowel: Small hiatal hernia. Stomach otherwise unremarkable. Duodenum is normally positioned as is the ligament of Treitz. No small bowel wall thickening. No small bowel dilatation. The terminal ileum is normal. The appendix is normal. No gross colonic mass. No colonic wall thickening. Diverticuli are seen scattered along the entire length of the colon without CT findings of diverticulitis. Vascular/Lymphatic: There is abdominal aortic atherosclerosis without aneurysm. There is no gastrohepatic or hepatoduodenal ligament lymphadenopathy. No retroperitoneal or mesenteric lymphadenopathy. No pelvic sidewall lymphadenopathy. Reproductive: Multiple uterine fibroids evident. There is no adnexal mass. Other: No intraperitoneal free fluid. Musculoskeletal: No worrisome lytic or sclerotic osseous abnormality. IMPRESSION: 1. 4 x 4 x 2  mm distal left ureteral stone causes mild left hydroureteronephrosis. Of note, there are numerous phleboliths in both gonadal veins coursing along the track of the ureters. 2. 5 mm nonobstructing stone lower pole left kidney. No evidence for right renal or ureteral stones. 3. Bilateral renal cysts. 4. Hepatic steatosis. 5. Stable bilateral adrenal adenomas. 6. Small hiatal hernia. 7. Aortic Atherosclerosis (ICD10-I70.0). Electronically Signed   By: Misty Stanley M.D.   On: 04/24/2020 14:55    EKG: Independently reviewed. Sinus tachycardia.   Cherene Altes, MD [facility] Office  [phone] Pager - Text Page per Amion as per below:  On-Call/Text Page:      [email or web address]  If 7PM-7AM, please contact night-coverage [email or web address] 04/24/2020, 4:07 PM

## 2020-04-24 NOTE — Anesthesia Postprocedure Evaluation (Signed)
Anesthesia Post Note  Patient: Diane Wilkerson  Procedure(s) Performed: CYSTOSCOPY WITH RETROGRADE PYELOGRAM/URETERAL STENT PLACEMENT (Left Ureter)     Patient location during evaluation: PACU Anesthesia Type: General Level of consciousness: awake Pain management: pain level controlled Vital Signs Assessment: post-procedure vital signs reviewed and stable Respiratory status: spontaneous breathing Cardiovascular status: stable Postop Assessment: no apparent nausea or vomiting Anesthetic complications: no   No complications documented.  Last Vitals:  Vitals:   04/24/20 1630 04/24/20 1645  BP: (!) 155/78 (!) 145/67  Pulse: (!) 110 (!) 109  Resp: (!) 22 (!) 21  Temp:    SpO2: 98% 98%    Last Pain:  Vitals:   04/24/20 1645  TempSrc:   PainSc: Asleep                 Vila Dory

## 2020-04-25 ENCOUNTER — Other Ambulatory Visit: Payer: Self-pay | Admitting: Urology

## 2020-04-25 ENCOUNTER — Encounter (HOSPITAL_COMMUNITY): Payer: Self-pay | Admitting: Urology

## 2020-04-25 DIAGNOSIS — N200 Calculus of kidney: Secondary | ICD-10-CM | POA: Diagnosis not present

## 2020-04-25 DIAGNOSIS — A419 Sepsis, unspecified organism: Secondary | ICD-10-CM | POA: Diagnosis not present

## 2020-04-25 DIAGNOSIS — R6520 Severe sepsis without septic shock: Secondary | ICD-10-CM | POA: Diagnosis not present

## 2020-04-25 LAB — CBC
HCT: 31.7 % — ABNORMAL LOW (ref 36.0–46.0)
Hemoglobin: 10 g/dL — ABNORMAL LOW (ref 12.0–15.0)
MCH: 27.5 pg (ref 26.0–34.0)
MCHC: 31.5 g/dL (ref 30.0–36.0)
MCV: 87.1 fL (ref 80.0–100.0)
Platelets: 324 10*3/uL (ref 150–400)
RBC: 3.64 MIL/uL — ABNORMAL LOW (ref 3.87–5.11)
RDW: 15.3 % (ref 11.5–15.5)
WBC: 22.8 10*3/uL — ABNORMAL HIGH (ref 4.0–10.5)
nRBC: 0 % (ref 0.0–0.2)

## 2020-04-25 LAB — COMPREHENSIVE METABOLIC PANEL
ALT: 21 U/L (ref 0–44)
AST: 20 U/L (ref 15–41)
Albumin: 3.8 g/dL (ref 3.5–5.0)
Alkaline Phosphatase: 55 U/L (ref 38–126)
Anion gap: 10 (ref 5–15)
BUN: 21 mg/dL (ref 8–23)
CO2: 24 mmol/L (ref 22–32)
Calcium: 9.3 mg/dL (ref 8.9–10.3)
Chloride: 106 mmol/L (ref 98–111)
Creatinine, Ser: 1.18 mg/dL — ABNORMAL HIGH (ref 0.44–1.00)
GFR, Estimated: 48 mL/min — ABNORMAL LOW (ref >60)
Glucose, Bld: 167 mg/dL — ABNORMAL HIGH (ref 70–99)
Potassium: 4.2 mmol/L (ref 3.5–5.1)
Sodium: 140 mmol/L (ref 135–145)
Total Bilirubin: 0.4 mg/dL (ref 0.3–1.2)
Total Protein: 6.7 g/dL (ref 6.5–8.1)

## 2020-04-25 LAB — GLUCOSE, CAPILLARY
Glucose-Capillary: 122 mg/dL — ABNORMAL HIGH (ref 70–99)
Glucose-Capillary: 120 mg/dL — ABNORMAL HIGH (ref 70–99)
Glucose-Capillary: 151 mg/dL — ABNORMAL HIGH (ref 70–99)

## 2020-04-25 LAB — HEMOGLOBIN A1C
Hgb A1c MFr Bld: 6.6 % — ABNORMAL HIGH (ref 4.8–5.6)
Mean Plasma Glucose: 142.72 mg/dL

## 2020-04-25 LAB — MAGNESIUM: Magnesium: 1.7 mg/dL (ref 1.7–2.4)

## 2020-04-25 MED ORDER — AMLODIPINE BESYLATE 10 MG PO TABS
10.0000 mg | ORAL_TABLET | Freq: Every day | ORAL | Status: DC
  Administered 2020-04-25 – 2020-04-27 (×3): 10 mg via ORAL
  Filled 2020-04-25 (×3): qty 1

## 2020-04-25 MED ORDER — MORPHINE SULFATE (PF) 2 MG/ML IV SOLN: 2.0000 mg | INTRAVENOUS | Status: DC | PRN

## 2020-04-25 MED ORDER — CHLORHEXIDINE GLUCONATE CLOTH 2 % EX PADS
6.0000 | MEDICATED_PAD | Freq: Every day | CUTANEOUS | Status: DC
  Administered 2020-04-25 – 2020-04-27 (×3): 6 via TOPICAL

## 2020-04-25 NOTE — Progress Notes (Signed)
Diane Wilkerson  ASN:[id number] DOB: 08/03/1940 DOA: 04/24/2020 PCP: Willey Blade, MD    Brief Narrative:  80yo with a hx of HTN, pulmonary sarcoidosis, urolithiasis with basket stone extraction May 2021, and DM2 who presented to [facility] complaining of the acute onset of left-sided flank pain.  She was found to have a fever of 102.  CT abdomen/pelvis revealed a 4 mm distal left ureteral stone with obstruction and hydronephrosis as well as a 5 mm left lower pole stone. She was transferred to the ED and Rocephin was initiated. She was then taken urgently to the operating room where she underwent cystoscopy with left retrograde pyelogram and left ureteral stent placement.   Consultants:  Urology  Code Status: FULL CODE  Antimicrobials:  Rocephin 4/21 >  DVT prophylaxis: Lovenox  Subjective: Afebrile.  Vital signs stable.  Feeling much better overall today.  Denies abdominal pain chest pain or shortness of breath.  Feels weak in general.  Still with poor appetite at this time.  Low-grade nausea but no vomiting.  Assessment & Plan:  Obstructing left ureteral stone - left hydronephrosis Now status post surgical relief of obstruction - ongoing care per Urology -remaining kidney stone to be addressed in outpatient setting  SIRS due to acute urologic obstruction versus Sepsis related to Pyelonephritis Continue empiric antibiotic coverage -follow-up culture data from both blood and urine - SIRS/Sepsis physiology has resolved   Pulmonary sarcoidosis Appears this is well managed and that the patient does not require chronic steroid - no active pulmonary symptoms presently  HTN Blood pressure reasonably well controlled at this time  DM2 Treated with oral agents only at home - hold oral agents and administer SSI for now and monitor CBG  Right 1.6 cm adrenal adenoma Incidental finding on CT scan May 2021 -radiographically most consistent with a benign adenoma -CT this  admission actually notes bilateral adrenal adenomas but, as they are stable in size  Obesity - Body mass index is 34.75 kg/m.   Family Communication: Spoke with husband at bedside Status is: Inpatient  Remains inpatient appropriate because:Inpatient level of care appropriate due to severity of illness   Dispo: The patient is from: Home              Anticipated d/c is to: Home              Patient currently is not medically stable to d/c.   Difficult to place patient No   Objective: Blood pressure 140/79, pulse 69, temperature 98.3 F (36.8 C), temperature source Oral, resp. rate 14, height 5\' 2"  (1.575 m), weight 86.2 kg, SpO2 100 %.  Intake/Output Summary (Last 24 hours) at 04/25/2020 1048 Last data filed at 04/25/2020 0353 Gross per 24 hour  Intake 1100 ml  Output 550 ml  Net 550 ml   Filed Weights   04/24/20 1505  Weight: 86.2 kg    Examination: General: No acute respiratory distress Lungs: Clear to auscultation bilaterally -no wheezing Cardiovascular: Regular rate and rhythm without murmur gallop or rub normal S1 and S2 Abdomen: Overweight, soft, BS positive, no rebound Extremities: No significant cyanosis, clubbing, or edema bilateral lower extremities  CBC: Recent Labs  Lab 04/24/20 1357 04/25/20 0405  WBC 6.0 22.8*  NEUTROABS 5.6  --   HGB 11.8* 10.0*  HCT 37.8 31.7*  MCV 87.5 87.1  PLT 350 193   Basic Metabolic Panel: Recent Labs  Lab 04/24/20 1357 04/25/20 0405  NA 142 140  K 4.1 4.2  CL 108 106  CO2 20* 24  GLUCOSE 158* 167*  BUN 21 21  CREATININE 1.26* 1.18*  CALCIUM 10.0 9.3  MG  --  1.7   GFR: Estimated Creatinine Clearance: 40.7 mL/min (A) (by C-G formula based on SCr of 1.18 mg/dL (H)).  Liver Function Tests: Recent Labs  Lab 04/24/20 1357 04/25/20 0405  AST 36 20  ALT 26 21  ALKPHOS 80 55  BILITOT 1.0 0.4  PROT 7.8 6.7  ALBUMIN 4.4 3.8    Coagulation Profile: Recent Labs  Lab 04/24/20 1357  INR 1.0     HbA1C: Hgb A1c MFr Bld  Date/Time Value Ref Range Status  04/25/2020 04:05 AM 6.6 (H) 4.8 - 5.6 % Final    Comment:    (NOTE) Pre diabetes:          5.7%-6.4%  Diabetes:              >6.4%  Glycemic control for   <7.0% adults with diabetes   05/05/2019 09:34 PM 7.1 (H) 4.8 - 5.6 % Final    Comment:    (NOTE) Pre diabetes:          5.7%-6.4% Diabetes:              >6.4% Glycemic control for   <7.0% adults with diabetes     CBG: Recent Labs  Lab 04/24/20 1502 04/24/20 1613 04/24/20 2150 04/25/20 0738  GLUCAP 112* 124* 184* 122*    Recent Results (from the past 240 hour(s))  Resp Panel by RT-PCR (Flu A&B, Covid) Nasopharyngeal Swab     Status: None   Collection Time: 04/24/20  1:11 PM   Specimen: Nasopharyngeal Swab; Nasopharyngeal(NP) swabs in vial transport medium  Result Value Ref Range Status   SARS Coronavirus 2 by RT PCR NEGATIVE NEGATIVE Final    Comment: (NOTE) SARS-CoV-2 target nucleic acids are NOT DETECTED.  The SARS-CoV-2 RNA is generally detectable in upper respiratory specimens during the acute phase of infection. The lowest concentration of SARS-CoV-2 viral copies this assay can detect is 138 copies/mL. A negative result does not preclude SARS-Cov-2 infection and should not be used as the sole basis for treatment or other patient management decisions. A negative result may occur with  improper specimen collection/handling, submission of specimen other than nasopharyngeal swab, presence of viral mutation(s) within the areas targeted by this assay, and inadequate number of viral copies(<138 copies/mL). A negative result must be combined with clinical observations, patient history, and epidemiological information. The expected result is Negative.  Fact Sheet for Patients:  EntrepreneurPulse.com.au  Fact Sheet for Healthcare Providers:  IncredibleEmployment.be  This test is no t yet approved or cleared by  the [place] FDA and  has been authorized for detection and/or diagnosis of SARS-CoV-2 by FDA under an Emergency Use Authorization (EUA). This EUA will remain  in effect (meaning this test can be used) for the duration of the COVID-19 declaration under Section 564(b)(1) of the Act, 21 U.S.C.section 360bbb-3(b)(1), unless the authorization is terminated  or revoked sooner.       Influenza A by PCR NEGATIVE NEGATIVE Final   Influenza B by PCR NEGATIVE NEGATIVE Final    Comment: (NOTE) The Xpert Xpress SARS-CoV-2/FLU/RSV plus assay is intended as an aid in the diagnosis of influenza from Nasopharyngeal swab specimens and should not be used as a sole basis for treatment. Nasal washings and aspirates are unacceptable for Xpert Xpress SARS-CoV-2/FLU/RSV testing.  Fact Sheet for Patients: EntrepreneurPulse.com.au  Fact Sheet for Healthcare  Providers: IncredibleEmployment.be  This test is not yet approved or cleared by the [place] and has been authorized for detection and/or diagnosis of SARS-CoV-2 by FDA under an Emergency Use Authorization (EUA). This EUA will remain in effect (meaning this test can be used) for the duration of the COVID-19 declaration under Section 564(b)(1) of the Act, 21 U.S.C. section 360bbb-3(b)(1), unless the authorization is terminated or revoked.  Performed at [facility], [place] [address], [place], [place] [address]   Culture, blood (Routine x 2)     Status: None (Preliminary result)   Collection Time: 04/24/20  2:01 PM   Specimen: Site Not Specified; Blood  Result Value Ref Range Status   Specimen Description   Final    SITE NOT SPECIFIED Performed at [place] [address] [place], [place] [address]    Special Requests   Final    BOTTLES DRAWN AEROBIC AND ANAEROBIC Blood Culture adequate volume Performed at [place] [address], [place], [place] [address]    Culture   Final    NO GROWTH < 24 HOURS Performed at [place] [address] [place], [place] [address]    Report Status PENDING  Incomplete  Culture, blood (Routine x 2)     Status: None (Preliminary result)   Collection Time: 04/24/20  2:04 PM   Specimen: Site Not Specified; Blood  Result Value Ref Range Status   Specimen Description   Final    SITE NOT SPECIFIED Performed at [place] [address], [place], [place] [address]    Special Requests   Final    BOTTLES DRAWN AEROBIC AND ANAEROBIC Blood Culture adequate volume Performed at [place] [address] [place], [place] [address]    Culture   Final    NO GROWTH < 24 HOURS Performed at [place] [address] [place], [place] [address]    Report Status PENDING  Incomplete     Scheduled Meds: . Chlorhexidine Gluconate Cloth  6 each Topical Daily  . enoxaparin (LOVENOX) injection  40 mg Subcutaneous Q24H  . insulin aspart  0-5 Units Subcutaneous QHS  . insulin aspart  0-9 Units Subcutaneous TID WC  . senna  1 tablet Oral BID   Continuous Infusions: . cefTRIAXone (ROCEPHIN)  IV    . lactated ringers     And  . lactated ringers       LOS: 1 day   Cherene Altes, MD [facility] Office  [phone] Pager - Text Page per Shea Evans  If 7PM-7AM, please contact night-coverage per Amion 04/25/2020, 10:48 AM

## 2020-04-25 NOTE — Plan of Care (Signed)

## 2020-04-25 NOTE — Progress Notes (Signed)
Urology Inpatient Progress Report    1 Day Post-Op   Intv/Subj: Patient underwent urgent stent placement yesterday. She is feeling significantly better today.    Active Problems:   Sepsis ([place])  Current Facility-Administered Medications  Medication Dose Route Frequency Provider Last Rate Last Admin  . acetaminophen (TYLENOL) tablet 650 mg  650 mg Oral Q6H PRN Cherene Altes, MD      . bisacodyl (DULCOLAX) suppository 10 mg  10 mg Rectal Daily PRN Joette Catching T, MD      . cefTRIAXone (ROCEPHIN) 2 g in sodium chloride 0.9 % 100 mL IVPB  2 g Intravenous Q24H Joette Catching T, MD      . enoxaparin (LOVENOX) injection 40 mg  40 mg Subcutaneous Q24H Cherene Altes, MD   40 mg at 04/25/20 0547  . insulin aspart (novoLOG) injection 0-5 Units  0-5 Units Subcutaneous QHS Joette Catching T, MD      . insulin aspart (novoLOG) injection 0-9 Units  0-9 Units Subcutaneous TID WC Cherene Altes, MD      . lactated ringers bolus 1,000 mL  1,000 mL Intravenous Once Krista Blue L, PA-C       And  . lactated ringers bolus 1,000 mL  1,000 mL Intravenous Once Corena Herter, PA-C      . lactated ringers infusion   Intravenous Continuous Corena Herter, PA-C 150 mL/hr at 04/25/20 0122 New Bag at 04/25/20 0122  . morphine 4 MG/ML injection 2-4 mg  2-4 mg Intravenous Q3H PRN Cherene Altes, MD      . ondansetron [facility]) tablet 4 mg  4 mg Oral Q6H PRN Cherene Altes, MD       Or  . ondansetron [facility]) injection 4 mg  4 mg Intravenous Q6H PRN Cherene Altes, MD      . oxyCODONE (Oxy IR/ROXICODONE) immediate release tablet 5 mg  5 mg Oral Q4H PRN Cherene Altes, MD      . polyethylene glycol (MIRALAX / GLYCOLAX) packet 17 g  17 g Oral Daily PRN Cherene Altes, MD      . senna (SENOKOT) tablet 8.6 mg  1 tablet Oral BID Cherene Altes, MD   8.6 mg at 04/24/20 2301  . traMADol (ULTRAM) tablet 50 mg  50 mg Oral Q8H PRN Cherene Altes, MD   50 mg at 04/24/20  2302     Objective: Vital: Vitals:   04/24/20 1722 04/24/20 2154 04/25/20 0129 04/25/20 0350  BP: (!) 142/68 94/75 137/65 140/79  Pulse: (!) 113 97 80 69  Resp: 20 20 16 14   Temp: 99.3 F (37.4 C) 98.1 F (36.7 C) 98 F (36.7 C) 98.3 F (36.8 C)  TempSrc: Oral Oral Oral Oral  SpO2: 100% 100% 98% 100%  Weight:      Height:       I/Os: I/O last 3 completed shifts: In: 1100 [I.V.:1000; IV Piggyback:100] Out: 550 [Urine:550]  Physical Exam:  General: Patient is in no apparent distress Lungs: Normal respiratory effort, chest expands symmetrically. GI: The abdomen is soft and nontender  Foley: draining blood tinged urine  Ext: lower extremities symmetric  Lab Results: Recent Labs    04/24/20 1357 04/25/20 0405  WBC 6.0 22.8*  HGB 11.8* 10.0*  HCT 37.8 31.7*   Recent Labs    04/24/20 1357 04/25/20 0405  NA 142 140  K 4.1 4.2  CL 108 106  CO2 20* 24  GLUCOSE 158* 167*  BUN  21 21  CREATININE 1.26* 1.18*  CALCIUM 10.0 9.3   Recent Labs    04/24/20 1357  INR 1.0   No results for input(s): LABURIN in the last 72 hours. Results for orders placed or performed during the hospital encounter of 04/24/20  Resp Panel by RT-PCR (Flu A&B, Covid) Nasopharyngeal Swab     Status: None   Collection Time: 04/24/20  1:11 PM   Specimen: Nasopharyngeal Swab; Nasopharyngeal(NP) swabs in vial transport medium  Result Value Ref Range Status   SARS Coronavirus 2 by RT PCR NEGATIVE NEGATIVE Final    Comment: (NOTE) SARS-CoV-2 target nucleic acids are NOT DETECTED.  The SARS-CoV-2 RNA is generally detectable in upper respiratory specimens during the acute phase of infection. The lowest concentration of SARS-CoV-2 viral copies this assay can detect is 138 copies/mL. A negative result does not preclude SARS-Cov-2 infection and should not be used as the sole basis for treatment or other patient management decisions. A negative result may occur with  improper specimen  collection/handling, submission of specimen other than nasopharyngeal swab, presence of viral mutation(s) within the areas targeted by this assay, and inadequate number of viral copies(<138 copies/mL). A negative result must be combined with clinical observations, patient history, and epidemiological information. The expected result is Negative.  Fact Sheet for Patients:  EntrepreneurPulse.com.au  Fact Sheet for Healthcare Providers:  IncredibleEmployment.be  This test is no t yet approved or cleared by the [place] FDA and  has been authorized for detection and/or diagnosis of SARS-CoV-2 by FDA under an Emergency Use Authorization (EUA). This EUA will remain  in effect (meaning this test can be used) for the duration of the COVID-19 declaration under Section 564(b)(1) of the Act, 21 U.S.C.section 360bbb-3(b)(1), unless the authorization is terminated  or revoked sooner.       Influenza A by PCR NEGATIVE NEGATIVE Final   Influenza B by PCR NEGATIVE NEGATIVE Final    Comment: (NOTE) The Xpert Xpress SARS-CoV-2/FLU/RSV plus assay is intended as an aid in the diagnosis of influenza from Nasopharyngeal swab specimens and should not be used as a sole basis for treatment. Nasal washings and aspirates are unacceptable for Xpert Xpress SARS-CoV-2/FLU/RSV testing.  Fact Sheet for Patients: EntrepreneurPulse.com.au  Fact Sheet for Healthcare Providers: IncredibleEmployment.be  This test is not yet approved or cleared by the [place] FDA and has been authorized for detection and/or diagnosis of SARS-CoV-2 by FDA under an Emergency Use Authorization (EUA). This EUA will remain in effect (meaning this test can be used) for the duration of the COVID-19 declaration under Section 564(b)(1) of the Act, 21 U.S.C. section 360bbb-3(b)(1), unless the authorization is terminated or revoked.  Performed at [facility], [place] [address] [place], [place] [address]     Studies/Results: DG Chest Port 1 View  Result Date: 04/24/2020 CLINICAL DATA:  Questionable sepsis. EXAM: PORTABLE CHEST 1 VIEW COMPARISON:  03/05/2020. FINDINGS: Mediastinum and hilar structures normal. Heart size normal. Low lung volumes with mild bibasilar atelectasis. No pleural effusion or pneumothorax. IMPRESSION: Low lung volumes with mild bibasilar atelectasis. Electronically Signed   By: Marcello Moores  Register   On: 04/24/2020 14:10   DG C-Arm 1-60 Min-No Report  Result Date: 04/24/2020 Fluoroscopy was utilized by the requesting physician.  No radiographic interpretation.   CT Renal Stone Study  Result Date: 04/24/2020 CLINICAL DATA:  Flank pain.  Fever. EXAM: CT ABDOMEN AND PELVIS WITHOUT CONTRAST TECHNIQUE: Multidetector CT imaging of the abdomen and pelvis was performed following the  standard protocol without IV contrast. COMPARISON:  CT urogram earlier the same day on 04/24/2020 at [facility]. 05/06/2019. FINDINGS: Lower chest: 5 mm right lower lobe pulmonary nodule on image 1/series 4 has been incompletely visualized. 2 mm right lower lobe nodule visible in the paraspinal lung on 18/4. 4 mm left lower lobe nodule identified in the left lower lobe on image 2/4. The 5 mm right lower lobe and 4 mm left lower lobe pulmonary nodules are stable since a CT chest 05/06/2019 consistent with benign etiology. 2 mm right lower lobe nodule seen today was obscured by atelectasis previously but is also almost assuredly benign. Hepatobiliary: The liver shows diffusely decreased attenuation suggesting fat deposition. No focal abnormality in the liver on this study without intravenous contrast. There is no evidence for gallstones, gallbladder wall thickening, or pericholecystic fluid. No intrahepatic or extrahepatic biliary dilation. Pancreas: No focal mass lesion. No dilatation of the main duct. No intraparenchymal  cyst. No peripancreatic edema. Spleen: 2.2 cm hypoattenuating lesion in the inferior spleen is not substantially changed since 05/06/2019 suggesting benign etiology. Lesion approaches water density and is likely a cyst or pseudocyst. Adrenals/Urinary Tract: Stable 2.8 cm right adrenal nodule compatible with adenoma. 1.5 cm left adrenal nodule also compatible with adenoma. Stable 3.6 cm water density lesion upper pole right kidney compatible with a cyst. 2.5 cm water density lesion in the upper interpolar left kidney also unchanged compatible with a cyst. Tiny low-density lesion towards the upper pole left kidney is too small to characterize. No stones are seen in the right kidney or ureter. 5 mm stone is noted in the lower pole left kidney. There is mild left hydronephrosis with mild left hydroureter. Ureteral obstruction is due to a 4 x 4 x 2 mm stone in the distal left ureter, best seen on coronal image 99/5. Of note, there are numerous phleboliths in both gonadal veins coursing along the track of both ureters. Insert no bladder stones Stomach/Bowel: Small hiatal hernia. Stomach otherwise unremarkable. Duodenum is normally positioned as is the ligament of Treitz. No small bowel wall thickening. No small bowel dilatation. The terminal ileum is normal. The appendix is normal. No gross colonic mass. No colonic wall thickening. Diverticuli are seen scattered along the entire length of the colon without CT findings of diverticulitis. Vascular/Lymphatic: There is abdominal aortic atherosclerosis without aneurysm. There is no gastrohepatic or hepatoduodenal ligament lymphadenopathy. No retroperitoneal or mesenteric lymphadenopathy. No pelvic sidewall lymphadenopathy. Reproductive: Multiple uterine fibroids evident. There is no adnexal mass. Other: No intraperitoneal free fluid. Musculoskeletal: No worrisome lytic or sclerotic osseous abnormality. IMPRESSION: 1. 4 x 4 x 2 mm distal left ureteral stone causes mild left  hydroureteronephrosis. Of note, there are numerous phleboliths in both gonadal veins coursing along the track of the ureters. 2. 5 mm nonobstructing stone lower pole left kidney. No evidence for right renal or ureteral stones. 3. Bilateral renal cysts. 4. Hepatic steatosis. 5. Stable bilateral adrenal adenomas. 6. Small hiatal hernia. 7. Aortic Atherosclerosis (ICD10-I70.0). Electronically Signed   By: Misty Stanley M.D.   On: 04/24/2020 14:55    Assessment: 80 yo woman with 4 mm left obstructing infected ureteral calculus POD1 s/p left ureteral stent placement.  -continue foley x 24 hour for maximal drainage -repeat CBC tomorrow  -follow up urine cx and tailor antibiotics to sensitivities -pt to be scheduled for definitive management of stone in 2-3 weeks    Jacalyn Lefevre, MD Urology 04/25/2020, 7:55 AM

## 2020-04-25 NOTE — Plan of Care (Signed)
  Problem: Education: Goal: Knowledge of General Education information will improve Description: Including pain rating scale, medication(s)/side effects and non-pharmacologic comfort measures 04/25/2020 0031 by Tula Nakayama, RN Outcome: Progressing 04/25/2020 0031 by Tula Nakayama, RN Outcome: Progressing   Problem: Health Behavior/Discharge Planning: Goal: Ability to manage health-related needs will improve 04/25/2020 0031 by Tula Nakayama, RN Outcome: Progressing 04/25/2020 0031 by Glenna Fellows D, RN Outcome: Progressing   Problem: Clinical Measurements: Goal: Ability to maintain clinical measurements within normal limits will improve 04/25/2020 0031 by Tula Nakayama, RN Outcome: Progressing 04/25/2020 0031 by Glenna Fellows D, RN Outcome: Progressing Goal: Will remain free from infection 04/25/2020 0031 by Glenna Fellows D, RN Outcome: Progressing 04/25/2020 0031 by Glenna Fellows D, RN Outcome: Progressing Goal: Diagnostic test results will improve 04/25/2020 0031 by Tula Nakayama, RN Outcome: Progressing 04/25/2020 0031 by Glenna Fellows D, RN Outcome: Progressing Goal: Respiratory complications will improve 04/25/2020 0031 by Glenna Fellows D, RN Outcome: Progressing 04/25/2020 0031 by Glenna Fellows D, RN Outcome: Progressing Goal: Cardiovascular complication will be avoided 04/25/2020 0031 by Glenna Fellows D, RN Outcome: Progressing 04/25/2020 0031 by Tula Nakayama, RN Outcome: Progressing   Problem: Activity: Goal: Risk for activity intolerance will decrease 04/25/2020 0031 by Glenna Fellows D, RN Outcome: Progressing 04/25/2020 0031 by Glenna Fellows D, RN Outcome: Progressing   Problem: Nutrition: Goal: Adequate nutrition will be maintained 04/25/2020 0031 by Tula Nakayama, RN Outcome: Progressing 04/25/2020 0031 by Glenna Fellows D, RN Outcome: Progressing   Problem: Coping: Goal: Level of anxiety will decrease 04/25/2020 0031 by Glenna Fellows D, RN Outcome: Progressing 04/25/2020 0031 by Glenna Fellows D, RN Outcome: Progressing   Problem: Elimination: Goal: Will not experience complications related to bowel motility 04/25/2020 0031 by Tula Nakayama, RN Outcome: Progressing 04/25/2020 0031 by Glenna Fellows D, RN Outcome: Progressing Goal: Will not experience complications related to urinary retention 04/25/2020 0031 by Tula Nakayama, RN Outcome: Progressing 04/25/2020 0031 by Tula Nakayama, RN Outcome: Progressing   Problem: Pain Managment: Goal: General experience of comfort will improve 04/25/2020 0031 by Tula Nakayama, RN Outcome: Progressing 04/25/2020 0031 by Glenna Fellows D, RN Outcome: Progressing   Problem: Safety: Goal: Ability to remain free from injury will improve 04/25/2020 0031 by Tula Nakayama, RN Outcome: Progressing 04/25/2020 0031 by Glenna Fellows D, RN Outcome: Progressing   Problem: Skin Integrity: Goal: Risk for impaired skin integrity will decrease 04/25/2020 0031 by Tula Nakayama, RN Outcome: Progressing 04/25/2020 0031 by Glenna Fellows D, RN Outcome: Progressing   Problem: Education: Goal: Required Educational Video(s) Outcome: Progressing   Problem: Clinical Measurements: Goal: Ability to maintain clinical measurements within normal limits will improve Outcome: Progressing Goal: Postoperative complications will be avoided or minimized Outcome: Progressing   Problem: Skin Integrity: Goal: Demonstration of wound healing without infection will improve Outcome: Progressing

## 2020-04-26 DIAGNOSIS — A419 Sepsis, unspecified organism: Secondary | ICD-10-CM | POA: Diagnosis not present

## 2020-04-26 DIAGNOSIS — R6520 Severe sepsis without septic shock: Secondary | ICD-10-CM | POA: Diagnosis not present

## 2020-04-26 DIAGNOSIS — N200 Calculus of kidney: Secondary | ICD-10-CM | POA: Diagnosis not present

## 2020-04-26 LAB — BASIC METABOLIC PANEL
Anion gap: 13 (ref 5–15)
BUN: 24 mg/dL — ABNORMAL HIGH (ref 8–23)
CO2: 25 mmol/L (ref 22–32)
Calcium: 9.4 mg/dL (ref 8.9–10.3)
Chloride: 105 mmol/L (ref 98–111)
Creatinine, Ser: 1.03 mg/dL — ABNORMAL HIGH (ref 0.44–1.00)
GFR, Estimated: 56 mL/min — ABNORMAL LOW (ref >60)
Glucose, Bld: 113 mg/dL — ABNORMAL HIGH (ref 70–99)
Potassium: 3.7 mmol/L (ref 3.5–5.1)
Sodium: 143 mmol/L (ref 135–145)

## 2020-04-26 LAB — GLUCOSE, CAPILLARY
Glucose-Capillary: 119 mg/dL — ABNORMAL HIGH (ref 70–99)
Glucose-Capillary: 124 mg/dL — ABNORMAL HIGH (ref 70–99)
Glucose-Capillary: 128 mg/dL — ABNORMAL HIGH (ref 70–99)
Glucose-Capillary: 147 mg/dL — ABNORMAL HIGH (ref 70–99)

## 2020-04-26 LAB — CBC
HCT: 32.5 % — ABNORMAL LOW (ref 36.0–46.0)
Hemoglobin: 10 g/dL — ABNORMAL LOW (ref 12.0–15.0)
MCH: 27.2 pg (ref 26.0–34.0)
MCHC: 30.8 g/dL (ref 30.0–36.0)
MCV: 88.3 fL (ref 80.0–100.0)
Platelets: 316 10*3/uL (ref 150–400)
RBC: 3.68 MIL/uL — ABNORMAL LOW (ref 3.87–5.11)
RDW: 15.6 % — ABNORMAL HIGH (ref 11.5–15.5)
WBC: 14.3 10*3/uL — ABNORMAL HIGH (ref 4.0–10.5)
nRBC: 0 % (ref 0.0–0.2)

## 2020-04-26 LAB — MAGNESIUM: Magnesium: 1.9 mg/dL (ref 1.7–2.4)

## 2020-04-26 NOTE — Plan of Care (Signed)

## 2020-04-26 NOTE — Progress Notes (Signed)
Diane Wilkerson  YIR:[id number] DOB: July 22, 1940 DOA: 04/24/2020 PCP: Willey Blade, MD    Brief Narrative:  80yo with a hx of HTN, pulmonary sarcoidosis, urolithiasis with basket stone extraction May 2021, and DM2 who presented to [facility] complaining of the acute onset of left-sided flank pain.  She was found to have a fever of 102.  CT abdomen/pelvis revealed a 4 mm distal left ureteral stone with obstruction and hydronephrosis as well as a 5 mm left lower pole stone. She was transferred to the ED and Rocephin was initiated. She was then taken urgently to the operating room where she underwent cystoscopy with left retrograde pyelogram and left ureteral stent placement.   Consultants:  Urology  Code Status: FULL CODE  Antimicrobials:  Rocephin 4/21 >  DVT prophylaxis: Lovenox  Subjective: Afebrile.  Vital signs stable.  WBC markedly improved at 14.3 today.  Renal function continues to improve with creatinine 1.03 today.  Oral intake normalizing.  Denies shortness of breath or chest pain.  Some modest abdominal pain today.  Foley catheter remains in place at this time.  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   Family Communication: Spoke with husband at bedside Status is: Inpatient  Remains inpatient appropriate because:Inpatient level of care appropriate due to severity of illness   Dispo: The patient is from: Home              Anticipated d/c is to: Home              Patient currently is not medically stable to d/c.   Difficult to place patient No   Objective: Blood pressure 136/69, pulse 80, temperature 98.2 F (36.8 C), temperature source Oral, resp. rate 20, height 5\' 2"  (1.575 m), weight 86.2 kg, SpO2 99 %.  Intake/Output Summary (Last 24 hours) at 04/26/2020 1016 Last data filed at 04/25/2020 1800 Gross per 24 hour  Intake --  Output 600 ml  Net -600 ml   Filed Weights   04/24/20 1505  Weight: 86.2 kg    Examination: General: No acute respiratory distress Lungs: Clear to auscultation bilaterally without wheezing Cardiovascular: Regular rate and rhythm without murmur Abdomen: Overweight, soft, BS positive, no rebound Extremities: No significant edema bilateral lower extremities  CBC: Recent Labs  Lab 04/24/20 1357 04/25/20 0405 04/26/20 0339  WBC 6.0 22.8* 14.3*  NEUTROABS 5.6  --   --   HGB 11.8* 10.0* 10.0*  HCT 37.8 31.7* 32.5*  MCV 87.5 87.1 88.3  PLT 350 324  956   Basic Metabolic Panel: Recent Labs  Lab 04/24/20 1357 04/25/20 0405 04/26/20 0339  NA 142 140 143  K 4.1 4.2 3.7  CL 108 106 105  CO2 20* 24 25  GLUCOSE 158* 167* 113*  BUN 21 21 24*  CREATININE 1.26* 1.18* 1.03*  CALCIUM 10.0 9.3 9.4  MG  --  1.7 1.9   GFR: Estimated Creatinine Clearance: 46.6 mL/min (A) (by C-G formula based on SCr of 1.03 mg/dL (H)).  Liver Function Tests: Recent Labs  Lab 04/24/20 1357  04/25/20 0405  AST 36 20  ALT 26 21  ALKPHOS 80 55  BILITOT 1.0 0.4  PROT 7.8 6.7  ALBUMIN 4.4 3.8    Coagulation Profile: Recent Labs  Lab 04/24/20 1357  INR 1.0    HbA1C: Hgb A1c MFr Bld  Date/Time Value Ref Range Status  04/25/2020 04:05 AM 6.6 (H) 4.8 - 5.6 % Final    Comment:    (NOTE) Pre diabetes:          5.7%-6.4%  Diabetes:              >6.4%  Glycemic control for   <7.0% adults with diabetes   05/05/2019 09:34 PM 7.1 (H) 4.8 - 5.6 % Final    Comment:    (NOTE) Pre diabetes:          5.7%-6.4% Diabetes:              >6.4% Glycemic control for   <7.0% adults with diabetes     CBG: Recent Labs  Lab 04/24/20 2150 04/25/20 0738 04/25/20 1148 04/25/20 1638 04/26/20 0811  GLUCAP 184* 122* 120* 151* 119*    Recent Results (from the past 240 hour(s))  Resp Panel by RT-PCR (Flu A&B, Covid) Nasopharyngeal Swab     Status: None   Collection Time: 04/24/20  1:11 PM   Specimen: Nasopharyngeal Swab; Nasopharyngeal(NP) swabs in vial transport medium  Result Value Ref Range Status   SARS Coronavirus 2 by RT PCR NEGATIVE NEGATIVE Final    Comment: (NOTE) SARS-CoV-2 target nucleic acids are NOT DETECTED.  The SARS-CoV-2 RNA is generally detectable in upper respiratory specimens during the acute phase of infection. The lowest concentration of SARS-CoV-2 viral copies this assay can detect is 138 copies/mL. A negative result does not preclude SARS-Cov-2 infection and should not be used as the sole basis for treatment or other patient management decisions. A negative result may occur with  improper specimen collection/handling, submission of specimen other than nasopharyngeal swab, presence of viral mutation(s) within the areas targeted by this assay, and inadequate number of viral copies(<138 copies/mL). A negative result must be combined with clinical observations, patient history, and epidemiological information. The expected result is Negative.  Fact  Sheet for Patients:  EntrepreneurPulse.com.au  Fact Sheet for Healthcare Providers:  IncredibleEmployment.be  This test is no t yet approved or cleared by the [place] FDA and  has been authorized for detection and/or diagnosis of SARS-CoV-2 by FDA under an Emergency Use Authorization (EUA). This EUA will remain  in effect (meaning this test can be used) for the duration of the COVID-19 declaration under Section 564(b)(1) of the Act, 21 U.S.C.section 360bbb-3(b)(1), unless the authorization is terminated  or revoked sooner.       Influenza A by PCR NEGATIVE NEGATIVE Final   Influenza B by PCR NEGATIVE NEGATIVE Final    Comment: (NOTE) The Xpert Xpress SARS-CoV-2/FLU/RSV plus assay is intended as an aid in the diagnosis of influenza  from Nasopharyngeal swab specimens and should not be used as a sole basis for treatment. Nasal washings and aspirates are unacceptable for Xpert Xpress SARS-CoV-2/FLU/RSV testing.  Fact Sheet for Patients: EntrepreneurPulse.com.au  Fact Sheet for Healthcare Providers: IncredibleEmployment.be  This test is not yet approved or cleared by the [place] FDA and has been authorized for detection and/or diagnosis of SARS-CoV-2 by FDA under an Emergency Use Authorization (EUA). This EUA will remain in effect (meaning this test can be used) for the duration of the COVID-19 declaration under Section 564(b)(1) of the Act, 21 U.S.C. section 360bbb-3(b)(1), unless the authorization is terminated or revoked.  Performed at [facility], [place] [address], [place], [place] [address]   Culture, blood (Routine x 2)     Status: None (Preliminary result)   Collection Time: 04/24/20  2:01 PM   Specimen: Site Not Specified; Blood  Result Value Ref Range Status   Specimen Description   Final    SITE NOT SPECIFIED Performed at [place]  [address] [place], [place] [address]    Special Requests   Final    BOTTLES DRAWN AEROBIC AND ANAEROBIC Blood Culture adequate volume Performed at [place] [address], [place], [place] [address]    Culture   Final    NO GROWTH 2 DAYS Performed at [place] [address], [place], [place] [address]    Report Status PENDING  Incomplete  Culture, blood (Routine x 2)     Status: None (Preliminary result)   Collection Time: 04/24/20  2:04 PM   Specimen: Site Not Specified; Blood  Result Value Ref Range Status   Specimen Description   Final    SITE NOT SPECIFIED Performed at [place] [address], [place], [place] [address]    Special Requests   Final    BOTTLES DRAWN AEROBIC AND ANAEROBIC Blood Culture adequate volume Performed at [facility] [address], [place], [place] [address]    Culture   Final    NO GROWTH 2 DAYS Performed at [facility] [address], [place], [place] [address]    Report Status PENDING  Incomplete  Urine culture     Status: Abnormal (Preliminary result)   Collection Time: 04/24/20  3:41 PM   Specimen: Kidney; Urine  Result Value Ref Range Status   Specimen Description   Final    KIDNEY LT RENAL CYTOSCOPY Performed at [facility] [address] [place], [place] [address]    Special Requests   Final    NONE Performed at [facility], [place] [address], [place], [place] [address]    Culture (A)  Final    60,000 COLONIES/mL GRAM NEGATIVE RODS CULTURE REINCUBATED FOR BETTER GROWTH SUSCEPTIBILITIES TO FOLLOW Performed at [facility], [place] [address], [place],  [address]    Report Status PENDING  Incomplete     Scheduled Meds: . amLODipine  10 mg Oral Daily  . Chlorhexidine Gluconate Cloth  6 each Topical Daily  . enoxaparin (LOVENOX) injection  40 mg Subcutaneous Q24H  . insulin aspart  0-5 Units  Subcutaneous QHS  . insulin aspart  0-9 Units Subcutaneous TID WC  . senna  1 tablet Oral BID   Continuous Infusions: . cefTRIAXone (ROCEPHIN)  IV 2 g (04/25/20 1455)     LOS: 2 days   Cherene Altes, MD [facility] Office  [phone] Pager -  Text Page per Shea Evans  If 7PM-7AM, please contact night-coverage per Amion 04/26/2020, 10:16 AM

## 2020-04-27 DIAGNOSIS — R6520 Severe sepsis without septic shock: Secondary | ICD-10-CM | POA: Diagnosis not present

## 2020-04-27 DIAGNOSIS — N200 Calculus of kidney: Secondary | ICD-10-CM | POA: Diagnosis not present

## 2020-04-27 DIAGNOSIS — A419 Sepsis, unspecified organism: Secondary | ICD-10-CM | POA: Diagnosis not present

## 2020-04-27 LAB — URINE CULTURE: Culture: 60000 — AB

## 2020-04-27 LAB — BASIC METABOLIC PANEL
Anion gap: 8 (ref 5–15)
BUN: 21 mg/dL (ref 8–23)
CO2: 28 mmol/L (ref 22–32)
Calcium: 9.6 mg/dL (ref 8.9–10.3)
Chloride: 105 mmol/L (ref 98–111)
Creatinine, Ser: 1.02 mg/dL — ABNORMAL HIGH (ref 0.44–1.00)
GFR, Estimated: 57 mL/min — ABNORMAL LOW (ref >60)
Glucose, Bld: 129 mg/dL — ABNORMAL HIGH (ref 70–99)
Potassium: 3.8 mmol/L (ref 3.5–5.1)
Sodium: 141 mmol/L (ref 135–145)

## 2020-04-27 LAB — CBC
HCT: 30.8 % — ABNORMAL LOW (ref 36.0–46.0)
Hemoglobin: 9.8 g/dL — ABNORMAL LOW (ref 12.0–15.0)
MCH: 27.5 pg (ref 26.0–34.0)
MCHC: 31.8 g/dL (ref 30.0–36.0)
MCV: 86.3 fL (ref 80.0–100.0)
Platelets: 312 10*3/uL (ref 150–400)
RBC: 3.57 MIL/uL — ABNORMAL LOW (ref 3.87–5.11)
RDW: 15 % (ref 11.5–15.5)
WBC: 8 10*3/uL (ref 4.0–10.5)
nRBC: 0 % (ref 0.0–0.2)

## 2020-04-27 LAB — GLUCOSE, CAPILLARY
Glucose-Capillary: 138 mg/dL — ABNORMAL HIGH (ref 70–99)
Glucose-Capillary: 122 mg/dL — ABNORMAL HIGH (ref 70–99)

## 2020-04-27 MED ORDER — SODIUM CHLORIDE 0.9 % IV SOLN
2.0000 g | Freq: Every day | INTRAVENOUS | Status: DC
  Administered 2020-04-27: 2 g via INTRAVENOUS
  Filled 2020-04-27: qty 2

## 2020-04-27 MED ORDER — ACETAMINOPHEN 325 MG PO TABS: 650.0000 mg | ORAL_TABLET | Freq: Four times a day (QID) | ORAL | Status: AC | PRN

## 2020-04-27 MED ORDER — CEFUROXIME AXETIL 500 MG PO TABS: 500.0000 mg | ORAL_TABLET | Freq: Two times a day (BID) | ORAL | 0 refills | Status: AC

## 2020-04-27 NOTE — Discharge Instructions (Signed)
Activity:  You are encouraged to ambulate frequently (about every hour during waking hours) to help prevent blood clots from forming in your legs or lungs.     Diet: You should advance your diet as instructed by your physician.  It will be normal to have some bloating, nausea, and abdominal discomfort intermittently.   Prescriptions:  You may take extra strength Tylenol as needed for pain.     What to call us about: You should call the office [phone]) if you develop fever > 101 or develop persistent vomiting.     You have a left ureteral stent in place.  This is temporary and must be exchanged or removed in the coming months.  Follow-up in the office to schedule definitive treatment of your ureteral stones.    Kidney Stones  Kidney stones are solid, rock-like deposits that form inside of the kidneys. The kidneys are a pair of organs that make urine. A kidney stone may form in a kidney and move into other parts of the urinary tract, including the tubes that connect the kidneys to the bladder (ureters), the bladder, and the tube that carries urine out of the body (urethra). As the stone moves through these areas, it can cause intense pain and block the flow of urine. Kidney stones are created when high levels of certain minerals are found in the urine. The stones are usually passed out of the body through urination, but in some cases, medical treatment may be needed to remove them. What are the causes? Kidney stones may be caused by:  A condition in which certain glands produce too much parathyroid hormone (primary hyperparathyroidism), which causes too much calcium buildup in the blood.  A buildup of uric acid crystals in the bladder (hyperuricosuria). Uric acid is a chemical that the body produces when you eat certain foods. It usually exits the body in the urine.  Narrowing (stricture) of one or both of the ureters.  A kidney blockage that is present at birth (congenital  obstruction).  Past surgery on the kidney or the ureters, such as gastric bypass surgery. What increases the risk? The following factors may make you more likely to develop this condition:  Having had a kidney stone in the past.  Having a family history of kidney stones.  Not drinking enough water.  Eating a diet that is high in protein, salt (sodium), or sugar.  Being overweight or obese. What are the signs or symptoms? Symptoms of a kidney stone may include:  Pain in the side of the abdomen, right below the ribs (flank pain). Pain usually spreads (radiates) to the groin.  Needing to urinate frequently or urgently.  Painful urination.  Blood in the urine (hematuria).  Nausea.  Vomiting.  Fever and chills. How is this diagnosed? This condition may be diagnosed based on:  Your symptoms and medical history.  A physical exam.  Blood tests.  Urine tests. These may be done before and after the stone passes out of your body through urination.  Imaging tests, such as a CT scan, abdominal X-ray, or ultrasound.  A procedure to examine the inside of the bladder (cystoscopy). How is this treated? Treatment for kidney stones depends on the size, location, and makeup of the stones. Kidney stones will often pass out of the body through urination. You may need to:  Increase your fluid intake to help pass the stone. In some cases, you may be given fluids through an IV and may need to be  monitored at the hospital.  Take medicine for pain.  Make changes in your diet to help prevent kidney stones from coming back. Sometimes, medical procedures are needed to remove a kidney stone. This may involve:  A procedure to break up kidney stones using: ? A focused beam of light (laser therapy). ? Shock waves (extracorporeal shock wave lithotripsy).  Surgery to remove kidney stones. This may be needed if you have severe pain or have stones that block your urinary tract. Follow these  instructions at home: Medicines  Take over-the-counter and prescription medicines only as told by your health care provider.  Ask your health care provider if the medicine prescribed to you requires you to avoid driving or using heavy machinery. Eating and drinking  Drink enough fluid to keep your urine pale yellow. You may be instructed to drink at least 8-10 glasses of water each day. This will help you pass the kidney stone.  If directed, change your diet. This may include: ? Limiting how much sodium you eat. ? Eating more fruits and vegetables. ? Limiting how much animal protein--such as red meat, poultry, fish, and eggs--you eat.  Follow instructions from your health care provider about eating or drinking restrictions. General instructions  Collect urine samples as told by your health care provider. You may need to collect a urine sample: ? 24 hours after you pass the stone. ? 8-12 weeks after passing the kidney stone, and every 6-12 months after that.  Strain your urine every time you urinate, for as long as directed. Use the strainer that your health care provider recommends.  Do not throw out the kidney stone after passing it. Keep the stone so it can be tested by your health care provider. Testing the makeup of your kidney stone may help prevent you from getting kidney stones in the future.  Keep all follow-up visits as told by your health care provider. This is important. You may need follow-up X-rays or ultrasounds to make sure that your stone has passed. How is this prevented? To prevent another kidney stone:  Drink enough fluid to keep your urine pale yellow. This is the best way to prevent kidney stones.  Eat a healthy diet and follow recommendations from your health care provider about foods to avoid. You may be instructed to eat a low-protein diet. Recommendations vary depending on the type of kidney stone that you have.  Maintain a healthy weight.   Where to find  more information  [place] ([facility]): www.[email or web address].[email or web address]): www.urologyhealth.org Contact a health care provider if:  You have pain that gets worse or does not get better with medicine. Get help right away if:  You have a fever or chills.  You develop severe pain.  You develop new abdominal pain.  You faint.  You are unable to urinate. Summary  Kidney stones are solid, rock-like deposits that form inside of the kidneys.  Kidney stones can cause nausea, vomiting, blood in the urine, abdominal pain, and the urge to urinate frequently.  Treatment for kidney stones depends on the size, location, and makeup of the stones. Kidney stones will often pass out of the body through urination.  Kidney stones can be prevented by drinking enough fluids, eating a healthy diet, and maintaining a healthy weight. This information is not intended to replace advice given to you by your health care provider. Make sure you discuss any questions you have with your health care provider. Document Revised: 05/09/2018 Document  Reviewed: 05/09/2018 Elsevier Patient Education  [place].

## 2020-04-27 NOTE — Discharge Summary (Signed)
DISCHARGE SUMMARY  MARTHENA WHITMYER  MR#: [id number]  DOB:06/20/1940  Date of Admission: 04/24/2020 Date of Discharge: 04/27/2020  Attending Physician:Bienvenido Proehl Hennie Duos, MD  Patient's QJJ:HERDEYC, Joelene Millin, MD  Consults: Urology   Disposition: D/C home   Follow-up Appts:  Follow-up Information    Schedule an appointment as soon as possible for a visit with [place].   Contact information: [place]       Willey Blade, MD Follow up in 2 week(s).   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   Initial presentation: 79yowith a hx of HTN, pulmonary sarcoidosis, urolithiasis with basket stone extraction May 2021, and DM2 [facility] onset of left-sided flank pain. She was found to have a fever of 102. CT abdomen/pelvis revealed a 4 mm distal left ureteral stone with obstruction and hydronephrosis as well as a 5 mm left lower pole stone. She was transferred to the ED and Rocephin was initiated. She was then taken urgently to the operating room where she underwent cystoscopy with left retrograde pyelogram and left ureteral stent placement.   Hospital Course:  Obstructing left ureteral stone-left hydronephrosis status post surgical relief of obstruction w/ stent placement4/21 - ongoing care per Urologyin outpatient follow-up - remaining stone to be addressed in outpatient setting - Foley catheter removed day prior to discharge with patient urinating freely  Sepsis due to acute urologic obstructionand  pyelonephritis - E coli  SIRS/Sepsis physiology has resolved - transitioned to oral antibiotics on date of discharge  Pulmonary sarcoidosis well managed / patient does not require chronic steroid- no active pulmonary symptoms during this admission  HTN Blood pressure well controlled   DM2 Treated with oral agents only at home- CBG well controlled during this admission  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   Allergies as of 04/27/2020   No Known Allergies     Medication List    TAKE these medications   acetaminophen 325 MG tablet Commonly known as: TYLENOL Take 2 tablets (650 mg total) by mouth every 6 (six) hours as needed for mild pain, fever or headache.   amLODipine 10 MG tablet Commonly known as: NORVASC Take 10 mg by mouth daily.   cefUROXime 500 MG tablet Commonly known as: CEFTIN Take 1 tablet (500 mg total) by mouth 2 (two) times daily with a meal for 4 days. Start taking on: April 28, 2020   cholecalciferol 25 MCG (1000 UNIT) tablet Commonly known as: VITAMIN D3 Take 1,000 Units by mouth daily.   CO Q 10 PO Take 1 tablet by mouth daily.   glipizide-metformin 2.5-250 MG tablet Commonly known as: METAGLIP Take 1 tablet by mouth 2 (two) times daily.   VITAMIN B 12 PO Take 1 tablet by mouth daily.       Day of Discharge BP 136/78 (BP Location: Left Arm)   Pulse 75   Temp 98.2 F (36.8 C) (Oral)   Resp 18   Ht  5\' 2"  (1.575 m)   Wt 86.2 kg   LMP  (LMP Unknown) Comment: postmenopausal  SpO2 97%   BMI 34.75 kg/m   Physical Exam: General: No acute respiratory distress Lungs: Clear to auscultation bilaterally without wheezes or crackles Cardiovascular: Regular rate and rhythm without murmur gallop or rub normal S1 and  S2 Abdomen: Nontender, nondistended, soft, bowel sounds positive, no rebound, no ascites, no appreciable mass Extremities: No significant cyanosis, clubbing, or edema bilateral lower extremities  Basic Metabolic Panel: Recent Labs  Lab 04/24/20 1357 04/25/20 0405 04/26/20 0339 04/27/20 0425  NA 142 140 143 141  K 4.1 4.2 3.7 3.8  CL 108 106 105 105  CO2 20* 24 25 28   GLUCOSE 158* 167* 113* 129*  BUN 21 21 24* 21  CREATININE 1.26* 1.18* 1.03* 1.02*  CALCIUM 10.0 9.3 9.4 9.6  MG  --  1.7 1.9  --     Liver Function Tests: Recent Labs  Lab 04/24/20 1357 04/25/20 0405  AST 36 20  ALT 26 21  ALKPHOS 80 55  BILITOT 1.0 0.4  PROT 7.8 6.7  ALBUMIN 4.4 3.8   Coags: Recent Labs  Lab 04/24/20 1357  INR 1.0   CBC: Recent Labs  Lab 04/24/20 1357 04/25/20 0405 04/26/20 0339 04/27/20 0425  WBC 6.0 22.8* 14.3* 8.0  NEUTROABS 5.6  --   --   --   HGB 11.8* 10.0* 10.0* 9.8*  HCT 37.8 31.7* 32.5* 30.8*  MCV 87.5 87.1 88.3 86.3  PLT 350 324 316 312    CBG: Recent Labs  Lab 04/26/20 1153 04/26/20 1706 04/26/20 2047 04/27/20 0743 04/27/20 1200  GLUCAP 124* 128* 147* 138* 122*    Recent Results (from the past 240 hour(s))  Resp Panel by RT-PCR (Flu A&B, Covid) Nasopharyngeal Swab     Status: None   Collection Time: 04/24/20  1:11 PM   Specimen: Nasopharyngeal Swab; Nasopharyngeal(NP) swabs in vial transport medium  Result Value Ref Range Status   SARS Coronavirus 2 by RT PCR NEGATIVE NEGATIVE Final    Comment: (NOTE) SARS-CoV-2 target nucleic acids are NOT DETECTED.  The SARS-CoV-2 RNA is generally detectable in upper respiratory specimens during the acute phase of infection. The lowest concentration of SARS-CoV-2 viral copies this assay can detect is 138 copies/mL. A negative result does not preclude SARS-Cov-2 infection and should not be used as the sole basis for treatment or other patient management decisions. A negative result may occur with  improper  specimen collection/handling, submission of specimen other than nasopharyngeal swab, presence of viral mutation(s) within the areas targeted by this assay, and inadequate number of viral copies(<138 copies/mL). A negative result must be combined with clinical observations, patient history, and epidemiological information. The expected result is Negative.  Fact Sheet for Patients:  EntrepreneurPulse.com.au  Fact Sheet for Healthcare Providers:  IncredibleEmployment.be  This test is no t yet approved or cleared by the [place] FDA and  has been authorized for detection and/or diagnosis of SARS-CoV-2 by FDA under an Emergency Use Authorization (EUA). This EUA will remain  in effect (meaning this test can be used) for the duration of the COVID-19 declaration under Section 564(b)(1) of the Act, 21 U.S.C.section 360bbb-3(b)(1), unless the authorization is terminated  or revoked sooner.       Influenza A by PCR NEGATIVE NEGATIVE Final   Influenza B by PCR NEGATIVE NEGATIVE Final    Comment: (NOTE) The Xpert Xpress SARS-CoV-2/FLU/RSV plus assay is intended as an aid in the diagnosis  of influenza from Nasopharyngeal swab specimens and should not be used as a sole basis for treatment. Nasal washings and aspirates are unacceptable for Xpert Xpress SARS-CoV-2/FLU/RSV testing.  Fact Sheet for Patients: EntrepreneurPulse.com.au  Fact Sheet for Healthcare Providers: IncredibleEmployment.be  This test is not yet approved or cleared by the [place] FDA and has been authorized for detection and/or diagnosis of SARS-CoV-2 by FDA under an Emergency Use Authorization (EUA). This EUA will remain in effect (meaning this test can be used) for the duration of the COVID-19 declaration under Section 564(b)(1) of the Act, 21 U.S.C. section 360bbb-3(b)(1), unless the authorization is terminated or revoked.  Performed at  [facility], [place] [address], [place], [place] [address]   Culture, blood (Routine x 2)     Status: None (Preliminary result)   Collection Time: 04/24/20  2:01 PM   Specimen: Site Not Specified; Blood  Result Value Ref Range Status   Specimen Description   Final    SITE NOT SPECIFIED Performed at [place] [address], [place], [place] [address]    Special Requests   Final    BOTTLES DRAWN AEROBIC AND ANAEROBIC Blood Culture adequate volume Performed at [place] [address] [place], [place] [address]    Culture   Final    NO GROWTH 3 DAYS Performed at [facility], [place] [address], [place], [place] [address]    Report Status PENDING  Incomplete  Culture, blood (Routine x 2)     Status: None (Preliminary result)   Collection Time: 04/24/20  2:04 PM   Specimen: Site Not Specified; Blood  Result Value Ref Range Status   Specimen Description   Final    SITE NOT SPECIFIED Performed at [place] [address] [place], [place] [address]    Special Requests   Final    BOTTLES DRAWN AEROBIC AND ANAEROBIC Blood Culture adequate volume Performed at [place] [address] [place], [place] [address]    Culture   Final    NO GROWTH 3 DAYS Performed at [facility], [place] [address], [place], [place] [address]    Report Status PENDING  Incomplete  Urine culture     Status: Abnormal (Preliminary result)   Collection Time: 04/24/20  3:41 PM   Specimen: Kidney; Urine  Result Value Ref Range Status   Specimen Description   Final    KIDNEY LT RENAL CYTOSCOPY Performed at [facility] [address] [place], [place] [address]    Special Requests   Final    NONE Performed at [facility], [place] [address], [place], [place] [address]    Culture (A)  Final    60,000 COLONIES/mL ESCHERICHIA COLI SUSCEPTIBILITIES TO  FOLLOW Performed at [facility], [place] [address] [place], [place] [address]    Report Status PENDING  Incomplete      Time spent in discharge (includes decision making & examination of pt): 35 minutes  04/27/2020, 12:03 PM   Cherene Altes, MD [facility] Office  [phone]

## 2020-04-29 ENCOUNTER — Ambulatory Visit: Payer: Self-pay | Admitting: Cardiology

## 2020-04-29 LAB — CULTURE, BLOOD (ROUTINE X 2)
Culture: NO GROWTH
Culture: NO GROWTH
Special Requests: ADEQUATE
Special Requests: ADEQUATE

## 2020-05-06 NOTE — Patient Instructions (Addendum)
DUE TO COVID-19 ONLY ONE VISITOR IS ALLOWED TO COME WITH YOU AND STAY IN THE WAITING ROOM ONLY DURING PRE OP AND PROCEDURE.   **NO VISITORS ARE ALLOWED IN THE SHORT STAY AREA OR RECOVERY ROOM!!**    COVID SWAB TESTING MUST BE COMPLETED ON:  Friday, 05-09-20 @ 8:30 AM   [address] [place], [place] [address]  (Must self quarantine after testing. Follow instructions on handout.)    Your procedure is scheduled on:  Tuesday, 05-13-20   Report to [facility] Main  Entrance   Report to admitting at 10:00 AM   Call this number if you have problems the morning of surgery [phone]   Do not eat food :After Midnight.   May have liquids until 9:00 AM day of surgery  CLEAR LIQUID DIET  Foods Allowed                                                                     Foods Excluded  Water, Black Coffee and tea, regular and decaf             liquids that you cannot  Plain Jell-O in any flavor  (No red)                                    see through such as: Fruit ices (not with fruit pulp)                                      milk, soups, orange juice              Iced Popsicles (No red)                                      All solid food                                   Apple juices Sports drinks like Gatorade (No red) Lightly seasoned clear broth or consume(fat free) Sugar, honey syrup    Oral Hygiene is also important to reduce your risk of infection.                                    Remember - BRUSH YOUR TEETH THE MORNING OF SURGERY WITH YOUR REGULAR TOOTHPASTE   Do NOT smoke after Midnight   Take these medicines the morning of surgery with A SIP OF WATER:  Amlodipine  How to Manage Your Diabetes Before and After Surgery  Why is it important to control my blood sugar before and after surgery? . Improving blood sugar levels before and after surgery helps healing and can limit problems. . A way of improving blood sugar control is eating a healthy diet by: o  Eating  less sugar and carbohydrates o  Increasing activity/exercise o  Talking with your doctor about reaching your  blood sugar goals . High blood sugars (greater than 180 mg/dL) can raise your risk of infections and slow your recovery, so you will need to focus on controlling your diabetes during the weeks before surgery. . Make sure that the doctor who takes care of your diabetes knows about your planned surgery including the date and location.  How do I manage my blood sugar before surgery? . Check your blood sugar at least 4 times a day, starting 2 days before surgery, to make sure that the level is not too high or low. o Check your blood sugar the morning of your surgery when you wake up and every 2 hours until you get to the Short Stay unit. . If your blood sugar is less than 70 mg/dL, you will need to treat for low blood sugar: o Do not take insulin. o Treat a low blood sugar (less than 70 mg/dL) with  cup of clear juice (cranberry or apple), 4 glucose tablets, OR glucose gel. o Recheck blood sugar in 15 minutes after treatment (to make sure it is greater than 70 mg/dL). If your blood sugar is not greater than 70 mg/dL on recheck, call [phone] for further instructions. . Report your blood sugar to the short stay nurse when you get to Short Stay.  . If you are admitted to the hospital after surgery: o Your blood sugar will be checked by the staff and you will probably be given insulin after surgery (instead of oral diabetes medicines) to make sure you have good blood sugar levels. o The goal for blood sugar control after surgery is 80-180 mg/dL.   WHAT DO I DO ABOUT MY DIABETES MEDICATION?  Marland Kitchen Do not take oral diabetes medicines (pills) the morning of surgery.  . THE DAY BEFORE SURGERY:  Take morning dose of Metaglip.  Do not take evening dose       . THE MORNING OF SURGERY:  Do not take Metaglip   Reviewed and Endorsed by [facility] Patient Education Committee, August 2015                                You may not have any metal on your body including hair pins, jewelry, and body piercings             Do not wear make-up, lotions, powders, perfumes/cologne, or deodorant             Do not wear nail polish.  Do not shave  48 hours prior to surgery.    Do not bring valuables to the hospital. [place].   Contacts, dentures or bridgework may not be worn into surgery.   Patients discharged the day of surgery will not be allowed to drive home.              Please read over the following fact sheets you were given: IF YOU HAVE QUESTIONS ABOUT YOUR PRE OP INSTRUCTIONS PLEASE CALL  [place] - Preparing for Surgery Before surgery, you can play an important role.  Because skin is not sterile, your skin needs to be as free of germs as possible.  You can reduce the number of germs on your skin by washing with CHG (chlorahexidine gluconate) soap before surgery.  CHG is an antiseptic cleaner which kills germs and bonds with the skin to continue killing germs even after  washing. Please DO NOT use if you have an allergy to CHG or antibacterial soaps.  If your skin becomes reddened/irritated stop using the CHG and inform your nurse when you arrive at Short Stay. Do not shave (including legs and underarms) for at least 48 hours prior to the first CHG shower.  You may shave your face/neck.  Please follow these instructions carefully:  1.  Shower with CHG Soap the night before surgery and the  morning of surgery.  2.  If you choose to wash your hair, wash your hair first as usual with your normal  shampoo.  3.  After you shampoo, rinse your hair and body thoroughly to remove the shampoo.                             4.  Use CHG as you would any other liquid soap.  You can apply chg directly to the skin and wash.  Gently with a scrungie or clean washcloth.  5.  Apply the CHG Soap to your body ONLY FROM THE NECK DOWN.   Do   not  use on face/ open                           Wound or open sores. Avoid contact with eyes, ears mouth and   genitals (private parts).                       Wash face,  Genitals (private parts) with your normal soap.             6.  Wash thoroughly, paying special attention to the area where your    surgery  will be performed.  7.  Thoroughly rinse your body with warm water from the neck down.  8.  DO NOT shower/wash with your normal soap after using and rinsing off the CHG Soap.                9.  Pat yourself dry with a clean towel.            10.  Wear clean pajamas.            11.  Place clean sheets on your bed the night of your first shower and do not  sleep with pets. Day of Surgery : Do not apply any lotions/deodorants the morning of surgery.  Please wear clean clothes to the hospital/surgery center.  FAILURE TO FOLLOW THESE INSTRUCTIONS MAY RESULT IN THE CANCELLATION OF YOUR SURGERY  PATIENT SIGNATURE_________________________________  NURSE SIGNATURE__________________________________  ________________________________________________________________________

## 2020-05-06 NOTE — Progress Notes (Addendum)
COVID Vaccine Completed: x2 Date COVID Vaccine completed: Has received booster: COVID vaccine manufacturer: Pfizer   Date of COVID positive in last 90 days: N/A  PCP - Willey Blade, MD Cardiologist - N/A  Chest x-ray - 04-24-20 Epic EKG - 04-24-20 Epic Stress Test - N/A ECHO - N/A Cardiac Cath - N/A Pacemaker/ICD device last checked: Spinal Cord Stimulator:  Sleep Study - yes, +sleep apnea CPAP - No, has been ordered but not set up as of yet.    Fasting Blood Sugar - 90 to 140 Checks Blood Sugar - 2 times a day  Blood Thinner Instructions: N/A Aspirin Instructions: Last Dose:  Activity level:  Can go up a flight of stairs and perform activities of daily living without stopping and without symptoms of chest pain or shortness of breath.    Anesthesia review: N/A  Patient denies shortness of breath, fever, cough and chest pain at PAT appointment   Patient verbalized understanding of instructions that were given to them at the PAT appointment. Patient was also instructed that they will need to review over the PAT instructions again at home before surgery.

## 2020-05-07 ENCOUNTER — Other Ambulatory Visit: Payer: Self-pay

## 2020-05-07 ENCOUNTER — Encounter (HOSPITAL_COMMUNITY)
Admission: RE | Admit: 2020-05-07 | Discharge: 2020-05-07 | Disposition: A | Discharge status: Home / Self Care | Payer: Federal, State, Local not specified - PPO | Source: Ambulatory Visit | Attending: Urology | Admitting: Urology

## 2020-05-07 ENCOUNTER — Encounter (HOSPITAL_COMMUNITY): Payer: Self-pay

## 2020-05-07 DIAGNOSIS — Z01812 Encounter for preprocedural laboratory examination: Secondary | ICD-10-CM | POA: Insufficient documentation

## 2020-05-07 HISTORY — DX: Sleep apnea, unspecified: G47.30

## 2020-05-07 HISTORY — DX: Personal history of urinary calculi: Z87.442

## 2020-05-07 LAB — CBC
HCT: 35.7 % — ABNORMAL LOW (ref 36.0–46.0)
Hemoglobin: 11.5 g/dL — ABNORMAL LOW (ref 12.0–15.0)
MCH: 27.3 pg (ref 26.0–34.0)
MCHC: 32.2 g/dL (ref 30.0–36.0)
MCV: 84.6 fL (ref 80.0–100.0)
Platelets: 433 10*3/uL — ABNORMAL HIGH (ref 150–400)
RBC: 4.22 MIL/uL (ref 3.87–5.11)
RDW: 15.2 % (ref 11.5–15.5)
WBC: 5 10*3/uL (ref 4.0–10.5)
nRBC: 0 % (ref 0.0–0.2)

## 2020-05-07 LAB — BASIC METABOLIC PANEL
Anion gap: 12 (ref 5–15)
BUN: 19 mg/dL (ref 8–23)
CO2: 22 mmol/L (ref 22–32)
Calcium: 10.3 mg/dL (ref 8.9–10.3)
Chloride: 105 mmol/L (ref 98–111)
Creatinine, Ser: 0.86 mg/dL (ref 0.44–1.00)
GFR, Estimated: >60 mL/min (ref >60)
Glucose, Bld: 159 mg/dL — ABNORMAL HIGH (ref 70–99)
Potassium: 4 mmol/L (ref 3.5–5.1)
Sodium: 139 mmol/L (ref 135–145)

## 2020-05-07 LAB — GLUCOSE, CAPILLARY: Glucose-Capillary: 161 mg/dL — ABNORMAL HIGH (ref 70–99)

## 2020-05-09 ENCOUNTER — Other Ambulatory Visit (HOSPITAL_COMMUNITY)
Admission: RE | Admit: 2020-05-09 | Discharge: 2020-05-09 | Disposition: A | Discharge status: Home / Self Care | Payer: Federal, State, Local not specified - PPO | Source: Ambulatory Visit | Attending: Urology | Admitting: Urology

## 2020-05-09 DIAGNOSIS — Z01812 Encounter for preprocedural laboratory examination: Secondary | ICD-10-CM | POA: Diagnosis present

## 2020-05-09 DIAGNOSIS — Z20822 Contact with and (suspected) exposure to covid-19: Secondary | ICD-10-CM | POA: Diagnosis not present

## 2020-05-09 LAB — SARS CORONAVIRUS 2 (TAT 6-24 HRS): SARS Coronavirus 2: NEGATIVE

## 2020-05-13 ENCOUNTER — Ambulatory Visit (HOSPITAL_COMMUNITY)
Admission: RE | Admit: 2020-05-13 | Discharge: 2020-05-13 | Disposition: A | Discharge status: Home / Self Care | Payer: Federal, State, Local not specified - PPO | Attending: Urology | Admitting: Urology

## 2020-05-13 ENCOUNTER — Encounter (HOSPITAL_COMMUNITY): Payer: Self-pay | Admitting: Urology

## 2020-05-13 ENCOUNTER — Ambulatory Visit (HOSPITAL_COMMUNITY): Payer: Federal, State, Local not specified - PPO | Admitting: Certified Registered"

## 2020-05-13 ENCOUNTER — Ambulatory Visit (HOSPITAL_COMMUNITY): Payer: Federal, State, Local not specified - PPO

## 2020-05-13 ENCOUNTER — Encounter (HOSPITAL_COMMUNITY)
Admission: RE | Disposition: A | Discharge status: Home / Self Care | Payer: Self-pay | Source: Home / Self Care | Attending: Urology

## 2020-05-13 DIAGNOSIS — E119 Type 2 diabetes mellitus without complications: Secondary | ICD-10-CM | POA: Diagnosis not present

## 2020-05-13 DIAGNOSIS — Z87891 Personal history of nicotine dependence: Secondary | ICD-10-CM | POA: Insufficient documentation

## 2020-05-13 DIAGNOSIS — I10 Essential (primary) hypertension: Secondary | ICD-10-CM | POA: Diagnosis not present

## 2020-05-13 DIAGNOSIS — Z7984 Long term (current) use of oral hypoglycemic drugs: Secondary | ICD-10-CM | POA: Diagnosis not present

## 2020-05-13 DIAGNOSIS — N3000 Acute cystitis without hematuria: Secondary | ICD-10-CM | POA: Insufficient documentation

## 2020-05-13 DIAGNOSIS — Z87442 Personal history of urinary calculi: Secondary | ICD-10-CM | POA: Insufficient documentation

## 2020-05-13 DIAGNOSIS — Z800 Family history of malignant neoplasm of digestive organs: Secondary | ICD-10-CM | POA: Insufficient documentation

## 2020-05-13 DIAGNOSIS — Z79899 Other long term (current) drug therapy: Secondary | ICD-10-CM | POA: Insufficient documentation

## 2020-05-13 DIAGNOSIS — Z8249 Family history of ischemic heart disease and other diseases of the circulatory system: Secondary | ICD-10-CM | POA: Diagnosis not present

## 2020-05-13 DIAGNOSIS — N202 Calculus of kidney with calculus of ureter: Secondary | ICD-10-CM | POA: Diagnosis present

## 2020-05-13 HISTORY — PX: CYSTOSCOPY/URETEROSCOPY/HOLMIUM LASER/STENT PLACEMENT: SHX6546

## 2020-05-13 LAB — GLUCOSE, CAPILLARY
Glucose-Capillary: 159 mg/dL — ABNORMAL HIGH (ref 70–99)
Glucose-Capillary: 114 mg/dL — ABNORMAL HIGH (ref 70–99)

## 2020-05-13 SURGERY — CYSTOSCOPY/URETEROSCOPY/HOLMIUM LASER/STENT PLACEMENT
Anesthesia: General | Site: Ureter | Laterality: Left

## 2020-05-13 MED ORDER — GLYCOPYRROLATE PF 0.2 MG/ML IJ SOSY
PREFILLED_SYRINGE | INTRAMUSCULAR | Status: AC
  Filled 2020-05-13: qty 1

## 2020-05-13 MED ORDER — DEXAMETHASONE SODIUM PHOSPHATE 10 MG/ML IJ SOLN
INTRAMUSCULAR | Status: DC | PRN
  Administered 2020-05-13: 4 mg via INTRAVENOUS

## 2020-05-13 MED ORDER — FENTANYL CITRATE (PF) 100 MCG/2ML IJ SOLN
INTRAMUSCULAR | Status: DC | PRN
  Administered 2020-05-13 (×2): 25 ug via INTRAVENOUS

## 2020-05-13 MED ORDER — ORAL CARE MOUTH RINSE: 15.0000 mL | Freq: Once | OROMUCOSAL | Status: AC

## 2020-05-13 MED ORDER — CEFAZOLIN SODIUM-DEXTROSE 2-4 GM/100ML-% IV SOLN
2.0000 g | INTRAVENOUS | Status: AC
  Administered 2020-05-13: 2 g via INTRAVENOUS
  Filled 2020-05-13: qty 100

## 2020-05-13 MED ORDER — SODIUM CHLORIDE 0.9 % IR SOLN
Status: DC | PRN
  Administered 2020-05-13: 1000 mL

## 2020-05-13 MED ORDER — PHENYLEPHRINE 40 MCG/ML (10ML) SYRINGE FOR IV PUSH (FOR BLOOD PRESSURE SUPPORT)
PREFILLED_SYRINGE | INTRAVENOUS | Status: AC
  Filled 2020-05-13: qty 10

## 2020-05-13 MED ORDER — PHENYLEPHRINE HCL-NACL 10-0.9 MG/250ML-% IV SOLN
INTRAVENOUS | Status: DC | PRN
  Administered 2020-05-13: 20 ug/min via INTRAVENOUS

## 2020-05-13 MED ORDER — LIDOCAINE 2% (20 MG/ML) 5 ML SYRINGE
INTRAMUSCULAR | Status: DC | PRN
  Administered 2020-05-13: 80 mg via INTRAVENOUS

## 2020-05-13 MED ORDER — CEPHALEXIN 500 MG PO CAPS: 500.0000 mg | ORAL_CAPSULE | Freq: Once | ORAL | 0 refills | Status: AC

## 2020-05-13 MED ORDER — SODIUM CHLORIDE 0.9 % IR SOLN
Status: DC | PRN
  Administered 2020-05-13: 6000 mL

## 2020-05-13 MED ORDER — PROPOFOL 10 MG/ML IV BOLUS
INTRAVENOUS | Status: AC
  Filled 2020-05-13: qty 20

## 2020-05-13 MED ORDER — LIDOCAINE 2% (20 MG/ML) 5 ML SYRINGE
INTRAMUSCULAR | Status: AC
  Filled 2020-05-13: qty 5

## 2020-05-13 MED ORDER — CHLORHEXIDINE GLUCONATE 0.12 % MT SOLN
15.0000 mL | Freq: Once | OROMUCOSAL | Status: AC
  Administered 2020-05-13: 15 mL via OROMUCOSAL

## 2020-05-13 MED ORDER — PROPOFOL 10 MG/ML IV BOLUS
INTRAVENOUS | Status: DC | PRN
  Administered 2020-05-13: 80 mg via INTRAVENOUS
  Administered 2020-05-13: 120 mg via INTRAVENOUS

## 2020-05-13 MED ORDER — ONDANSETRON HCL 4 MG/2ML IJ SOLN
INTRAMUSCULAR | Status: AC
  Filled 2020-05-13: qty 2

## 2020-05-13 MED ORDER — FENTANYL CITRATE (PF) 100 MCG/2ML IJ SOLN: 25.0000 ug | INTRAMUSCULAR | Status: DC | PRN

## 2020-05-13 MED ORDER — DEXAMETHASONE SODIUM PHOSPHATE 10 MG/ML IJ SOLN
INTRAMUSCULAR | Status: AC
  Filled 2020-05-13: qty 1

## 2020-05-13 MED ORDER — DROPERIDOL 2.5 MG/ML IJ SOLN: 0.6250 mg | Freq: Once | INTRAMUSCULAR | Status: DC | PRN

## 2020-05-13 MED ORDER — EPHEDRINE SULFATE 50 MG/ML IJ SOLN
INTRAMUSCULAR | Status: DC | PRN
  Administered 2020-05-13 (×2): 5 mg via INTRAVENOUS

## 2020-05-13 MED ORDER — LACTATED RINGERS IV SOLN: INTRAVENOUS | Status: DC

## 2020-05-13 MED ORDER — PHENYLEPHRINE 40 MCG/ML (10ML) SYRINGE FOR IV PUSH (FOR BLOOD PRESSURE SUPPORT)
PREFILLED_SYRINGE | INTRAVENOUS | Status: DC | PRN
  Administered 2020-05-13: 120 ug via INTRAVENOUS

## 2020-05-13 MED ORDER — ONDANSETRON HCL 4 MG/2ML IJ SOLN
INTRAMUSCULAR | Status: DC | PRN
  Administered 2020-05-13: 4 mg via INTRAVENOUS

## 2020-05-13 MED ORDER — FENTANYL CITRATE (PF) 250 MCG/5ML IJ SOLN
INTRAMUSCULAR | Status: AC
  Filled 2020-05-13: qty 5

## 2020-05-13 MED ORDER — GLYCOPYRROLATE 0.2 MG/ML IJ SOLN
INTRAMUSCULAR | Status: DC | PRN
  Administered 2020-05-13: .1 mg via INTRAVENOUS

## 2020-05-13 MED ORDER — EPHEDRINE 5 MG/ML INJ
INTRAVENOUS | Status: AC
  Filled 2020-05-13: qty 10

## 2020-05-13 SURGICAL SUPPLY — 19 items
BAG URO CATCHER STRL LF (MISCELLANEOUS) ×2 IMPLANT
BASKET ZERO TIP NITINOL 2.4FR (BASKET) ×1 IMPLANT
BSKT STON RTRVL ZERO TP 2.4FR (BASKET) ×1
CATH URET 5FR 28IN OPEN ENDED (CATHETERS) ×2 IMPLANT
CLOTH BEACON ORANGE TIMEOUT ST (SAFETY) ×2 IMPLANT
DRSG TEGADERM 2-3/8X2-3/4 SM (GAUZE/BANDAGES/DRESSINGS) IMPLANT
EXTRACTOR STONE 1.7FRX115CM (UROLOGICAL SUPPLIES) IMPLANT
FIBER LASER TRACTIP 200 (UROLOGICAL SUPPLIES) ×1 IMPLANT
GLOVE SURG ENC MOIS LTX SZ6.5 (GLOVE) ×2 IMPLANT
GOWN STRL REUS W/TWL LRG LVL3 (GOWN DISPOSABLE) ×2 IMPLANT
GUIDEWIRE STR DUAL SENSOR (WIRE) ×3 IMPLANT
KIT TURNOVER KIT A (KITS) ×2 IMPLANT
MANIFOLD NEPTUNE II (INSTRUMENTS) ×2 IMPLANT
PACK CYSTO (CUSTOM PROCEDURE TRAY) ×2 IMPLANT
SHEATH URETERAL 12FRX28CM (UROLOGICAL SUPPLIES) ×1 IMPLANT
SHEATH URETERAL 12FRX35CM (MISCELLANEOUS) IMPLANT
STENT URET 6FRX24 CONTOUR (STENTS) ×1 IMPLANT
TUBING CONNECTING 10 (TUBING) ×2 IMPLANT
TUBING UROLOGY SET (TUBING) ×1 IMPLANT

## 2020-05-13 NOTE — Transfer of Care (Signed)
Immediate Anesthesia Transfer of Care Note  Patient: Diane Wilkerson  Procedure(s) Performed: CYSTOSCOPY/URETEROSCOPY/HOLMIUM LASER/STENT EXCHANGE (Left Ureter)  Patient Location: PACU  Anesthesia Type:General  Level of Consciousness: sedated  Airway & Oxygen Therapy: Patient Spontanous Breathing and Patient connected to face mask oxygen  Post-op Assessment: Report given to RN and Post -op Vital signs reviewed and stable  Post vital signs: Reviewed  Last Vitals:  Vitals Value Taken Time  BP 138/74 05/13/20 1316  Temp    Pulse 99 05/13/20 1317  Resp 18 05/13/20 1317  SpO2 100 % 05/13/20 1317  Vitals shown include unvalidated device data.  Last Pain:  Vitals:   05/13/20 1035  TempSrc: Oral  PainSc: 0-No pain         Complications: No complications documented.

## 2020-05-13 NOTE — Discharge Instructions (Signed)
DISCHARGE INSTRUCTIONS FOR KIDNEY STONE/URETERAL STENT   MEDICATIONS:  1. Resume all your other meds from home  2. Acetaminophen (tylenol) 500 mg every 6 hours of plainTylenol will help treat your pain.   3. Take 1 tab cephalexin 1 hour prior to stent removal to help prevent infection    ACTIVITY:  1. No strenuous activity x 1week  2. No driving while on narcotic pain medications  3. Drink plenty of water  4. Continue to walk at home - you can still get blood clots when you are at home, so keep active, but don't over do it.  5. May return to work/school tomorrow or when you feel ready   BATHING:  1. You can shower and we recommend daily showers  2. You have a string coming from your urethra: The stent string is attached to your ureteral stent. Do not pull on this.   SIGNS/SYMPTOMS TO CALL:  Please call us if you have a fever greater than 101.5, uncontrolled nausea/vomiting, uncontrolled pain, dizziness, unable to urinate, bloody urine, chest pain, shortness of breath, leg swelling, leg pain, redness around wound, drainage from wound, or any other concerns or questions.   You can reach [place] at [phone].   FOLLOW-UP:  1. You have a string attached to your stent, you may remove it on Friday, 05/16/20. To do this, pull the string until the stent is completely removed. You may feel an odd sensation in your back.  If you would like a nurse to remove it, please call the office to have this scheduled.

## 2020-05-13 NOTE — Anesthesia Preprocedure Evaluation (Addendum)
Anesthesia Evaluation  Patient identified by MRN, date of birth, ID band Patient awake    Reviewed: Allergy & Precautions, NPO status , Patient's Chart, lab work & pertinent test results  Airway Mallampati: II  TM Distance: >3 FB Neck ROM: Full    Dental no notable dental hx. (+) Dental Advisory Given, Teeth Intact   Pulmonary sleep apnea ,    Pulmonary exam normal breath sounds clear to auscultation       Cardiovascular hypertension, Pt. on medications Normal cardiovascular exam Rhythm:Regular Rate:Normal     Neuro/Psych negative neurological ROS     GI/Hepatic negative GI ROS, Neg liver ROS,   Endo/Other  diabetes  Renal/GU Renal disease     Musculoskeletal   Abdominal (+) + obese,   Peds  Hematology  (+) anemia ,   Anesthesia Other Findings   Reproductive/Obstetrics                            Anesthesia Physical  Anesthesia Plan  ASA: III  Anesthesia Plan: General   Post-op Pain Management:    Induction: Intravenous  PONV Risk Score and Plan: 4 or greater and Ondansetron, Dexamethasone, Treatment may vary due to age or medical condition and Diphenhydramine  Airway Management Planned: LMA  Additional Equipment: None  Intra-op Plan:   Post-operative Plan: Extubation in OR  Informed Consent: I have reviewed the patients History and Physical, chart, labs and discussed the procedure including the risks, benefits and alternatives for the proposed anesthesia with the patient or authorized representative who has indicated his/her understanding and acceptance.     Dental advisory given  Plan Discussed with: CRNA  Anesthesia Plan Comments:        Anesthesia Quick Evaluation

## 2020-05-13 NOTE — Anesthesia Procedure Notes (Signed)
Procedure Name: LMA Insertion Performed by: Milford Cage, CRNA Pre-anesthesia Checklist: Patient identified, Emergency Drugs available, Suction available and Patient being monitored Patient Re-evaluated:Patient Re-evaluated prior to induction Oxygen Delivery Method: Circle System Utilized Preoxygenation: Pre-oxygenation with 100% oxygen Induction Type: IV induction Ventilation: Mask ventilation without difficulty LMA: LMA inserted LMA Size: 4.0 Number of attempts: 2 Airway Equipment and Method: Bite block Placement Confirmation: positive ETCO2 Tube secured with: Tape Dental Injury: Teeth and Oropharynx as per pre-operative assessment

## 2020-05-13 NOTE — Anesthesia Postprocedure Evaluation (Signed)
Anesthesia Post Note  Patient: Diane Wilkerson  Procedure(s) Performed: CYSTOSCOPY/URETEROSCOPY/HOLMIUM LASER/STENT EXCHANGE (Left Ureter)     Patient location during evaluation: PACU Anesthesia Type: General Level of consciousness: sedated and patient cooperative Pain management: pain level controlled Vital Signs Assessment: post-procedure vital signs reviewed and stable Respiratory status: spontaneous breathing Cardiovascular status: stable Anesthetic complications: no   No complications documented.  Last Vitals:  Vitals:   05/13/20 1400 05/13/20 1435  BP: (!) 152/68 (!) 142/67  Pulse: 90 97  Resp: 20 16  Temp: 36.4 C 36.6 C  SpO2: 97% 100%    Last Pain:  Vitals:   05/13/20 1435  TempSrc: Oral  PainSc: 0-No pain                 Nolon Nations

## 2020-05-13 NOTE — Op Note (Signed)
Preoperative diagnosis: left ureteral and renal calculus  Postoperative diagnosis: left ureteral and renal calculus  Procedure:  1. Cystoscopy 2. left ureteroscopy, laser lithotripsy, basket stone extraction 3. left 60F x 24cm ureteral stent exchange-with tether  Surgeon: Jacalyn Lefevre, MD  Anesthesia: General  Complications: None  Intraoperative findings:  1.  Normal urethra 2.  Bladder mucosa normal without masses or stones 3.  Existing left ureteral stent seen emanating from left ureteral orifice encrusted with stones 4.  4 mm mid left ureteral calculus and 5 mm left lower pole calculus fragmented and removed  EBL: Minimal  Specimens: None  Disposition of specimens: [facility] for stone analysis  Indication: Diane Wilkerson is a 80 y.o.   patient with a 4 mm  left ureteral stone, 5 mm nonobstructing left renal calculus and associated left symptoms and infection who underwent prior ureteral stenting. After reviewing the management options for treatment, the patient elected to proceed with the above surgical procedure(s). We have discussed the potential benefits and risks of the procedure, side effects of the proposed treatment, the likelihood of the patient achieving the goals of the procedure, and any potential problems that might occur during the procedure or recuperation. Informed consent has been obtained.   Description of procedure:  The patient was taken to the operating room and general anesthesia was induced.  The patient was placed in the dorsal lithotomy position, prepped and draped in the usual sterile fashion, and preoperative antibiotics were administered. A preoperative time-out was performed.   Cystourethroscopy was performed.  The patient's urethra was examined and was normal.  There was no evidence for any bladder tumors, stones, or other mucosal pathology.    Attention then turned to the left ureteral orifice and a grasper was used to remove  the stent and bring it to the urethral meatus.  Next a 0.38 sensor wire was advanced through the ureteral stent up to the kidney with fluoroscopic guidance.  The stent was removed.  Semirigid ureteroscopy then took place alongside the wire until the stone was encountered in the mid left ureter.  200 m laser fiber was then used to fragment the stone and the larger pieces were removed with a 0 tip basket.    Next a second 0.38 sensor wire was advanced through the ureteroscope and the ureteroscope was removed.  The ureteral access sheath was then placed over the second wire with fluoroscopic guidance.  The second wire and inner sheath were removed.  Flexible ureteroscopy then took place.  A 5 mm lower pole calculus was then seen.  It was fragmented with a 200 m laser fiber and the larger pieces were basketed with a 0 tip basket.  The ureteral access sheath was then removed and unison with the flexible ureteroscope taking care to examine the ureter on the way out.  No residual stone burden or injury to the ureter was noted.   A 6 French by 24 cm double-J ureteral stent was then placed over the remaining sensor wire with fluoroscopic guidance.  A proximal curl was seen in the kidney with fluoroscopy and a distal curl was then seen in the bladder with cystoscopy.  The patient's bladder was decompressed.   The patient appeared to tolerate the procedure well and without complications.  The patient was able to be awakened and transferred to the recovery unit in satisfactory condition.   Disposition: The tether of the stent was left on and tucked inside the patient's vagina.  Instructions for removing  the stent have been provided to the patient in 3 days.

## 2020-05-13 NOTE — Interval H&P Note (Signed)
History and Physical Interval Note:  05/13/2020 11:34 AM  Diane Wilkerson  has presented today for surgery, with the diagnosis of LEFT URETERAL CALCULUS AND LEFT RENAL CALCULUS.  The various methods of treatment have been discussed with the patient and family. After consideration of risks, benefits and other options for treatment, the patient has consented to  Procedure(s): CYSTOSCOPY/URETEROSCOPY/HOLMIUM LASER/STENT EXCHANGE (Left) as a surgical intervention.  The patient's history has been reviewed, patient examined, no change in status, stable for surgery.  I have reviewed the patient's chart and labs.  Questions were answered to the patient's satisfaction.     Leyli Kevorkian D Gemma Ruan

## 2020-05-13 NOTE — H&P (Signed)
CC/HPI: cc: postop renal US   06/26/19: 80 year old woman who initially presented to the ED on 05/05/2019 found to have a UVJ calculus associated with fever and AKI. She went to the OR on 05/02 and underwent ureteroscopy with basket stone extraction. She returns today with a postop renal ultrasound. This was patient's 1st stone episode. She is feeling well since surgery. Postop renal ultrasound shows resolution of hydronephrosis. She does have some calcification in the left lower pole as well as bilateral renal cysts.   10/19/19: Patient with above-noted history. She presents today with complaints of left flank pain. She states this is been ongoing intermittently for the past few weeks. She notes this more after sitting for certain periods of times and while laying in bed. It does not radiate to her abdomen. She has not been using any pain medication for this. No complaints of associated nausea or vomiting. She denies exacerbation of voiding symptoms, dysuria, or gross hematuria. She denies fevers or chills. No interval passage of any stone material. Previous stone composition was noted to be primarily uric acid.   11/02/19: Patient with above-noted history. She returns today for ongoing evaluation. Renal ultrasound at prior office visit was reassuring without obvious stone burden or overt hydronephrosis. She states that she has been using Tylenol and bio freeze for previous pain noted in her left flank and lower back. She states that pain has now resolved and she has not noted any pain for the past several days. She denies gross hematuria. She denies interval passage of stone material. She currently feels she is voiding at baseline. No complaints of fever, chills, nausea, or vomiting.   04/24/20: Patient with above noted past medical history. She presents today with severe left flank pain associated with urinary frequency, dysuria and rigors. She denies fevers. She has a significant history of stones.    05/05/2020: Patient with above noted past medical history. She underwent emergent stent placement on 04/24/2020 due to urosepsis from an obstructing left-sided ureteral stone. She tolerated the procedure well and was able to be discharged from the hospital. She presents today for follow-up before undergoing definitive stone management in her left ureteral stone. She currently has a stent in place. She denies fevers and chills. She just finished a round of cefuroxitime. She denies flank pain. She reports intermittent pick tinged urine.     ALLERGIES: None    MEDICATIONS: Amlodipine Besylate 10 mg tablet  Amlodipine Besylate 10 mg tablet  Glipizide-Metformin 2.5 mg-250 mg tablet 1 tablet PO Daily     GU PSH: Cystoscopy Insert Stent - 04/24/2020, Left - 05/06/2019 Ureteroscopic stone removal, Left - 05/06/2019     NON-GU PSH: None   GU PMH: Acute Cystitis/UTI - 04/24/2020, - 05/22/2019 Ureteral calculus - 04/24/2020, No ureteral calculi or hydronephrosis seen on ultrasound today. Did discuss stone composition with patient which was uric acid. We discussed behavior modification being drinking 2-3 L of water daily, reduced animal protein and limitation of sodium intake., - 06/26/2019 Flank Pain - 11/02/2019 Renal calculus - 11/02/2019, - 10/19/2019 (Stable), Patient has small nonobstructing left lower pole calcification on ultrasound. This is stable from CT scan., - 06/26/2019, - 05/22/2019, - 05/22/2019    NON-GU PMH: Anxiety Arthritis Diabetes Type 2 Hypertension Sleep Apnea    FAMILY HISTORY: 1 Daughter - Daughter 1 son - Son Death - Mother, Father Heart Attack - Father Pancreatic carcinoma - Mother   SOCIAL HISTORY: Marital Status: Married Preferred Language: English; Ethnicity: Not Hispanic Or Latino;  Race: Black or African American Current Smoking Status: Patient does not smoke anymore. Smoked for 45 years.   Tobacco Use Assessment Completed: Used Tobacco in last 30 days? Does not  drink anymore.  Drinks 1 caffeinated drink per day. Patient's occupation is/was Retired.    REVIEW OF SYSTEMS:    GU Review Female:   Patient denies frequent urination, hard to postpone urination, burning /pain with urination, get up at night to urinate, leakage of urine, stream starts and stops, trouble starting your stream, have to strain to urinate, and being pregnant.  Gastrointestinal (Upper):   Patient denies indigestion/ heartburn, vomiting, and nausea.  Constitutional:   Patient denies fever, night sweats, weight loss, and fatigue.  Musculoskeletal:   Patient denies back pain and joint pain.  Neurological:   Patient denies headaches and dizziness.  Psychologic:   Patient denies depression and anxiety.   Notes: She denies fevers, rigors, flank pain, nausea, vomiting, fatigue and weakness.     VITAL SIGNS:      05/05/2020 11:37 AM  Weight 190 lb / 86.18 kg  Height 62 in / 157.48 cm  BP 178/91 mmHg  Pulse 103 /min  Temperature 97.3 F / 36.2 C  BMI 34.7 kg/m   MULTI-SYSTEM PHYSICAL EXAMINATION:    Constitutional: Well-nourished. No physical deformities. Normally developed. Good grooming.  Respiratory: No labored breathing, no use of accessory muscles.   Cardiovascular: Warm extremities. Normal extremity pulses, no swelling, no varicosities.   Skin: No paleness, no jaundice, no cyanosis. No lesion, no ulcer, no rash.  Neurologic / Psychiatric: Oriented to time, oriented to place, oriented to person. No depression, no anxiety, no agitation.  Gastrointestinal: No mass, no tenderness, no rigidity, non obese abdomen.  Musculoskeletal: Normal gait and station of head and neck.     Complexity of Data:  Source Of History:  Patient, Medical Record Summary  Records Review:   Previous Doctor Records, Previous Hospital Records, Previous Patient Records  Urine Test Review:   Urinalysis, Urine Culture   04/25/20  Other  Component 1 SEE NOTE   Specimen Source NOT GIVEN   Stone Weight  0.036 g   PROCEDURES:          Urinalysis w/Scope Dipstick Dipstick Cont'd Micro  Color: Red Bilirubin: Neg mg/dL WBC/hpf: 0 - 5/hpf  Appearance: Cloudy Ketones: Neg mg/dL RBC/hpf: 10 - 20/hpf  Specific Gravity: 1.015 Blood: 3+ ery/uL Bacteria: Rare (0-9/hpf)  pH: 5.5 Protein: 2+ mg/dL Cystals: NS (Not Seen)  Glucose: Neg mg/dL Urobilinogen: 0.2 mg/dL Casts: NS (Not Seen)    Nitrites: Neg Trichomonas: Not Present    Leukocyte Esterase: Trace leu/uL Mucous: Present      Epithelial Cells: NS (Not Seen)      Yeast: NS (Not Seen)      Sperm: Not Present    Notes: unspun microscopic    ASSESSMENT:      ICD-10 Details  1 GU:   Acute Cystitis/UTI - N30.00 Acute, Systemic Symptoms  2   Ureteral calculus - N20.1 Left, Acute, Systemic Symptoms   PLAN:           Orders Labs CULTURE, URINE          Document Letter(s):  Created for Patient: Clinical Summary         Notes:   Urine sent for precautionary culture. I will follow up with her regarding the results of the culture. She understands to return to the clinic for any concerns of fevers, rigors, changes to her mental  status or worsening pain. Advised she keep upcoming surgical appointment in 10 days for definitive stone management. She verbalized her understanding.

## 2020-05-14 ENCOUNTER — Encounter (HOSPITAL_COMMUNITY): Payer: Self-pay | Admitting: Urology

## 2020-05-22 ENCOUNTER — Emergency Department (HOSPITAL_COMMUNITY): Payer: Federal, State, Local not specified - PPO

## 2020-05-22 ENCOUNTER — Emergency Department (HOSPITAL_COMMUNITY)
Admission: EM | Admit: 2020-05-22 | Discharge: 2020-05-22 | Disposition: A | Discharge status: Home / Self Care | Payer: Federal, State, Local not specified - PPO | Attending: Emergency Medicine | Admitting: Emergency Medicine

## 2020-05-22 ENCOUNTER — Encounter (HOSPITAL_COMMUNITY): Payer: Self-pay

## 2020-05-22 ENCOUNTER — Other Ambulatory Visit: Payer: Self-pay

## 2020-05-22 DIAGNOSIS — Z7984 Long term (current) use of oral hypoglycemic drugs: Secondary | ICD-10-CM | POA: Diagnosis not present

## 2020-05-22 DIAGNOSIS — E860 Dehydration: Secondary | ICD-10-CM | POA: Insufficient documentation

## 2020-05-22 DIAGNOSIS — I10 Essential (primary) hypertension: Secondary | ICD-10-CM | POA: Insufficient documentation

## 2020-05-22 DIAGNOSIS — R5383 Other fatigue: Secondary | ICD-10-CM | POA: Diagnosis present

## 2020-05-22 DIAGNOSIS — E119 Type 2 diabetes mellitus without complications: Secondary | ICD-10-CM | POA: Diagnosis not present

## 2020-05-22 DIAGNOSIS — R109 Unspecified abdominal pain: Secondary | ICD-10-CM | POA: Diagnosis not present

## 2020-05-22 DIAGNOSIS — Z79899 Other long term (current) drug therapy: Secondary | ICD-10-CM | POA: Diagnosis not present

## 2020-05-22 LAB — CBC WITH DIFFERENTIAL/PLATELET
Abs Immature Granulocytes: 0.04 10*3/uL (ref 0.00–0.07)
Basophils Absolute: 0.1 10*3/uL (ref 0.0–0.1)
Basophils Relative: 1 %
Eosinophils Absolute: 0.1 10*3/uL (ref 0.0–0.5)
Eosinophils Relative: 2 %
HCT: 40.6 % (ref 36.0–46.0)
Hemoglobin: 12.6 g/dL (ref 12.0–15.0)
Immature Granulocytes: 1 %
Lymphocytes Relative: 24 %
Lymphs Abs: 1.7 10*3/uL (ref 0.7–4.0)
MCH: 26.6 pg (ref 26.0–34.0)
MCHC: 31 g/dL (ref 30.0–36.0)
MCV: 85.7 fL (ref 80.0–100.0)
Monocytes Absolute: 0.7 10*3/uL (ref 0.1–1.0)
Monocytes Relative: 10 %
Neutro Abs: 4.4 10*3/uL (ref 1.7–7.7)
Neutrophils Relative %: 62 %
Platelets: 426 10*3/uL — ABNORMAL HIGH (ref 150–400)
RBC: 4.74 MIL/uL (ref 3.87–5.11)
RDW: 14.9 % (ref 11.5–15.5)
WBC: 7 10*3/uL (ref 4.0–10.5)
nRBC: 0 % (ref 0.0–0.2)

## 2020-05-22 LAB — COMPREHENSIVE METABOLIC PANEL
ALT: 22 U/L (ref 0–44)
AST: 22 U/L (ref 15–41)
Albumin: 4.6 g/dL (ref 3.5–5.0)
Alkaline Phosphatase: 72 U/L (ref 38–126)
Anion gap: 9 (ref 5–15)
BUN: 22 mg/dL (ref 8–23)
CO2: 24 mmol/L (ref 22–32)
Calcium: 9.8 mg/dL (ref 8.9–10.3)
Chloride: 105 mmol/L (ref 98–111)
Creatinine, Ser: 1.18 mg/dL — ABNORMAL HIGH (ref 0.44–1.00)
GFR, Estimated: 48 mL/min — ABNORMAL LOW (ref >60)
Glucose, Bld: 144 mg/dL — ABNORMAL HIGH (ref 70–99)
Potassium: 3.9 mmol/L (ref 3.5–5.1)
Sodium: 138 mmol/L (ref 135–145)
Total Bilirubin: 0.4 mg/dL (ref 0.3–1.2)
Total Protein: 8.2 g/dL — ABNORMAL HIGH (ref 6.5–8.1)

## 2020-05-22 LAB — URINALYSIS, ROUTINE W REFLEX MICROSCOPIC
Bilirubin Urine: NEGATIVE
Glucose, UA: NEGATIVE mg/dL
Hgb urine dipstick: NEGATIVE
Ketones, ur: NEGATIVE mg/dL
Leukocytes,Ua: NEGATIVE
Nitrite: NEGATIVE
Protein, ur: NEGATIVE mg/dL
Specific Gravity, Urine: 1.012 (ref 1.005–1.030)
pH: 5 (ref 5.0–8.0)

## 2020-05-22 LAB — LACTIC ACID, PLASMA
Lactic Acid, Venous: 2.2 mmol/L (ref 0.5–1.9)
Lactic Acid, Venous: 1.6 mmol/L (ref 0.5–1.9)

## 2020-05-22 LAB — TROPONIN I (HIGH SENSITIVITY)
Troponin I (High Sensitivity): 4 ng/L (ref <18)
Troponin I (High Sensitivity): 4 ng/L (ref <18)

## 2020-05-22 LAB — LIPASE, BLOOD: Lipase: 45 U/L (ref 11–51)

## 2020-05-22 NOTE — ED Provider Notes (Signed)
[facility] DEPT Provider Note   CSN: [id number] Arrival date & time: 05/22/20  1631     History Chief Complaint  Patient presents with  . Fatigue  . Gastroesophageal Reflux    Diane Wilkerson is a 80 y.o. female.  The history is provided by the patient and medical records. No language interpreter was used.  Gastroesophageal Reflux     80 year old female significant history of kidney stone with recent ureteral stent removal, hypertension, diabetes, brought here from [facility] urology for evaluation of fatigue.  Patient states over a week ago she had a procedure to remove kidney stone on the left side and removal of ureteral stent.  She was doing fine however for the past few days she endorsed feeling very fatigued, having some indigestion, and overall not feeling well.  She endorsed occasional pain to her left flank from the procedure but is sporadic and minimal.  She does not complain of any fever or chills no runny nose sneezing or coughing aside from her usual cough.  She denies any chest pain or shortness of breath no complaint of abdominal pain or dysuria or hematuria.  She went to [facility] urology for regular checkup.  States UA obtained at that time was unremarkable but her doctor was concerned about her fatigue and recommend patient to come to the ER for further evaluation.  Patient has been vaccinated for COVID-19.  She does not complain of any significant chest pain just indigestion  Past Medical History:  Diagnosis Date  . Essential hypertension 05/06/2019  . History of kidney stones   . Pulmonary sarcoidosis ([place])   . Sleep apnea   . Uncontrolled type 2 diabetes mellitus with hyperglycemia, without long-term current use of insulin ([place]) 05/06/2019    Patient Active Problem List   Diagnosis Date Noted  . E coli bacteremia 05/07/2019  . Complicated UTI (urinary tract infection) 05/07/2019  . Left ureteral stone 05/07/2019  . Hydronephrosis, left  05/07/2019  . Folate deficiency 05/07/2019  . Iron deficiency anemia 05/07/2019  . Essential hypertension 05/06/2019  . Uncontrolled type 2 diabetes mellitus with hyperglycemia, without long-term current use of insulin ([place]) 05/06/2019  . AKI (acute kidney injury) ([place]) 05/06/2019  . Lactic acidosis 05/06/2019  . Hypokalemia, inadequate intake 05/06/2019  . 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 Severe sepsis with acute organ dysfunction due to gram-negative bacteria ([place]) 05/06/2019  . Acute pyelonephritis 05/06/2019    Past Surgical History:  Procedure Laterality Date  . CYSTOSCOPY W/ URETERAL STENT PLACEMENT Left 04/24/2020   Procedure: CYSTOSCOPY WITH RETROGRADE PYELOGRAM/URETERAL STENT PLACEMENT;  Surgeon: Janith Lima, MD;  Location: [facility];  Service: Urology;  Laterality: Left;  . CYSTOSCOPY WITH STENT PLACEMENT Left 05/06/2019   Procedure: Cystoscopy with retrograde pyleogram and left stent placement, basket removal of stone foley placement;  Surgeon: Festus Aloe, MD;  Location: [facility];  Service: Urology;  Laterality: Left;  . CYSTOSCOPY/URETEROSCOPY/HOLMIUM LASER/STENT PLACEMENT Left 05/13/2020   Procedure: CYSTOSCOPY/URETEROSCOPY/HOLMIUM LASER/STENT EXCHANGE;  Surgeon: Robley Fries, MD;  Location: [facility];  Service: Urology;  Laterality: Left;  . TONSILLECTOMY       OB History   No obstetric history on file.     Family History  Problem Relation Age of Onset  . Pancreatic cancer Mother   . Heart disease Father     Social History   Tobacco Use  . Smoking status: Never Smoker  . Smokeless tobacco: Never Used  Vaping Use  . Vaping Use: Never  used  Substance Use Topics  . Alcohol use: Not Currently  . Drug use: Never    Home Medications Prior to Admission medications   Medication Sig Start Date End Date Taking? Authorizing Provider  acetaminophen (TYLENOL) 325 MG tablet Take 2 tablets (650 mg total) by mouth every 6 (six) hours as needed for mild pain,  fever or headache. 04/27/20   Cherene Altes, MD  amLODipine (NORVASC) 10 MG tablet Take 10 mg by mouth daily. 04/23/20   [provider]  cholecalciferol (VITAMIN D3) 25 MCG (1000 UNIT) tablet Take 1,000 Units by mouth daily.    [provider]  Coenzyme Q10 (CO Q 10 PO) Take 1 tablet by mouth daily.    [provider]  Cyanocobalamin (VITAMIN B 12 PO) Take 1 tablet by mouth daily.    [provider]  glipizide-metformin (METAGLIP) 2.5-250 MG tablet Take 1 tablet by mouth 2 (two) times daily. 04/21/19   [provider]    Allergies    Patient has no known allergies.  Review of Systems   Review of Systems  All other systems reviewed and are negative.   Physical Exam Updated Vital Signs BP (!) 144/82   Pulse (!) 104   Temp 99.1 F (37.3 C) (Oral)   Resp 19   Ht 5\' 2"  (1.575 m)   Wt 88.5 kg   LMP  (LMP Unknown) Comment: postmenopausal  SpO2 98%   BMI 35.67 kg/m   Physical Exam Vitals and nursing note reviewed.  Constitutional:      General: She is not in acute distress.    Appearance: She is well-developed.  HENT:     Head: Atraumatic.  Eyes:     Conjunctiva/sclera: Conjunctivae normal.  Cardiovascular:     Rate and Rhythm: Normal rate and regular rhythm.     Pulses: Normal pulses.     Heart sounds: Normal heart sounds.  Pulmonary:     Effort: Pulmonary effort is normal.     Breath sounds: Normal breath sounds. No wheezing, rhonchi or rales.  Abdominal:     Palpations: Abdomen is soft.     Tenderness: There is no abdominal tenderness. There is no right CVA tenderness or left CVA tenderness.  Musculoskeletal:     Cervical back: Neck supple.     Comments: 5 out of 5 strength to all 4 extremities.  Skin:    Findings: No rash.  Neurological:     Mental Status: She is alert and oriented to person, place, and time.  Psychiatric:        Mood and Affect: Mood normal.     ED Results / Procedures / Treatments   Labs (all  labs ordered are listed, but only abnormal results are displayed) Labs Reviewed  CBC WITH DIFFERENTIAL/PLATELET - Abnormal; Notable for the following components:      Result Value   Platelets 426 (*)    All other components within normal limits  COMPREHENSIVE METABOLIC PANEL - Abnormal; Notable for the following components:   Glucose, Bld 144 (*)    Creatinine, Ser 1.18 (*)    Total Protein 8.2 (*)    GFR, Estimated 48 (*)    All other components within normal limits  URINALYSIS, ROUTINE W REFLEX MICROSCOPIC - Abnormal; Notable for the following components:   Color, Urine STRAW (*)    All other components within normal limits  LACTIC ACID, PLASMA - Abnormal; Notable for the following components:   Lactic Acid, Venous 2.2 (*)  All other components within normal limits  URINE CULTURE  CULTURE, BLOOD (ROUTINE X 2)  CULTURE, BLOOD (ROUTINE X 2)  LIPASE, BLOOD  LACTIC ACID, PLASMA  TROPONIN I (HIGH SENSITIVITY)  TROPONIN I (HIGH SENSITIVITY)    EKG None  10:06 PM  Date: 05/22/2020  Rate: 102  Rhythm: sinus tachycardia  QRS Axis: normal  Intervals: normal  ST/T Wave abnormalities: normal  Conduction Disutrbances: none  Narrative Interpretation:   Old EKG Reviewed: No significant changes noted     Radiology CT ABDOMEN PELVIS WO CONTRAST  Result Date: 05/22/2020 CLINICAL DATA:  Flank pain. EXAM: CT ABDOMEN AND PELVIS WITHOUT CONTRAST TECHNIQUE: Multidetector CT imaging of the abdomen and pelvis was performed following the standard protocol without IV contrast. COMPARISON:  April 24, 2020. FINDINGS: Lower chest: Small sliding-type hiatal hernia is noted. Visualized lung bases are unremarkable. Hepatobiliary: No gallstones or biliary dilatation is noted. Hepatic steatosis. Pancreas: Unremarkable. No pancreatic ductal dilatation or surrounding inflammatory changes. Spleen: Stable splenic cyst is noted. Adrenals/Urinary Tract: Stable bilateral adrenal adenomas are noted. Stable  bilateral renal cysts are noted. Stable nonobstructive calculus seen in lower pole collecting system of left kidney. No hydronephrosis or renal obstruction is noted. Urinary bladder is unremarkable. Stomach/Bowel: Stomach is within normal limits. Appendix appears normal. No evidence of bowel wall thickening, distention, or inflammatory changes. Vascular/Lymphatic: Aortic atherosclerosis. No enlarged abdominal or pelvic lymph nodes. Reproductive: Multiple uterine fibroids are again noted. No adnexal abnormality is noted. Other: Small fat containing periumbilical hernia. No ascites is noted. Musculoskeletal: No acute or significant osseous findings. IMPRESSION: Nonobstructive left renal calculus. No hydronephrosis or renal obstruction is noted. Stable sliding-type hiatal hernia. Hepatic steatosis. Fibroid uterus. Stable small fat containing periumbilical hernia. Aortic Atherosclerosis (ICD10-I70.0). Electronically Signed   By: Marijo Conception M.D.   On: 05/22/2020 21:49   DG Chest 2 View  Result Date: 05/22/2020 CLINICAL DATA:  Fatigue. EXAM: CHEST - 2 VIEW COMPARISON:  April 24, 2020 FINDINGS: The heart size and mediastinal contours are within normal limits. Both lungs are clear. The visualized skeletal structures are unremarkable. IMPRESSION: No active cardiopulmonary disease. Electronically Signed   By: Virgina Norfolk M.D.   On: 05/22/2020 17:59    Procedures Procedures   Medications Ordered in ED Medications - No data to display  ED Course  I have reviewed the triage vital signs and the nursing notes.  Pertinent labs & imaging results that were available during my care of the patient were reviewed by me and considered in my medical decision making (see chart for details).    MDM Rules/Calculators/A&P                          BP (!) 154/80   Pulse 86   Temp 99.1 F (37.3 C) (Oral)   Resp 15   Ht 5\' 2"  (1.575 m)   Wt 88.5 kg   LMP  (LMP Unknown) Comment: postmenopausal  SpO2 100%    BMI 35.67 kg/m   Final Clinical Impression(s) / ED Diagnoses Final diagnoses:  Fatigue, unspecified type  Dehydration    Rx / DC Orders ED Discharge Orders    None     7:12 PM Patient recently had removal of ureteral stenting over a week ago, has been on Keflex for 10 days who presents with complaints of some indigestion as well as generalized fatigue.  She does not have any active chest pain and no significant abdominal pain.  Chest x-ray  obtained today show no acute finding  9:10 PM Initial lactic acid was 2.2, repeat lactate is 1.6.  UA is currently pending, troponins are negative, normal WBC, electrolyte panels are mostly reassuring.  Mild AKI with creatinine of 1.18.  Normal lipase, chest x-ray unremarkable, EKG without concerning changes.  10:06 PM Abdominal pelvis CT scan showed a nonobstructive left renal calculus but no evidence of hydronephrosis or renal obstruction.  No acute finding.   Domenic Moras, PA-C 05/22/20 2310    Horton, Alvin Critchley, DO 05/22/20 2326

## 2020-05-22 NOTE — ED Triage Notes (Signed)
Patient was at [facility] for a follow up and patient c/o fatigue and intermittent indigestion x 3-4 days ago following surgery on 03/13/20.

## 2020-05-22 NOTE — ED Provider Notes (Signed)
Emergency Medicine Provider Triage Evaluation Note  Diane Wilkerson , a 80 y.o. female  was evaluated in triage.  Pt complains of fatigue and reflux symptoms. No urinary complaints. Epigastric pain, feels like needing to belch however cant. No emesis, fever. Feels extremely fatigued. No covid exposures. Coughing up yellow phlegm. Hx of sepsis. Recent stent removal. No flank pain.  Review of Systems  Positive: Cough, fatigue Negative: Sob, fever, emesis  Physical Exam  BP (!) 176/74 (BP Location: Right Arm)   Pulse (!) 114   Temp 99.1 F (37.3 C) (Oral)   Resp 19   Ht 5\' 2"  (1.575 m)   Wt 88.5 kg   LMP  (LMP Unknown) Comment: postmenopausal  SpO2 97%   BMI 35.67 kg/m  Gen:   Awake, no distress   Resp:  Normal effort  MSK:   Moves extremities without difficulty  Other:    Medical Decision Making  Medically screening exam initiated at 5:17 PM.  Appropriate orders placed.  ANUOLUWAPO MEFFERD was informed that the remainder of the evaluation will be completed by another provider, this initial triage assessment does not replace that evaluation, and the importance of remaining in the ED until their evaluation is complete.  Fatigue, reflux.   Jarl Sellitto A, PA-C 05/22/20 1719    Little, Wenda Overland, MD 05/22/20 2246

## 2020-05-22 NOTE — ED Notes (Signed)
Pt to ct via stretcher

## 2020-05-22 NOTE — Discharge Instructions (Addendum)
Your symptoms are likely due to dehydration.  Please stay hydrated.  Your abdominal and pelvis CT scan did not show any concerning finding.  Follow up with your doctor for further care.

## 2020-05-24 LAB — URINE CULTURE: Culture: <10000 — AB

## 2020-05-27 LAB — CULTURE, BLOOD (ROUTINE X 2)
Culture: NO GROWTH
Culture: NO GROWTH
Special Requests: ADEQUATE

## 2020-07-09 ENCOUNTER — Other Ambulatory Visit: Payer: Self-pay

## 2020-07-09 ENCOUNTER — Ambulatory Visit: Payer: 59 | Admitting: Cardiology

## 2020-07-09 ENCOUNTER — Other Ambulatory Visit: Payer: Self-pay | Admitting: Student

## 2020-07-09 ENCOUNTER — Encounter: Payer: Self-pay | Admitting: Cardiology

## 2020-07-09 VITALS — BP 154/84 | HR 109 | Temp 98.3°F | Resp 16 | Ht 62.0 in | Wt 196.8 lb

## 2020-07-09 DIAGNOSIS — E1165 Type 2 diabetes mellitus with hyperglycemia: Secondary | ICD-10-CM

## 2020-07-09 DIAGNOSIS — I10 Essential (primary) hypertension: Secondary | ICD-10-CM

## 2020-07-09 DIAGNOSIS — R0609 Other forms of dyspnea: Secondary | ICD-10-CM

## 2020-07-09 DIAGNOSIS — D860 Sarcoidosis of lung: Secondary | ICD-10-CM

## 2020-07-09 DIAGNOSIS — E7800 Pure hypercholesterolemia, unspecified: Secondary | ICD-10-CM

## 2020-07-09 MED ORDER — KERENDIA 10 MG PO TABS: 10.0000 mg | ORAL_TABLET | Freq: Every day | ORAL | 3 refills | Status: DC

## 2020-07-09 MED ORDER — METOPROLOL SUCCINATE ER 50 MG PO TB24: 50.0000 mg | ORAL_TABLET | Freq: Every evening | ORAL | 3 refills | Status: DC

## 2020-07-09 NOTE — Progress Notes (Signed)
Primary Physician/Referring:  Willey Blade, MD  Patient ID: Diane Wilkerson, female    DOB: January 05, 1940, 80 y.o.   MRN: [id number]  Chief Complaint  Patient presents with   Fatigue   New Patient (Initial Visit)    Referred by Dr. Karlton Lemon   HPI:    Diane Wilkerson  is a 80 y.o. 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  She is referred to our office by Dr. Willey Blade for evaluation of fatigue and shortness of breath.  Patient is also being evaluated by her PCP for hypercalcemia.   Notably patient was admitted 04/24/2020 - 04/27/2020 with sepsis due to urologic obstruction and pyelonephritis, she underwent surgery with stent placement and recovered well and was discharged.  She then presented to [facility] emergency department 05/22/2020 with primary concern of fatigue, at which time chest x-ray was normal, troponins negative, and CT abdomen and pelvis revealed nonobstructive left renal calculus as well as aortic atherosclerosis.  Patient presents to our office for evaluation of worsening dyspnea on exertion and fatigue over the last 3 months since hospital admission for urosepsis.  She has been focusing on weight loss although has been struggling with this.  She states her activity has been limited to keeping house since discharge from the hospital.  She reports difficulty sleeping due to poor sleep hygiene as well as daytime fatigue.  Denies chest pain, palpitations, dizziness, syncope, near syncope.  Denies orthopnea, PND.  Past Medical History:  Diagnosis Date   Essential hypertension 05/06/2019   History of kidney stones    Pulmonary sarcoidosis ([place])    Sleep apnea    Uncontrolled type 2 diabetes mellitus with hyperglycemia, without long-term current use of insulin ([place]) 05/06/2019   Past Surgical History:  Procedure Laterality Date   CYSTOSCOPY W/ URETERAL  STENT PLACEMENT Left 04/24/2020   Procedure: CYSTOSCOPY WITH RETROGRADE PYELOGRAM/URETERAL STENT PLACEMENT;  Surgeon: Janith Lima, MD;  Location: [facility];  Service: Urology;  Laterality: Left;   CYSTOSCOPY WITH STENT PLACEMENT Left 05/06/2019   Procedure: Cystoscopy with retrograde pyleogram and left stent placement, basket removal of stone foley placement;  Surgeon: Festus Aloe, MD;  Location: [facility];  Service: Urology;  Laterality: Left;   CYSTOSCOPY/URETEROSCOPY/HOLMIUM LASER/STENT PLACEMENT Left 05/13/2020   Procedure: CYSTOSCOPY/URETEROSCOPY/HOLMIUM LASER/STENT EXCHANGE;  Surgeon: Robley Fries, MD;  Location: [facility];  Service: Urology;  Laterality: Left;   TONSILLECTOMY     Family History  Problem Relation Age of Onset   Pancreatic cancer Mother    Heart disease Father    Cancer Sister    Diabetes Sister    Cancer Brother     Social History   Tobacco Use   Smoking status: Never   Smokeless tobacco: Never  Substance Use Topics   Alcohol use: Not Currently   Marital Status: Married   ROS  Review of Systems  Constitutional: Positive for malaise/fatigue.  Cardiovascular:  Positive for dyspnea on exertion. Negative for chest pain, claudication, leg swelling, near-syncope, orthopnea, palpitations, paroxysmal nocturnal dyspnea and syncope.  Respiratory:  Positive for snoring.   Neurological:  Negative for dizziness.   Objective  Blood pressure (!) 154/84, pulse (!) 109, temperature 98.3 F (36.8 C), temperature source Temporal, resp. rate 16, height 5\' 2"  (1.575 m), weight 196 lb 12.8 oz (89.3 kg), SpO2 95 %.  Vitals with BMI 07/09/2020 07/09/2020 05/22/2020  Height - 5\' 2"  -  Weight - 196 lbs 13 oz -  BMI - 65.68 -  Systolic 127 517 001  Diastolic 84 83 82  Pulse 749 112 77    Physical Exam Vitals reviewed.  Constitutional:      Appearance: She is obese.  Neck:     Vascular: No carotid bruit.  Cardiovascular:     Rate and Rhythm: Regular rhythm. Tachycardia  present.     Pulses: Intact distal pulses.          Carotid pulses are 2+ on the right side and 2+ on the left side.      Radial pulses are 2+ on the right side and 2+ on the left side.       Popliteal pulses are 2+ on the right side and 2+ on the left side.       Dorsalis pedis pulses are 2+ on the right side and 2+ on the left side.       Posterior tibial pulses are 2+ on the right side and 2+ on the left side.     Heart sounds: S1 normal and S2 normal. No murmur heard.   No gallop.  Pulmonary:     Effort: Pulmonary effort is normal. No respiratory distress.     Breath sounds: No wheezing, rhonchi or rales.  Abdominal:     General: Bowel sounds are normal.     Palpations: Abdomen is soft.  Musculoskeletal:     Right lower leg: No edema.     Left lower leg: No edema.  Neurological:     Mental Status: She is alert.    Laboratory examination:   Recent Labs    04/27/20 0425 05/07/20 0909 05/22/20 1802  NA 141 139 138  K 3.8 4.0 3.9  CL 105 105 105  CO2 28 22 24   GLUCOSE 129* 159* 144*  BUN 21 19 22   CREATININE 1.02* 0.86 1.18*  CALCIUM 9.6 10.3 9.8  GFRNONAA 57* >60 48*   CrCl cannot be calculated (Patient's most recent lab result is older than the maximum 21 days allowed.).  CMP Latest Ref Rng & Units 05/22/2020 05/07/2020 04/27/2020  Glucose 70 - 99 mg/dL 144(H) 159(H) 129(H)  BUN 8 - 23 mg/dL 22 19 21   Creatinine 0.44 - 1.00 mg/dL 1.18(H) 0.86 1.02(H)  Sodium 135 - 145 mmol/L 138 139 141  Potassium 3.5 - 5.1 mmol/L 3.9 4.0 3.8  Chloride 98 - 111 mmol/L 105 105 105  CO2 22 - 32 mmol/L 24 22 28   Calcium 8.9 - 10.3 mg/dL 9.8 10.3 9.6  Total Protein 6.5 - 8.1 g/dL 8.2(H) - -  Total Bilirubin 0.3 - 1.2 mg/dL 0.4 - -  Alkaline Phos 38 - 126 U/L 72 - -  AST 15 - 41 U/L 22 - -  ALT 0 - 44 U/L 22 - -   CBC Latest Ref Rng & Units 05/22/2020 05/07/2020 04/27/2020  WBC 4.0 - 10.5 K/uL 7.0 5.0 8.0  Hemoglobin 12.0 - 15.0 g/dL 12.6 11.5(L) 9.8(L)  Hematocrit 36.0 - 46.0 % 40.6  35.7(L) 30.8(L)  Platelets 150 - 400 K/uL 426(H) 433(H) 312    Lipid Panel No results for input(s): CHOL, TRIG, LDLCALC, VLDL, HDL, CHOLHDL, LDLDIRECT in the last 8760 hours.  HEMOGLOBIN A1C Lab Results  Component Value Date   HGBA1C 6.6 (H) 04/25/2020   MPG 142.72 04/25/2020   TSH Recent Labs    03/19/20 1755  TSH 2.278    External labs:   03/08/2020: TSH 2.16, free T3  2.4 (normal) free T4 1.21 (normal) A1c 7.1% Hgb 12.1, HCT 37.0, MCV 83, platelet 382 Glucose 150, BUN 29, creatinine 1.41, GFR 38, sodium 139, potassium 4.3, AST 22, ALT 27  Allergies  No Known Allergies   Medications Prior to Visit:   Outpatient Medications Prior to Visit  Medication Sig Dispense Refill   acetaminophen (TYLENOL) 325 MG tablet Take 2 tablets (650 mg total) by mouth every 6 (six) hours as needed for mild pain, fever or headache.     amLODipine (NORVASC) 10 MG tablet Take 10 mg by mouth daily.     Coenzyme Q10 (CO Q 10 PO) Take 1 tablet by mouth daily.     Cyanocobalamin (VITAMIN B 12 PO) Take 1 tablet by mouth daily.     glipizide-metformin (METAGLIP) 2.5-250 MG tablet Take 1 tablet by mouth 2 (two) times daily.     cholecalciferol (VITAMIN D3) 25 MCG (1000 UNIT) tablet Take 1,000 Units by mouth daily. (Patient not taking: Reported on 07/09/2020)     famotidine (PEPCID) 40 MG tablet Take 40 mg by mouth daily.     No facility-administered medications prior to visit.   Final Medications at End of Visit    Current Meds  Medication Sig   acetaminophen (TYLENOL) 325 MG tablet Take 2 tablets (650 mg total) by mouth every 6 (six) hours as needed for mild pain, fever or headache.   amLODipine (NORVASC) 10 MG tablet Take 10 mg by mouth daily.   Coenzyme Q10 (CO Q 10 PO) Take 1 tablet by mouth daily.   Cyanocobalamin (VITAMIN B 12 PO) Take 1 tablet by mouth daily.   Finerenone (KERENDIA) 10 MG TABS Take 10 mg by mouth daily.   glipizide-metformin (METAGLIP) 2.5-250 MG tablet Take 1 tablet by  mouth 2 (two) times daily.   metoprolol succinate (TOPROL-XL) 50 MG 24 hr tablet Take 1 tablet (50 mg total) by mouth every evening. Take with or immediately following a meal.   Radiology:   No results found.  Cardiac Studies:   None   EKG:   07/09/2020: Sinus tachycardia at a rate of 104 bpm.  Left atrial enlargement.  Normal axis.  Incomplete right bundle branch block.  No evidence of ischemia or underlying injury pattern.  Compared EKG 05/23/2020, no significant change.  05/23/2020: Sinus tachycardia at a rate of 102 bpm.  Left atrial enlargement.  Normal axis.  Incomplete right bundle branch block.  Assessment     ICD-10-CM   1. Essential hypertension  I10 EKG 12-Lead    metoprolol succinate (TOPROL-XL) 50 MG 24 hr tablet    Basic metabolic panel    Finerenone (KERENDIA) 10 MG TABS    Basic metabolic panel    2. Hypercholesterolemia  E78.00     3. Uncontrolled type 2 diabetes mellitus with hyperglycemia, without long-term current use of insulin (HCC)  E11.65 PCV MYOCARDIAL PERFUSION WO LEXISCAN    Finerenone (KERENDIA) 10 MG TABS    4. Pulmonary sarcoidosis ([place])  D86.0     5. Dyspnea on exertion  R06.00 PCV ECHOCARDIOGRAM COMPLETE    PCV MYOCARDIAL PERFUSION WO LEXISCAN    Brain natriuretic peptide       There are no discontinued medications.  Meds ordered this encounter  Medications   metoprolol succinate (TOPROL-XL) 50 MG 24 hr tablet    Sig: Take 1 tablet (50 mg total) by mouth every evening. Take with or immediately following a meal.    Dispense:  90 tablet    Refill:  3   Finerenone (KERENDIA) 10 MG TABS    Sig: Take 10 mg by mouth daily.    Dispense:  30 tablet    Refill:  3    Recommendations:   BOWIE DOIRON is a 80 y.o. 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  She is referred to our office by Dr. Willey Blade  for evaluation of fatigue and shortness of breath.  Given patient's symptoms and multiple cardiovascular risk factors, particularly type 2 diabetes although it is well controlled, will obtainEchocardiogram nuclear stress test.  We will also obtain baseline BNP and BMP.  Patient's blood pressure remains elevated at today's office visit, given her uncontrolled hypertension as well as stage III chronic kidney disease we will start her on Krendia 10 mg once daily with repeat BMP in 1 week. Also in view of hypertension and elevated heart rate will Start metoprolol succinate 50 mg once daily.  Further recommendations and follow up pending cardiovascular testing.   Patient was seen in collaboration with Dr. Einar Gip. He also reviewed patient's chart and examined the patient. Dr. Einar Gip is in agreement of the plan.    Alethia Berthold, PA-C 07/09/2020, 4:38 PM Office: [phone]

## 2020-07-10 NOTE — Telephone Encounter (Signed)
Pharmacy requested alternative

## 2020-07-10 NOTE — Telephone Encounter (Signed)
Can you please contact [employer] rep and see if he has recommendations for how we can get her on this medication? If not, we can consider spironolactone. Let me know.

## 2020-07-11 ENCOUNTER — Telehealth: Payer: Self-pay

## 2020-07-11 NOTE — Telephone Encounter (Signed)
Left message for drug rep mike

## 2020-07-11 NOTE — Telephone Encounter (Signed)
Called to see if we rec'vd fax for [place]. Not on formulary

## 2020-07-12 LAB — BASIC METABOLIC PANEL
BUN/Creatinine Ratio: 18 (ref 12–28)
BUN: 19 mg/dL (ref 8–27)
CO2: 21 mmol/L (ref 20–29)
Calcium: 10.4 mg/dL — ABNORMAL HIGH (ref 8.7–10.3)
Chloride: 103 mmol/L (ref 96–106)
Creatinine, Ser: 1.03 mg/dL — ABNORMAL HIGH (ref 0.57–1.00)
Glucose: 151 mg/dL — ABNORMAL HIGH (ref 65–99)
Potassium: 4.5 mmol/L (ref 3.5–5.2)
Sodium: 141 mmol/L (ref 134–144)
eGFR: 56 mL/min/{1.73_m2} — ABNORMAL LOW (ref >59)

## 2020-07-12 LAB — BRAIN NATRIURETIC PEPTIDE: BNP: 18.8 pg/mL (ref 0.0–100.0)

## 2020-07-14 NOTE — Telephone Encounter (Signed)
Follow up on prior auth

## 2020-07-15 ENCOUNTER — Ambulatory Visit: Payer: Federal, State, Local not specified - PPO

## 2020-07-15 ENCOUNTER — Other Ambulatory Visit: Payer: Self-pay

## 2020-07-15 DIAGNOSIS — R0600 Dyspnea, unspecified: Secondary | ICD-10-CM

## 2020-07-15 DIAGNOSIS — R0609 Other forms of dyspnea: Secondary | ICD-10-CM

## 2020-07-24 ENCOUNTER — Other Ambulatory Visit: Payer: Self-pay

## 2020-07-24 ENCOUNTER — Telehealth: Payer: Self-pay

## 2020-07-24 MED ORDER — SPIRONOLACTONE 25 MG PO TABS: 12.5000 mg | ORAL_TABLET | Freq: Every day | ORAL | 3 refills | Status: DC

## 2020-07-24 NOTE — Telephone Encounter (Signed)
Patient called, has questions about her Spironolactone, but the call dropped before we could finish the conversation. I tried calling patient back, she did not answer. If she calls back, see what her question is.

## 2020-07-24 NOTE — Telephone Encounter (Signed)
Lvm for pt and sent in her prescription

## 2020-07-24 NOTE — Telephone Encounter (Signed)
Please let the rep know, if he has no suggestions please let the pt know. Switch her from [place] to spironolactone 12.5 mg once daily instead. With repeat BMP in 2 weeks.

## 2020-07-29 ENCOUNTER — Telehealth: Payer: Self-pay

## 2020-07-29 DIAGNOSIS — I10 Essential (primary) hypertension: Secondary | ICD-10-CM

## 2020-07-30 ENCOUNTER — Other Ambulatory Visit: Payer: Self-pay

## 2020-07-30 ENCOUNTER — Ambulatory Visit: Payer: Federal, State, Local not specified - PPO

## 2020-07-30 DIAGNOSIS — R0600 Dyspnea, unspecified: Secondary | ICD-10-CM

## 2020-07-30 DIAGNOSIS — R0609 Other forms of dyspnea: Secondary | ICD-10-CM

## 2020-07-30 DIAGNOSIS — E1165 Type 2 diabetes mellitus with hyperglycemia: Secondary | ICD-10-CM

## 2020-07-30 NOTE — Telephone Encounter (Signed)
[employer] and confirmed that patient was approved for Kerendia PAP as of today. Approval period of 07/30/20 to 01/03/21. Called and discussed with pt. Pt agreeable to continue with [place]. Pt reports that she was recently told to switch to spironolactone since [place] was expensive thorugh her prescription coverage. Pt reports that she hasnt picked up from the spironolactone from the pharmacy yet and wanted to confirm if she should be taking both spironolactone and kerendia together. Discussed with Celeste. Agreeable to discontinue spironolactone and proceed only with [place]. Pt reports that her last dose of her remaining sample was yesterday. Will provide samples for pt to pick while waiting for delivery to be confirmed.   Discussed need to get repeat labs in 1 month since starting [place]. Orders pended.

## 2020-07-31 ENCOUNTER — Other Ambulatory Visit: Payer: Self-pay | Admitting: Student

## 2020-07-31 DIAGNOSIS — I10 Essential (primary) hypertension: Secondary | ICD-10-CM

## 2020-08-01 NOTE — Progress Notes (Signed)
Poor exercise capacity on stress test. Normal blood perfusion imaging.

## 2020-08-04 NOTE — Progress Notes (Signed)
Called and spoke with patient regarding her exercise stress test. She has a few questions and would like a call back from you. Thank you.

## 2020-08-04 NOTE — Progress Notes (Signed)
Called patient, questions addressed.

## 2020-08-20 ENCOUNTER — Other Ambulatory Visit: Payer: Self-pay | Admitting: Obstetrics and Gynecology

## 2020-08-20 DIAGNOSIS — N6489 Other specified disorders of breast: Secondary | ICD-10-CM

## 2020-08-25 ENCOUNTER — Ambulatory Visit: Payer: Federal, State, Local not specified - PPO

## 2020-08-25 ENCOUNTER — Ambulatory Visit
Admission: RE | Admit: 2020-08-25 | Discharge: 2020-08-25 | Disposition: A | Discharge status: Home / Self Care | Payer: Federal, State, Local not specified - PPO | Source: Ambulatory Visit | Attending: Obstetrics and Gynecology | Admitting: Obstetrics and Gynecology

## 2020-08-25 ENCOUNTER — Other Ambulatory Visit: Payer: Self-pay

## 2020-08-25 DIAGNOSIS — N6489 Other specified disorders of breast: Secondary | ICD-10-CM

## 2020-08-29 ENCOUNTER — Other Ambulatory Visit: Payer: Self-pay | Admitting: Student

## 2020-08-30 LAB — BRAIN NATRIURETIC PEPTIDE: BNP: 27.6 pg/mL (ref 0.0–100.0)

## 2020-08-30 LAB — BASIC METABOLIC PANEL
BUN/Creatinine Ratio: 24 (ref 12–28)
BUN: 23 mg/dL (ref 8–27)
CO2: 22 mmol/L (ref 20–29)
Calcium: 10.4 mg/dL — ABNORMAL HIGH (ref 8.7–10.3)
Chloride: 102 mmol/L (ref 96–106)
Creatinine, Ser: 0.97 mg/dL (ref 0.57–1.00)
Glucose: 134 mg/dL — ABNORMAL HIGH (ref 65–99)
Potassium: 4.3 mmol/L (ref 3.5–5.2)
Sodium: 141 mmol/L (ref 134–144)
eGFR: 60 mL/min/{1.73_m2} (ref >59)

## 2020-09-03 ENCOUNTER — Ambulatory Visit: Payer: 59 | Admitting: Student

## 2020-09-03 ENCOUNTER — Other Ambulatory Visit: Payer: Federal, State, Local not specified - PPO

## 2020-09-03 ENCOUNTER — Other Ambulatory Visit: Payer: Self-pay

## 2020-09-03 ENCOUNTER — Encounter: Payer: Self-pay | Admitting: Student

## 2020-09-03 VITALS — BP 148/76 | HR 65 | Temp 97.9°F | Ht 62.0 in | Wt 195.8 lb

## 2020-09-03 DIAGNOSIS — R0609 Other forms of dyspnea: Secondary | ICD-10-CM

## 2020-09-03 DIAGNOSIS — E7800 Pure hypercholesterolemia, unspecified: Secondary | ICD-10-CM

## 2020-09-03 DIAGNOSIS — R0600 Dyspnea, unspecified: Secondary | ICD-10-CM

## 2020-09-03 DIAGNOSIS — I10 Essential (primary) hypertension: Secondary | ICD-10-CM

## 2020-09-03 MED ORDER — HYDROCHLOROTHIAZIDE 12.5 MG PO CAPS
12.5000 mg | ORAL_CAPSULE | Freq: Every morning | ORAL | 3 refills | Status: DC
Start: 2020-09-03 — End: 2020-10-07

## 2020-09-03 NOTE — Progress Notes (Addendum)
Primary Physician/Referring:  Willey Blade, MD  Patient ID: Diane Wilkerson, female    DOB: 05-May-1940, 80 y.o.   MRN: [id number]  Chief Complaint  Patient presents with   Hypertension   Results   HPI:    Diane Wilkerson  is a 80 y.o. 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  She is referred to our office by Dr. Willey Blade for evaluation of fatigue and shortness of breath.  Patient is also being evaluated by her PCP for hypercalcemia.   Notably patient was admitted 04/24/2020 - 04/27/2020 with sepsis due to urologic obstruction and pyelonephritis.   Patient presents for follow-up of dyspnea on exertion and fatigue as well as results of cardiac testing.  Patient states that fatigue and dyspnea have significantly improved since last office visit.  She is now walking approximately 1 mile per day without issue and states this has been getting easier she has been exercising more frequently.  Denies chest pain, palpitations, dizziness, syncope, near syncope.  Denies orthopnea, PND.  Past Medical History:  Diagnosis Date   Essential hypertension 05/06/2019   History of kidney stones    Pulmonary sarcoidosis ([place])    Sleep apnea    Uncontrolled type 2 diabetes mellitus with hyperglycemia, without long-term current use of insulin ([place]) 05/06/2019   Past Surgical History:  Procedure Laterality Date   BREAST EXCISIONAL BIOPSY Left    CYSTOSCOPY W/ URETERAL STENT PLACEMENT Left 04/24/2020   Procedure: CYSTOSCOPY WITH RETROGRADE PYELOGRAM/URETERAL STENT PLACEMENT;  Surgeon: Janith Lima, MD;  Location: [facility];  Service: Urology;  Laterality: Left;   CYSTOSCOPY WITH STENT PLACEMENT Left 05/06/2019   Procedure: Cystoscopy with retrograde pyleogram and left stent placement, basket removal of stone foley placement;  Surgeon: Festus Aloe, MD;  Location: [facility];   Service: Urology;  Laterality: Left;   CYSTOSCOPY/URETEROSCOPY/HOLMIUM LASER/STENT PLACEMENT Left 05/13/2020   Procedure: CYSTOSCOPY/URETEROSCOPY/HOLMIUM LASER/STENT EXCHANGE;  Surgeon: Robley Fries, MD;  Location: [facility];  Service: Urology;  Laterality: Left;   TONSILLECTOMY     Family History  Problem Relation Age of Onset   Pancreatic cancer Mother    Heart disease Father    Breast cancer Sister    Cancer Sister    Diabetes Sister    Cancer Brother     Social History   Tobacco Use   Smoking status: Never   Smokeless tobacco: Never  Substance Use Topics   Alcohol use: Not Currently   Marital Status: Married   ROS  Review of Systems  Constitutional: Positive for malaise/fatigue (improving).  Cardiovascular:  Positive for dyspnea on exertion (improving). Negative for chest pain, claudication, leg swelling, near-syncope, orthopnea, palpitations, paroxysmal nocturnal dyspnea and syncope.  Respiratory:  Positive for snoring.   Neurological:  Negative for dizziness.   Objective  Blood pressure (!) 148/76, pulse 65, temperature 97.9 F (36.6 C), height '5\' 2"'$  (1.575 m), weight 195 lb 12.8 oz (88.8 kg), SpO2 99 %.  Vitals with BMI 09/03/2020 07/09/2020 07/09/2020  Height '5\' 2"'$  - '5\' 2"'$   Weight 195 lbs 13 oz - 196 lbs 13 oz  BMI 0000000 - 99991111  Systolic 123456 123456 0000000  Diastolic 76 84 83  Pulse 65 109 112    Physical Exam Vitals reviewed.  Constitutional:      Appearance: She is obese.  Neck:     Vascular: No carotid bruit.  Cardiovascular:  Rate and Rhythm: Normal rate and regular rhythm.     Pulses: Intact distal pulses.          Carotid pulses are 2+ on the right side and 2+ on the left side.      Radial pulses are 2+ on the right side and 2+ on the left side.       Popliteal pulses are 2+ on the right side and 2+ on the left side.       Dorsalis pedis pulses are 2+ on the right side and 2+ on the left side.       Posterior tibial pulses are 2+ on the right side and 2+  on the left side.     Heart sounds: S1 normal and S2 normal. No murmur heard.   No gallop.  Pulmonary:     Effort: Pulmonary effort is normal. No respiratory distress.     Breath sounds: No wheezing, rhonchi or rales.  Musculoskeletal:     Right lower leg: No edema.     Left lower leg: No edema.  Neurological:     Mental Status: She is alert.    Laboratory examination:   Recent Labs    04/27/20 0425 05/07/20 0909 05/22/20 1802 07/11/20 0827 08/29/20 0847  NA 141 139 138 141 141  K 3.8 4.0 3.9 4.5 4.3  CL 105 105 105 103 102  CO2 '28 22 24 21 22  '$ GLUCOSE 129* 159* 144* 151* 134*  BUN '21 19 22 19 23  '$ CREATININE 1.02* 0.86 1.18* 1.03* 0.97  CALCIUM 9.6 10.3 9.8 10.4* 10.4*  GFRNONAA 57* >60 48*  --   --    estimated creatinine clearance is 49.5 mL/min (by C-G formula based on SCr of 0.97 mg/dL).  CMP Latest Ref Rng & Units 08/29/2020 07/11/2020 05/22/2020  Glucose 65 - 99 mg/dL 134(H) 151(H) 144(H)  BUN 8 - 27 mg/dL '23 19 22  '$ Creatinine 0.57 - 1.00 mg/dL 0.97 1.03(H) 1.18(H)  Sodium 134 - 144 mmol/L 141 141 138  Potassium 3.5 - 5.2 mmol/L 4.3 4.5 3.9  Chloride 96 - 106 mmol/L 102 103 105  CO2 20 - 29 mmol/L '22 21 24  '$ Calcium 8.7 - 10.3 mg/dL 10.4(H) 10.4(H) 9.8  Total Protein 6.5 - 8.1 g/dL - - 8.2(H)  Total Bilirubin 0.3 - 1.2 mg/dL - - 0.4  Alkaline Phos 38 - 126 U/L - - 72  AST 15 - 41 U/L - - 22  ALT 0 - 44 U/L - - 22   CBC Latest Ref Rng & Units 05/22/2020 05/07/2020 04/27/2020  WBC 4.0 - 10.5 K/uL 7.0 5.0 8.0  Hemoglobin 12.0 - 15.0 g/dL 12.6 11.5(L) 9.8(L)  Hematocrit 36.0 - 46.0 % 40.6 35.7(L) 30.8(L)  Platelets 150 - 400 K/uL 426(H) 433(H) 312    Lipid Panel No results for input(s): CHOL, TRIG, LDLCALC, VLDL, HDL, CHOLHDL, LDLDIRECT in the last 8760 hours.  HEMOGLOBIN A1C Lab Results  Component Value Date   HGBA1C 6.6 (H) 04/25/2020   MPG 142.72 04/25/2020   TSH Recent Labs    03/19/20 1755  TSH 2.278    External labs:   03/08/2020: TSH 2.16, free  T3 2.4 (normal) free T4 1.21 (normal) A1c 7.1% Hgb 12.1, HCT 37.0, MCV 83, platelet 382 Glucose 150, BUN 29, creatinine 1.41, GFR 38, sodium 139, potassium 4.3, AST 22, ALT 27  Allergies  No Known Allergies   Medications Prior to Visit:   Outpatient Medications Prior to Visit  Medication Sig Dispense  Refill   acetaminophen (TYLENOL) 325 MG tablet Take 2 tablets (650 mg total) by mouth every 6 (six) hours as needed for mild pain, fever or headache.     amLODipine (NORVASC) 10 MG tablet Take 10 mg by mouth daily.     famotidine (PEPCID) 40 MG tablet Take 40 mg by mouth daily.     Finerenone (KERENDIA) 10 MG TABS Take 1 tablet by mouth daily.     glipizide-metformin (METAGLIP) 2.5-250 MG tablet Take 1 tablet by mouth 2 (two) times daily.     metoprolol succinate (TOPROL-XL) 50 MG 24 hr tablet Take 1 tablet (50 mg total) by mouth every evening. Take with or immediately following a meal. 90 tablet 3   B Complex-Biotin-FA (B COMPLETE) TABS B Complete  Take 1 capsule daily     Cholecalciferol (VITAMIN D3) 125 MCG (5000 UT) CAPS Vitamin D-3 5000 units w/K2  Take 1 1 capsule daily     cholecalciferol (VITAMIN D3) 25 MCG (1000 UNIT) tablet Take 1,000 Units by mouth daily. (Patient not taking: Reported on 07/09/2020)     Coenzyme Q10 (CO Q 10 PO) Take 1 tablet by mouth daily.     Cyanocobalamin (VITAMIN B 12 PO) Take 1 tablet by mouth daily.     Omega-3 Fatty Acids (ULTRA OMEGA 3) 1000 MG CAPS Ultra Omega  Ultra  Omega  Take 2 capsules daily     RESVERATROL PO Resvoxitrol  Take 1 capsule daily     No facility-administered medications prior to visit.   Final Medications at End of Visit    Current Meds  Medication Sig   acetaminophen (TYLENOL) 325 MG tablet Take 2 tablets (650 mg total) by mouth every 6 (six) hours as needed for mild pain, fever or headache.   amLODipine (NORVASC) 10 MG tablet Take 10 mg by mouth daily.   famotidine (PEPCID) 40 MG tablet Take 40 mg by mouth daily.    Finerenone (KERENDIA) 10 MG TABS Take 1 tablet by mouth daily.   glipizide-metformin (METAGLIP) 2.5-250 MG tablet Take 1 tablet by mouth 2 (two) times daily.   hydrochlorothiazide (MICROZIDE) 12.5 MG capsule Take 1 capsule (12.5 mg total) by mouth in the morning.   metoprolol succinate (TOPROL-XL) 50 MG 24 hr tablet Take 1 tablet (50 mg total) by mouth every evening. Take with or immediately following a meal.   Radiology:   No results found.  Cardiac Studies:   Echocardiogram 07/15/2020:  Left ventricle cavity is normal in size. Moderate concentric hypertrophy  of the left ventricle. Normal global wall motion. Normal LV systolic function with EF 65%. Indeterminate diastolic filling pattern due to E/A fusion.  No significant valvular abnormality.  IVC not seen.  Exercise Myoview stress test 07/30/2020: 1 Day Rest/Stress Protocol. Exercise time 1 minute 46 seconds on Bruce protocol, achieved 4.06 METS, and 104% age-predicted maximum heart rate.  Poor functional capacity for age, at the current workload stress ECG negative for ischemia. Medium size, mild intensity fixed perfusion defect due to diaphragmatic attenuation.  Otherwise normal myocardial perfusion without evidence of reversible ischemia or prior infarct. LVEF per gated SPECT 57%, normal wall motion, and normal ventricular size. At the current workload overall a low risk study.   Clinical correlation required due to poor functional capacity and tachycardia at baseline.  EKG:   07/09/2020: Sinus tachycardia at a rate of 104 bpm.  Left atrial enlargement.  Normal axis.  Incomplete right bundle branch block.  No evidence of ischemia or underlying injury  pattern.  Compared EKG 05/23/2020, no significant change.  05/23/2020: Sinus tachycardia at a rate of 102 bpm.  Left atrial enlargement.  Normal axis.  Incomplete right bundle branch block.  Assessment     ICD-10-CM   1. Essential hypertension  99991111 Basic metabolic panel    2.  Dyspnea on exertion  R06.00     3. Hypercholesterolemia  E78.00        Medications Discontinued During This Encounter  Medication Reason   cholecalciferol (VITAMIN D3) 25 MCG (1000 UNIT) tablet Duplicate   B Complex-Biotin-FA (B COMPLETE) TABS Error   Cholecalciferol (VITAMIN D3) 125 MCG (5000 UT) CAPS Error   Coenzyme Q10 (CO Q 10 PO) Error   Cyanocobalamin (VITAMIN B 12 PO) Error   Omega-3 Fatty Acids (ULTRA OMEGA 3) 1000 MG CAPS Error   RESVERATROL PO Error    Meds ordered this encounter  Medications   hydrochlorothiazide (MICROZIDE) 12.5 MG capsule    Sig: Take 1 capsule (12.5 mg total) by mouth in the morning.    Dispense:  30 capsule    Refill:  3    Recommendations:   Diane Wilkerson is a 80 y.o. 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  She is referred to our office by Dr. Willey Blade for evaluation of fatigue and shortness of breath.  Patient presents for follow-up of dyspnea on exertion and fatigue which have both improved as patient has increased her physical activity.  Reviewed and discussed with patient results of echocardiogram and nuclear stress testing, details above.  Echocardiogram revealed LVEF of 65% without significant valvular abnormalities.  Stress test showed poor exercise capacity, with normal myocardial perfusion.  Suspect patient's symptoms were related to deconditioning, therefore recommend patient continue to increase her physical activity.  She will notify our office if symptoms worsen or fail to improve, however at this time recommend watchful waiting.   In regard to patient's hypertension, blood pressure remains uncontrolled.  We will add hydrochlorothiazide 12.5 mg daily and repeat BMP in 1 week.  Follow-up in 6 weeks, sooner if needed, for hypertension.   Alethia Berthold, PA-C 09/03/2020, 9:19 AM Office:  [phone]  Addendum 10/22/2020: Patient's renal function is stable, will therefore add losartan 25 mg once daily for improved blood pressure control.  We will repeat BMP in 1 week   Alethia Berthold, PA-C 10/22/2020, 1:21 PM Office: [phone]

## 2020-09-03 NOTE — Patient Instructions (Signed)

## 2020-10-07 ENCOUNTER — Telehealth: Payer: Self-pay

## 2020-10-07 NOTE — Telephone Encounter (Signed)
Sorry she said she is having cramping and diarrhea she specifically wanted to talk to you.

## 2020-10-07 NOTE — Telephone Encounter (Signed)
Patient started hydrochlorothiazide and developed cramping in her legs hands and arms.  She therefore stopped the medication approximately 1 week ago and cramping has resolved.  She states in the last 1 week home blood pressure readings have averaged 138/72 mmHg.  Advised patient to continue to hold hydrochlorothiazide and will reevaluate blood pressure control at upcoming office visit.   Alethia Berthold, PA-C 10/07/2020, 2:54 PM Office: [phone]

## 2020-10-07 NOTE — Telephone Encounter (Signed)
What side effects is she having? Has she stopped the medication, if so for how long? What has her BP been running?

## 2020-10-07 NOTE — Telephone Encounter (Signed)
Patient called to say she is having side effects from hydrochlorothiazide and does not want to continue meds

## 2020-10-14 NOTE — Progress Notes (Signed)
Primary Physician/Referring:  Willey Blade, MD  Patient ID: Diane Wilkerson, female    DOB: [birth year], 80 y.o.   MRN: [id number]  Chief Complaint  Patient presents with   Hypertension   HPI:    Diane Wilkerson  is a 80 y.o. 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  She is referred to our office by Dr. Willey Blade for evaluation of fatigue and shortness of breath.  Patient is also being evaluated by her PCP for hypercalcemia.   Notably patient was admitted 04/24/2020 - 04/27/2020 with sepsis due to urologic obstruction and pyelonephritis.   Patient presents for 6-week follow-up of hypertension.  At last office visit started hydrochlorothiazide 12.5 mg daily, however patient developed leg cramping and discontinued the medication.  She reported leg cramping improved with stopping HCTZ.  Advised her to continue to monitor her blood pressure at home.  She now presents to reevaluate hypertension control.  Patient is presently tolerating amlodipine, metoprolol, and Krendia without issue.  Unfortunately she has not had repeat BMP.  Overall patient is feeling quite well.  She is currently walking 1 mile per day and eating a plant-based diet.  She is working on weight loss and says that her energy level is much improved.  Denies chest pain, palpitations, dizziness, syncope, near syncope.  She brings with her written log of home blood pressure readings which are significantly improved, but still mildly elevated averaging 135/80s mmHg.  Past Medical History:  Diagnosis Date   Essential hypertension 05/06/2019   History of kidney stones    Pulmonary sarcoidosis ([place])    Sleep apnea    Uncontrolled type 2 diabetes mellitus with hyperglycemia, without long-term current use of insulin ([place]) 05/06/2019   Past Surgical History:  Procedure Laterality Date   BREAST EXCISIONAL  BIOPSY Left    CYSTOSCOPY W/ URETERAL STENT PLACEMENT Left 04/24/2020   Procedure: CYSTOSCOPY WITH RETROGRADE PYELOGRAM/URETERAL STENT PLACEMENT;  Surgeon: Janith Lima, MD;  Location: [facility];  Service: Urology;  Laterality: Left;   CYSTOSCOPY WITH STENT PLACEMENT Left 05/06/2019   Procedure: Cystoscopy with retrograde pyleogram and left stent placement, basket removal of stone foley placement;  Surgeon: Festus Aloe, MD;  Location: [facility];  Service: Urology;  Laterality: Left;   CYSTOSCOPY/URETEROSCOPY/HOLMIUM LASER/STENT PLACEMENT Left 05/13/2020   Procedure: CYSTOSCOPY/URETEROSCOPY/HOLMIUM LASER/STENT EXCHANGE;  Surgeon: Robley Fries, MD;  Location: [facility];  Service: Urology;  Laterality: Left;   TONSILLECTOMY     Family History  Problem Relation Age of Onset   Pancreatic cancer Mother    Heart disease Father    Breast cancer Sister    Cancer Sister    Diabetes Sister    Cancer Brother     Social History   Tobacco Use   Smoking status: Never   Smokeless tobacco: Never  Substance Use Topics   Alcohol use: Not Currently   Marital Status: Married   ROS  Review of Systems  Constitutional: Negative for malaise/fatigue.  Cardiovascular:  Negative for chest pain, claudication, dyspnea on exertion, leg swelling, near-syncope, orthopnea, palpitations, paroxysmal nocturnal dyspnea and syncope.  Respiratory:  Positive for snoring.   Neurological:  Negative for dizziness.   Objective  Blood pressure 140/74, pulse 67, temperature 97.9 F (36.6 C), temperature source Temporal, height 5\' 2"  (1.575 m), weight 195 lb (88.5 kg), SpO2 98 %.  Vitals with BMI 10/15/2020 10/15/2020 09/03/2020  Height - 5\' 2"   5\' 2"   Weight - 195 lbs 195 lbs 13 oz  BMI - 29.92 42.6  Systolic 834 196 222  Diastolic 74 73 76  Pulse 67 69 65    Physical Exam Vitals reviewed.  Constitutional:      Appearance: She is obese.  Neck:     Vascular: No carotid bruit.  Cardiovascular:     Rate and  Rhythm: Normal rate and regular rhythm.     Pulses: Intact distal pulses.          Carotid pulses are 2+ on the right side and 2+ on the left side.      Radial pulses are 2+ on the right side and 2+ on the left side.       Popliteal pulses are 2+ on the right side and 2+ on the left side.       Dorsalis pedis pulses are 2+ on the right side and 2+ on the left side.       Posterior tibial pulses are 2+ on the right side and 2+ on the left side.     Heart sounds: S1 normal and S2 normal. No murmur heard.   No gallop.  Pulmonary:     Effort: Pulmonary effort is normal. No respiratory distress.     Breath sounds: No wheezing, rhonchi or rales.  Musculoskeletal:     Right lower leg: No edema.     Left lower leg: No edema.  Neurological:     Mental Status: She is alert.  Physical exam unchanged compared to previous visit.  Laboratory examination:   Recent Labs    04/27/20 0425 05/07/20 0909 05/22/20 1802 07/11/20 0827 08/29/20 0847  NA 141 139 138 141 141  K 3.8 4.0 3.9 4.5 4.3  CL 105 105 105 103 102  CO2 28 22 24 21 22   GLUCOSE 129* 159* 144* 151* 134*  BUN 21 19 22 19 23   CREATININE 1.02* 0.86 1.18* 1.03* 0.97  CALCIUM 9.6 10.3 9.8 10.4* 10.4*  GFRNONAA 57* >60 48*  --   --    CrCl cannot be calculated (Patient's most recent lab result is older than the maximum 21 days allowed.).  CMP Latest Ref Rng & Units 08/29/2020 07/11/2020 05/22/2020  Glucose 65 - 99 mg/dL 134(H) 151(H) 144(H)  BUN 8 - 27 mg/dL 23 19 22   Creatinine 0.57 - 1.00 mg/dL 0.97 1.03(H) 1.18(H)  Sodium 134 - 144 mmol/L 141 141 138  Potassium 3.5 - 5.2 mmol/L 4.3 4.5 3.9  Chloride 96 - 106 mmol/L 102 103 105  CO2 20 - 29 mmol/L 22 21 24   Calcium 8.7 - 10.3 mg/dL 10.4(H) 10.4(H) 9.8  Total Protein 6.5 - 8.1 g/dL - - 8.2(H)  Total Bilirubin 0.3 - 1.2 mg/dL - - 0.4  Alkaline Phos 38 - 126 U/L - - 72  AST 15 - 41 U/L - - 22  ALT 0 - 44 U/L - - 22   CBC Latest Ref Rng & Units 05/22/2020 05/07/2020 04/27/2020  WBC  4.0 - 10.5 K/uL 7.0 5.0 8.0  Hemoglobin 12.0 - 15.0 g/dL 12.6 11.5(L) 9.8(L)  Hematocrit 36.0 - 46.0 % 40.6 35.7(L) 30.8(L)  Platelets 150 - 400 K/uL 426(H) 433(H) 312    Lipid Panel No results for input(s): CHOL, TRIG, LDLCALC, VLDL, HDL, CHOLHDL, LDLDIRECT in the last 8760 hours.  HEMOGLOBIN A1C Lab Results  Component Value Date   HGBA1C 6.6 (H) 04/25/2020   MPG 142.72 04/25/2020   TSH Recent Labs  03/19/20 1755  TSH 2.278    External labs:   03/08/2020: TSH 2.16, free T3 2.4 (normal) free T4 1.21 (normal) A1c 7.1% Hgb 12.1, HCT 37.0, MCV 83, platelet 382 Glucose 150, BUN 29, creatinine 1.41, GFR 38, sodium 139, potassium 4.3, AST 22, ALT 27  Allergies  No Known Allergies   Medications Prior to Visit:   Outpatient Medications Prior to Visit  Medication Sig Dispense Refill   acetaminophen (TYLENOL) 325 MG tablet Take 2 tablets (650 mg total) by mouth every 6 (six) hours as needed for mild pain, fever or headache.     amLODipine (NORVASC) 10 MG tablet Take 10 mg by mouth daily.     Finerenone (KERENDIA) 10 MG TABS Take 1 tablet by mouth daily.     glipizide-metformin (METAGLIP) 2.5-250 MG tablet Take 1 tablet by mouth 2 (two) times daily.     metoprolol succinate (TOPROL-XL) 50 MG 24 hr tablet Take 1 tablet (50 mg total) by mouth every evening. Take with or immediately following a meal. 90 tablet 3   famotidine (PEPCID) 40 MG tablet Take 40 mg by mouth daily. (Patient not taking: Reported on 10/15/2020)     No facility-administered medications prior to visit.   Final Medications at End of Visit    Current Meds  Medication Sig   acetaminophen (TYLENOL) 325 MG tablet Take 2 tablets (650 mg total) by mouth every 6 (six) hours as needed for mild pain, fever or headache.   amLODipine (NORVASC) 10 MG tablet Take 10 mg by mouth daily.   Finerenone (KERENDIA) 10 MG TABS Take 1 tablet by mouth daily.   glipizide-metformin (METAGLIP) 2.5-250 MG tablet Take 1 tablet by mouth  2 (two) times daily.   metoprolol succinate (TOPROL-XL) 50 MG 24 hr tablet Take 1 tablet (50 mg total) by mouth every evening. Take with or immediately following a meal.   Radiology:   No results found.  Cardiac Studies:   Echocardiogram 07/15/2020:  Left ventricle cavity is normal in size. Moderate concentric hypertrophy  of the left ventricle. Normal global wall motion. Normal LV systolic function with EF 65%. Indeterminate diastolic filling pattern due to E/A fusion.  No significant valvular abnormality.  IVC not seen.  Exercise Myoview stress test 07/30/2020: 1 Day Rest/Stress Protocol. Exercise time 1 minute 46 seconds on Bruce protocol, achieved 4.06 METS, and 104% age-predicted maximum heart rate.  Poor functional capacity for age, at the current workload stress ECG negative for ischemia. Medium size, mild intensity fixed perfusion defect due to diaphragmatic attenuation.  Otherwise normal myocardial perfusion without evidence of reversible ischemia or prior infarct. LVEF per gated SPECT 57%, normal wall motion, and normal ventricular size. At the current workload overall a low risk study.   Clinical correlation required due to poor functional capacity and tachycardia at baseline.  EKG:   07/09/2020: Sinus tachycardia at a rate of 104 bpm.  Left atrial enlargement.  Normal axis.  Incomplete right bundle branch block.  No evidence of ischemia or underlying injury pattern.  Compared EKG 05/23/2020, no significant change.  05/23/2020: Sinus tachycardia at a rate of 102 bpm.  Left atrial enlargement.  Normal axis.  Incomplete right bundle branch block.  Assessment     ICD-10-CM   1. Essential hypertension  I10        Medications Discontinued During This Encounter  Medication Reason   famotidine (PEPCID) 40 MG tablet Error    No orders of the defined types were placed in this encounter.  Recommendations:   ARITHA HUCKEBA is a 80 y.o. 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  She is referred to our office by Dr. Willey Blade for evaluation of fatigue and shortness of breath.  Patient presents for 6-week follow-up of hypertension.  At last office visit started hydrochlorothiazide 12.5 mg daily, however patient developed leg cramping and discontinued the medication.  She reported leg cramping improved with stopping HCTZ.  Advised her to continue to monitor her blood pressure at home.  She now presents to reevaluate hypertension control.  Patient is tolerating amlodipine, metoprolol, and Krendia without issue.  She will have repeat BMP done today or tomorrow.  Blood pressure remains mildly elevated, therefore would recommend initiation of losartan if renal function allows.  Patient is open to this.  Patient is congratulated on her diet and lifestyle changes and encouraged to continue with these efforts.  She states her dyspnea has significantly improved and fatigue has resolved since increasing her physical activity.  Patient is otherwise stable from a cardiovascular standpoint.  Follow-up in 3 months, sooner if needed, for hypertension, hyperlipidemia.   Diane Berthold, PA-C 10/15/2020, 8:55 AM Office: [phone]

## 2020-10-15 ENCOUNTER — Ambulatory Visit: Payer: 59 | Admitting: Student

## 2020-10-15 ENCOUNTER — Encounter: Payer: Self-pay | Admitting: Student

## 2020-10-15 ENCOUNTER — Other Ambulatory Visit: Payer: Self-pay

## 2020-10-15 VITALS — BP 140/74 | HR 67 | Temp 97.9°F | Ht 62.0 in | Wt 195.0 lb

## 2020-10-15 DIAGNOSIS — I10 Essential (primary) hypertension: Secondary | ICD-10-CM

## 2020-10-16 LAB — BASIC METABOLIC PANEL
BUN/Creatinine Ratio: 20 (ref 12–28)
BUN: 21 mg/dL (ref 8–27)
CO2: 24 mmol/L (ref 20–29)
Calcium: 10.3 mg/dL (ref 8.7–10.3)
Chloride: 102 mmol/L (ref 96–106)
Creatinine, Ser: 1.07 mg/dL — ABNORMAL HIGH (ref 0.57–1.00)
Glucose: 129 mg/dL — ABNORMAL HIGH (ref 70–99)
Potassium: 4.5 mmol/L (ref 3.5–5.2)
Sodium: 143 mmol/L (ref 134–144)
eGFR: 53 mL/min/{1.73_m2} — ABNORMAL LOW (ref >59)

## 2020-10-21 NOTE — Progress Notes (Signed)
Called and spoke to pt, pt voiced understanding and has agreed to start the losartan 25mg  once daily.

## 2020-10-22 MED ORDER — LOSARTAN POTASSIUM 25 MG PO TABS
25.0000 mg | ORAL_TABLET | Freq: Every day | ORAL | 3 refills | Status: DC
Start: 2020-10-22 — End: 2021-01-15

## 2020-10-22 NOTE — Addendum Note (Signed)
Addended by: Lawerance Cruel L on: 10/22/2020 01:21 PM   Modules accepted: Orders

## 2021-01-14 NOTE — Progress Notes (Signed)
Primary Physician/Referring:  Willey Blade, MD  Patient ID: Diane Wilkerson, female    DOB: 08-13-1940, 81 y.o.   MRN: [id number]  Chief Complaint  Patient presents with   Hypertension   hld    3 months   HPI:    Diane Wilkerson  is a 81 y.o. 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  She is referred to our office by Dr. Willey Blade for evaluation of fatigue and shortness of breath.  Patient is also being evaluated by her PCP for hypercalcemia.   Notably patient was admitted 04/24/2020 - 04/27/2020 with sepsis due to urologic obstruction and pyelonephritis.   Patient presents for 80-month follow-up of hypertension and hyperlipidemia.  At last office visit given uncontrolled hypertension started losartan, however unfortunately repeat BMP has not been done.  Upon further questioning patient reports that she has never started losartan because her PCP informed her she has not tolerated well in the past.  Notably patient did have dental surgery 3 to 4 days ago is continuing to have pain, which may be contributing to elevated blood pressure today's office visit.  She has not had recent lipid profile testing done.  Denies chest pain, palpitations, dyspnea, dizziness, syncope, near syncope.  Blood pressure readings have been averaging 140s/80s mmHg according to patient.  Past Medical History:  Diagnosis Date   Essential hypertension 05/06/2019   History of kidney stones    Pulmonary sarcoidosis ([place])    Sleep apnea    Uncontrolled type 2 diabetes mellitus with hyperglycemia, without long-term current use of insulin ([place]) 05/06/2019   Past Surgical History:  Procedure Laterality Date   BREAST EXCISIONAL BIOPSY Left    CYSTOSCOPY W/ URETERAL STENT PLACEMENT Left 04/24/2020   Procedure: CYSTOSCOPY WITH RETROGRADE PYELOGRAM/URETERAL STENT PLACEMENT;  Surgeon: Janith Lima, MD;  Location: [facility];  Service: Urology;  Laterality: Left;   CYSTOSCOPY WITH STENT PLACEMENT Left 05/06/2019   Procedure: Cystoscopy with retrograde pyleogram and left stent placement, basket removal of stone foley placement;  Surgeon: Festus Aloe, MD;  Location: [facility];  Service: Urology;  Laterality: Left;   CYSTOSCOPY/URETEROSCOPY/HOLMIUM LASER/STENT PLACEMENT Left 05/13/2020   Procedure: CYSTOSCOPY/URETEROSCOPY/HOLMIUM LASER/STENT EXCHANGE;  Surgeon: Robley Fries, MD;  Location: [facility];  Service: Urology;  Laterality: Left;   TONSILLECTOMY     Family History  Problem Relation Age of Onset   Pancreatic cancer Mother    Heart disease Father    Breast cancer Sister    Cancer Sister    Diabetes Sister    Cancer Brother     Social History   Tobacco Use   Smoking status: Never   Smokeless tobacco: Never  Substance Use Topics   Alcohol use: Not Currently   Marital Status: Married   ROS  Review of Systems  Cardiovascular:  Negative for chest pain, claudication, dyspnea on exertion, leg swelling, near-syncope, orthopnea, palpitations, paroxysmal nocturnal dyspnea and syncope.  Respiratory:  Positive for snoring. Negative for shortness of breath.   Neurological:  Negative for dizziness.   Objective  Blood pressure (!) 156/83, pulse 85, temperature 98.1 F (36.7 C), temperature source Temporal, resp. rate 17, height 5\' 2"  (1.575 m), weight 189 lb (85.7 kg), SpO2 96 %.  Vitals with BMI 01/15/2021 01/15/2021 10/15/2020  Height - 5\' 2"  -  Weight - 189 lbs -  BMI - 18.84 -  Systolic 166 063 016  Diastolic  83 82 74  Pulse 85 90 67    Physical Exam Vitals reviewed.  Constitutional:      Appearance: She is obese.  Neck:     Vascular: No carotid bruit.  Cardiovascular:     Rate and Rhythm: Normal rate and regular rhythm.     Pulses: Intact distal pulses.          Carotid pulses are 2+ on the right side and 2+ on the left side.      Radial pulses are 2+ on the right  side and 2+ on the left side.       Popliteal pulses are 2+ on the right side and 2+ on the left side.       Dorsalis pedis pulses are 2+ on the right side and 2+ on the left side.       Posterior tibial pulses are 2+ on the right side and 2+ on the left side.     Heart sounds: S1 normal and S2 normal. No murmur heard.   No gallop.  Pulmonary:     Effort: Pulmonary effort is normal. No respiratory distress.     Breath sounds: No wheezing, rhonchi or rales.  Musculoskeletal:     Right lower leg: No edema.     Left lower leg: No edema.  Neurological:     Mental Status: She is alert.  Physical exam unchanged compared to previous visit.  Laboratory examination:   Recent Labs    04/27/20 0425 05/07/20 0909 05/22/20 1802 07/11/20 0827 08/29/20 0847 10/15/20 0933  NA 141 139 138 141 141 143  K 3.8 4.0 3.9 4.5 4.3 4.5  CL 105 105 105 103 102 102  CO2 28 22 24 21 22 24   GLUCOSE 129* 159* 144* 151* 134* 129*  BUN 21 19 22 19 23 21   CREATININE 1.02* 0.86 1.18* 1.03* 0.97 1.07*  CALCIUM 9.6 10.3 9.8 10.4* 10.4* 10.3  GFRNONAA 57* >60 48*  --   --   --    CrCl cannot be calculated (Patient's most recent lab result is older than the maximum 21 days allowed.).  CMP Latest Ref Rng & Units 10/15/2020 08/29/2020 07/11/2020  Glucose 70 - 99 mg/dL 129(H) 134(H) 151(H)  BUN 8 - 27 mg/dL 21 23 19   Creatinine 0.57 - 1.00 mg/dL 1.07(H) 0.97 1.03(H)  Sodium 134 - 144 mmol/L 143 141 141  Potassium 3.5 - 5.2 mmol/L 4.5 4.3 4.5  Chloride 96 - 106 mmol/L 102 102 103  CO2 20 - 29 mmol/L 24 22 21   Calcium 8.7 - 10.3 mg/dL 10.3 10.4(H) 10.4(H)  Total Protein 6.5 - 8.1 g/dL - - -  Total Bilirubin 0.3 - 1.2 mg/dL - - -  Alkaline Phos 38 - 126 U/L - - -  AST 15 - 41 U/L - - -  ALT 0 - 44 U/L - - -   CBC Latest Ref Rng & Units 05/22/2020 05/07/2020 04/27/2020  WBC 4.0 - 10.5 K/uL 7.0 5.0 8.0  Hemoglobin 12.0 - 15.0 g/dL 12.6 11.5(L) 9.8(L)  Hematocrit 36.0 - 46.0 % 40.6 35.7(L) 30.8(L)  Platelets 150 -  400 K/uL 426(H) 433(H) 312    Lipid Panel No results for input(s): CHOL, TRIG, LDLCALC, VLDL, HDL, CHOLHDL, LDLDIRECT in the last 8760 hours.  HEMOGLOBIN A1C Lab Results  Component Value Date   HGBA1C 6.6 (H) 04/25/2020   MPG 142.72 04/25/2020   TSH Recent Labs    03/19/20 1755  TSH 2.278    External  labs:   03/08/2020: TSH 2.16, free T3 2.4 (normal) free T4 1.21 (normal) A1c 7.1% Hgb 12.1, HCT 37.0, MCV 83, platelet 382 Glucose 150, BUN 29, creatinine 1.41, GFR 38, sodium 139, potassium 4.3, AST 22, ALT 27  Allergies  No Known Allergies   Medications Prior to Visit:   Outpatient Medications Prior to Visit  Medication Sig Dispense Refill   acetaminophen (TYLENOL) 325 MG tablet Take 2 tablets (650 mg total) by mouth every 6 (six) hours as needed for mild pain, fever or headache.     amLODipine (NORVASC) 10 MG tablet Take 10 mg by mouth daily.     Dulaglutide (TRULICITY) 1.5 JJ/8.8CZ SOPN Inject 1.5 mg into the skin once a week.     Finerenone (KERENDIA) 10 MG TABS Take 1 tablet by mouth daily.     glipizide-metformin (METAGLIP) 2.5-250 MG tablet Take 1 tablet by mouth 2 (two) times daily.     metoprolol succinate (TOPROL-XL) 50 MG 24 hr tablet Take 1 tablet (50 mg total) by mouth every evening. Take with or immediately following a meal. 90 tablet 3   losartan (COZAAR) 25 MG tablet Take 1 tablet (25 mg total) by mouth daily. 90 tablet 3   No facility-administered medications prior to visit.   Final Medications at End of Visit    Current Meds  Medication Sig   acetaminophen (TYLENOL) 325 MG tablet Take 2 tablets (650 mg total) by mouth every 6 (six) hours as needed for mild pain, fever or headache.   amLODipine (NORVASC) 10 MG tablet Take 10 mg by mouth daily.   Dulaglutide (TRULICITY) 1.5 YS/0.6TK SOPN Inject 1.5 mg into the skin once a week.   Finerenone (KERENDIA) 10 MG TABS Take 1 tablet by mouth daily.   glipizide-metformin (METAGLIP) 2.5-250 MG tablet Take 1  tablet by mouth 2 (two) times daily.   [DISCONTINUED] metoprolol succinate (TOPROL-XL) 50 MG 24 hr tablet Take 1 tablet (50 mg total) by mouth every evening. Take with or immediately following a meal.   Radiology:   No results found.  Cardiac Studies:   Echocardiogram 07/15/2020:  Left ventricle cavity is normal in size. Moderate concentric hypertrophy  of the left ventricle. Normal global wall motion. Normal LV systolic function with EF 65%. Indeterminate diastolic filling pattern due to E/A fusion.  No significant valvular abnormality.  IVC not seen.  Exercise Myoview stress test 07/30/2020: 1 Day Rest/Stress Protocol. Exercise time 1 minute 46 seconds on Bruce protocol, achieved 4.06 METS, and 104% age-predicted maximum heart rate.  Poor functional capacity for age, at the current workload stress ECG negative for ischemia. Medium size, mild intensity fixed perfusion defect due to diaphragmatic attenuation.  Otherwise normal myocardial perfusion without evidence of reversible ischemia or prior infarct. LVEF per gated SPECT 57%, normal wall motion, and normal ventricular size. At the current workload overall a low risk study.   Clinical correlation required due to poor functional capacity and tachycardia at baseline.  EKG:  01/15/2021: Sinus rhythm at a rate of 84 bpm.  Normal axis.  Left atrial enlargement.  No evidence of ischemia or underlying injury pattern.  07/09/2020: Sinus tachycardia at a rate of 104 bpm.  Left atrial enlargement.  Normal axis.  Incomplete right bundle branch block.  No evidence of ischemia or underlying injury pattern.  Compared EKG 05/23/2020, no significant change.  05/23/2020: Sinus tachycardia at a rate of 102 bpm.  Left atrial enlargement.  Normal axis.  Incomplete right bundle branch block.  Assessment  ICD-10-CM   1. Essential hypertension  I10 EKG 12-Lead    metoprolol succinate (TOPROL-XL) 100 MG 24 hr tablet    Basic metabolic panel    2.  Hypercholesterolemia  E78.00 Lipid Panel With LDL/HDL Ratio       Medications Discontinued During This Encounter  Medication Reason   losartan (COZAAR) 25 MG tablet    metoprolol succinate (TOPROL-XL) 50 MG 24 hr tablet     Meds ordered this encounter  Medications   metoprolol succinate (TOPROL-XL) 100 MG 24 hr tablet    Sig: Take 1 tablet (100 mg total) by mouth every evening. Take with or immediately following a meal.    Dispense:  90 tablet    Refill:  3     Recommendations:   Diane Wilkerson is a 81 y.o. 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  She is referred to our office by Dr. Willey Blade for evaluation of fatigue and shortness of breath.  Patient presents for 85-month follow-up of hypertension and hyperlipidemia.  At last office visit given uncontrolled hypertension started losartan, however unfortunately repeat BMP has not been done. Patient however did not start losartan as her PCP advised against it. Apparently in the past patient has not tolerated losartan, but the reason is unclear.  Patient's blood pressure remains mildly elevated, therefore will increase metoprolol from 50 mg to 100 mg daily.  There is no recent lipid profile for review, therefore I will order at this time.  EKG and physical exam remained stable.  Patient is asymptomatic and stable from a cardiovascular standpoint.  She is continued with significant diet changes and is congratulated on her 6 pound weight loss.  Patient is encouraged to continue to focus on weight loss and reducing sodium intake in her diet.  Follow-up in 6 months, sooner if needed for hypertension, hyperlipidemia.   Alethia Berthold, PA-C 01/15/2021, 9:57 AM Office: [phone]

## 2021-01-15 ENCOUNTER — Encounter: Payer: Self-pay | Admitting: Student

## 2021-01-15 ENCOUNTER — Other Ambulatory Visit: Payer: Self-pay

## 2021-01-15 ENCOUNTER — Ambulatory Visit: Payer: 59 | Admitting: Student

## 2021-01-15 VITALS — BP 156/83 | HR 85 | Temp 98.1°F | Resp 17 | Ht 62.0 in | Wt 189.0 lb

## 2021-01-15 DIAGNOSIS — E7800 Pure hypercholesterolemia, unspecified: Secondary | ICD-10-CM

## 2021-01-15 DIAGNOSIS — I10 Essential (primary) hypertension: Secondary | ICD-10-CM

## 2021-01-15 MED ORDER — METOPROLOL SUCCINATE ER 100 MG PO TB24: 100.0000 mg | ORAL_TABLET | Freq: Every evening | ORAL | 3 refills | Status: DC

## 2021-01-16 LAB — BASIC METABOLIC PANEL
BUN/Creatinine Ratio: 12 (ref 12–28)
BUN: 13 mg/dL (ref 8–27)
CO2: 23 mmol/L (ref 20–29)
Calcium: 10.3 mg/dL (ref 8.7–10.3)
Chloride: 104 mmol/L (ref 96–106)
Creatinine, Ser: 1.07 mg/dL — ABNORMAL HIGH (ref 0.57–1.00)
Glucose: 114 mg/dL — ABNORMAL HIGH (ref 70–99)
Potassium: 4.1 mmol/L (ref 3.5–5.2)
Sodium: 144 mmol/L (ref 134–144)
eGFR: 53 mL/min/{1.73_m2} — ABNORMAL LOW (ref >59)

## 2021-01-16 LAB — LIPID PANEL WITH LDL/HDL RATIO
Cholesterol, Total: 246 mg/dL — ABNORMAL HIGH (ref 100–199)
HDL: 79 mg/dL (ref >39)
LDL Chol Calc (NIH): 146 mg/dL — ABNORMAL HIGH (ref 0–99)
LDL/HDL Ratio: 1.8 ratio (ref 0.0–3.2)
Triglycerides: 119 mg/dL (ref 0–149)
VLDL Cholesterol Cal: 21 mg/dL (ref 5–40)

## 2021-01-19 NOTE — Addendum Note (Signed)
Addended by: Lawerance Cruel L on: 01/19/2021 02:07 PM   Modules accepted: Orders

## 2021-07-15 ENCOUNTER — Ambulatory Visit: Payer: 59 | Admitting: Student

## 2021-07-21 ENCOUNTER — Encounter: Payer: Self-pay | Admitting: Student

## 2021-07-21 ENCOUNTER — Ambulatory Visit: Payer: 59 | Admitting: Student

## 2021-07-21 VITALS — BP 162/77 | HR 72 | Temp 98.0°F | Resp 16 | Ht 62.0 in | Wt 192.4 lb

## 2021-07-21 DIAGNOSIS — E7800 Pure hypercholesterolemia, unspecified: Secondary | ICD-10-CM

## 2021-07-21 DIAGNOSIS — I10 Essential (primary) hypertension: Secondary | ICD-10-CM

## 2021-07-21 MED ORDER — HYDROCHLOROTHIAZIDE 12.5 MG PO CAPS: 12.5000 mg | ORAL_CAPSULE | Freq: Every day | ORAL | 3 refills | Status: DC

## 2021-07-21 NOTE — Progress Notes (Unsigned)
Primary Physician/Referring:  Willey Blade, MD  Patient ID: Diane Wilkerson, female    DOB: 1940-02-21, 81 y.o.   MRN: [id number]  No chief complaint on file.  HPI:    Diane Wilkerson  is a 81 y.o. 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  She is referred to our office by Dr. Willey Blade for evaluation of fatigue and shortness of breath.  Patient is also being evaluated by her PCP for hypercalcemia.   Patient was last seen in the office 01/15/2021 at which time blood pressure remained mildly elevated, therefore increase metoprolol from 50 mg to 100 mg daily and ordered repeat lipid profile testing.  Pete lipid profile testing after last visit revealed LDL of 146, again recommended statin therapy but patient opted out of this and instead she was to focus on diet and lifestyle modifications.  She has not had repeat lipid profile testing done since making these changes. Patient now presents for 6 month follow up.  Blood pressure readings at home continue to average 470J/628Z mmHg systolic.  She reports making diet changes to cut back on high fatty foods and salt, however continues to struggle with this.  Denies chest pain, palpitations, dyspnea, dizziness, syncope, near syncope.   Past Medical History:  Diagnosis Date   Essential hypertension 05/06/2019   History of kidney stones    Pulmonary sarcoidosis ([place])    Sleep apnea    Uncontrolled type 2 diabetes mellitus with hyperglycemia, without long-term current use of insulin ([place]) 05/06/2019   Past Surgical History:  Procedure Laterality Date   BREAST EXCISIONAL BIOPSY Left    CYSTOSCOPY W/ URETERAL STENT PLACEMENT Left 04/24/2020   Procedure: CYSTOSCOPY WITH RETROGRADE PYELOGRAM/URETERAL STENT PLACEMENT;  Surgeon: Janith Lima, MD;  Location: [facility];  Service: Urology;  Laterality: Left;   CYSTOSCOPY WITH STENT  PLACEMENT Left 05/06/2019   Procedure: Cystoscopy with retrograde pyleogram and left stent placement, basket removal of stone foley placement;  Surgeon: Festus Aloe, MD;  Location: [facility];  Service: Urology;  Laterality: Left;   CYSTOSCOPY/URETEROSCOPY/HOLMIUM LASER/STENT PLACEMENT Left 05/13/2020   Procedure: CYSTOSCOPY/URETEROSCOPY/HOLMIUM LASER/STENT EXCHANGE;  Surgeon: Robley Fries, MD;  Location: [facility];  Service: Urology;  Laterality: Left;   TONSILLECTOMY     Family History  Problem Relation Age of Onset   Pancreatic cancer Mother    Heart disease Father    Breast cancer Sister    Cancer Sister    Diabetes Sister    Cancer Brother     Social History   Tobacco Use   Smoking status: Never   Smokeless tobacco: Never  Substance Use Topics   Alcohol use: Not Currently   Marital Status: Married   ROS  Review of Systems  Cardiovascular:  Negative for chest pain, claudication, dyspnea on exertion, leg swelling, near-syncope, orthopnea, palpitations, paroxysmal nocturnal dyspnea and syncope.  Respiratory:  Positive for snoring. Negative for shortness of breath.   Neurological:  Negative for dizziness.    Objective  There were no vitals taken for this visit.     01/15/2021    8:46 AM 01/15/2021    8:35 AM 10/15/2020    8:40 AM  Vitals with BMI  Height  '5\' 2"'$    Weight  189 lbs   BMI  66.29   Systolic 476 546 503  Diastolic 83 82 74  Pulse 85 90 67    Physical Exam  Vitals reviewed.  Constitutional:      Appearance: She is obese.  Neck:     Vascular: No carotid bruit.  Cardiovascular:     Rate and Rhythm: Normal rate and regular rhythm.     Pulses: Intact distal pulses.          Carotid pulses are 2+ on the right side and 2+ on the left side.      Radial pulses are 2+ on the right side and 2+ on the left side.       Popliteal pulses are 2+ on the right side and 2+ on the left side.       Dorsalis pedis pulses are 2+ on the right side and 2+ on the left  side.       Posterior tibial pulses are 2+ on the right side and 2+ on the left side.     Heart sounds: S1 normal and S2 normal. No murmur heard.    No gallop.  Pulmonary:     Effort: Pulmonary effort is normal. No respiratory distress.     Breath sounds: No wheezing, rhonchi or rales.  Musculoskeletal:     Right lower leg: No edema.     Left lower leg: No edema.  Neurological:     Mental Status: She is alert.   Physical exam unchanged compared to previous visit.  Laboratory examination:   Recent Labs    08/29/20 0847 10/15/20 0933 01/15/21 0919  NA 141 143 144  K 4.3 4.5 4.1  CL 102 102 104  CO2 '22 24 23  '$ GLUCOSE 134* 129* 114*  BUN '23 21 13  '$ CREATININE 0.97 1.07* 1.07*  CALCIUM 10.4* 10.3 10.3    CrCl cannot be calculated (Patient's most recent lab result is older than the maximum 21 days allowed.).     Latest Ref Rng & Units 01/15/2021    9:19 AM 10/15/2020    9:33 AM 08/29/2020    8:47 AM  CMP  Glucose 70 - 99 mg/dL 114  129  134   BUN 8 - 27 mg/dL '13  21  23   '$ Creatinine 0.57 - 1.00 mg/dL 1.07  1.07  0.97   Sodium 134 - 144 mmol/L 144  143  141   Potassium 3.5 - 5.2 mmol/L 4.1  4.5  4.3   Chloride 96 - 106 mmol/L 104  102  102   CO2 20 - 29 mmol/L '23  24  22   '$ Calcium 8.7 - 10.3 mg/dL 10.3  10.3  10.4       Latest Ref Rng & Units 05/22/2020    6:02 PM 05/07/2020    9:09 AM 04/27/2020    4:25 AM  CBC  WBC 4.0 - 10.5 K/uL 7.0  5.0  8.0   Hemoglobin 12.0 - 15.0 g/dL 12.6  11.5  9.8   Hematocrit 36.0 - 46.0 % 40.6  35.7  30.8   Platelets 150 - 400 K/uL 426  433  312     Lipid Panel Recent Labs    01/15/21 0919  CHOL 246*  TRIG 119  LDLCALC 146*  HDL 79    HEMOGLOBIN A1C Lab Results  Component Value Date   HGBA1C 6.6 (H) 04/25/2020   MPG 142.72 04/25/2020   TSH No results for input(s): "TSH" in the last 8760 hours.   External labs:   03/08/2020: TSH 2.16, free T3 2.4 (normal) free T4 1.21 (normal) A1c 7.1% Hgb 12.1, HCT 37.0, MCV 83,  platelet 382  Glucose 150, BUN 29, creatinine 1.41, GFR 38, sodium 139, potassium 4.3, AST 22, ALT 27  Allergies  No Known Allergies   Medications Prior to Visit:   Outpatient Medications Prior to Visit  Medication Sig Dispense Refill   acetaminophen (TYLENOL) 325 MG tablet Take 2 tablets (650 mg total) by mouth every 6 (six) hours as needed for mild pain, fever or headache.     amLODipine (NORVASC) 10 MG tablet Take 10 mg by mouth daily.     Dulaglutide (TRULICITY) 1.5 DP/8.2UM SOPN Inject 1.5 mg into the skin once a week.     Finerenone (KERENDIA) 10 MG TABS Take 1 tablet by mouth daily.     glipizide-metformin (METAGLIP) 2.5-250 MG tablet Take 1 tablet by mouth 2 (two) times daily.     metoprolol succinate (TOPROL-XL) 100 MG 24 hr tablet Take 1 tablet (100 mg total) by mouth every evening. Take with or immediately following a meal. 90 tablet 3   No facility-administered medications prior to visit.   Final Medications at End of Visit    No outpatient medications have been marked as taking for the 07/21/21 encounter (Appointment) with Rayetta Pigg, Cosimo Schertzer C, PA-Diane.   Radiology:   No results found.  Cardiac Studies:   Echocardiogram 07/15/2020:  Left ventricle cavity is normal in size. Moderate concentric hypertrophy  of the left ventricle. Normal global wall motion. Normal LV systolic function with EF 65%. Indeterminate diastolic filling pattern due to E/A fusion.  No significant valvular abnormality.  IVC not seen.  Exercise Myoview stress test 07/30/2020: 1 Day Rest/Stress Protocol. Exercise time 1 minute 46 seconds on Bruce protocol, achieved 4.06 METS, and 104% age-predicted maximum heart rate.  Poor functional capacity for age, at the current workload stress ECG negative for ischemia. Medium size, mild intensity fixed perfusion defect due to diaphragmatic attenuation.  Otherwise normal myocardial perfusion without evidence of reversible ischemia or prior infarct. LVEF per gated  SPECT 57%, normal wall motion, and normal ventricular size. At the current workload overall a low risk study.   Clinical correlation required due to poor functional capacity and tachycardia at baseline.  EKG:  ***  01/15/2021: Sinus rhythm at a rate of 84 bpm.  Normal axis.  Left atrial enlargement.  No evidence of ischemia or underlying injury pattern.  07/09/2020: Sinus tachycardia at a rate of 104 bpm.  Left atrial enlargement.  Normal axis.  Incomplete right bundle branch block.  No evidence of ischemia or underlying injury pattern.  Compared EKG 05/23/2020, no significant change.  05/23/2020: Sinus tachycardia at a rate of 102 bpm.  Left atrial enlargement.  Normal axis.  Incomplete right bundle branch block.  Assessment   No diagnosis found.    There are no discontinued medications.   No orders of the defined types were placed in this encounter.    Recommendations:   Diane Wilkerson is a 81 y.o. 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  She is referred to our office by Dr. Willey Blade for evaluation of fatigue and shortness of breath.  Patient was last seen in the office 01/15/2021 at which time blood pressure remained mildly elevated, therefore increase metoprolol from 50 mg to 100 mg daily and ordered repeat lipid profile testing.  Pete lipid profile testing after last visit revealed LDL of 146, again recommended statin therapy but patient opted out of this and instead she was  to focus on diet and lifestyle modifications.  She has not had repeat lipid profile testing done since making these changes. Patient now presents for 6 month follow up. ***  ***Diet changes? Needs lipid panel.   Patient presents for 63-monthfollow-up of hypertension and hyperlipidemia.  At last office visit given uncontrolled hypertension started losartan, however  unfortunately repeat BMP has not been done. Patient however did not start losartan as her PCP advised against it. Apparently in the past patient has not tolerated losartan, but the reason is unclear.  Patient's blood pressure remains mildly elevated, therefore will increase metoprolol from 50 mg to 100 mg daily.  There is no recent lipid profile for review, therefore I will order at this time.  EKG and physical exam remained stable.  Patient is asymptomatic and stable from a cardiovascular standpoint.  She is continued with significant diet changes and is congratulated on her 6 pound weight loss.  Patient is encouraged to continue to focus on weight loss and reducing sodium intake in her diet.  Follow-up in 6 months, sooner if needed for hypertension, hyperlipidemia.   CAlethia Berthold PA-Diane 07/21/2021, 8:29 AM Office: [phone]

## 2021-08-04 LAB — LIPID PANEL WITH LDL/HDL RATIO
Cholesterol, Total: 241 mg/dL — ABNORMAL HIGH (ref 100–199)
HDL: 83 mg/dL (ref >39)
LDL Chol Calc (NIH): 134 mg/dL — ABNORMAL HIGH (ref 0–99)
LDL/HDL Ratio: 1.6 ratio (ref 0.0–3.2)
Triglycerides: 137 mg/dL (ref 0–149)
VLDL Cholesterol Cal: 24 mg/dL (ref 5–40)

## 2021-08-04 LAB — BASIC METABOLIC PANEL
BUN/Creatinine Ratio: 23 (ref 12–28)
BUN: 24 mg/dL (ref 8–27)
CO2: 25 mmol/L (ref 20–29)
Calcium: 10.4 mg/dL — ABNORMAL HIGH (ref 8.7–10.3)
Chloride: 101 mmol/L (ref 96–106)
Creatinine, Ser: 1.04 mg/dL — ABNORMAL HIGH (ref 0.57–1.00)
Glucose: 115 mg/dL — ABNORMAL HIGH (ref 70–99)
Potassium: 4.6 mmol/L (ref 3.5–5.2)
Sodium: 141 mmol/L (ref 134–144)
eGFR: 55 mL/min/{1.73_m2} — ABNORMAL LOW (ref >59)

## 2021-08-12 ENCOUNTER — Other Ambulatory Visit: Payer: Self-pay | Admitting: Obstetrics and Gynecology

## 2021-08-12 DIAGNOSIS — M810 Age-related osteoporosis without current pathological fracture: Secondary | ICD-10-CM

## 2021-10-21 ENCOUNTER — Ambulatory Visit: Payer: Medicare Other | Admitting: Cardiology

## 2021-10-21 ENCOUNTER — Encounter: Payer: Self-pay | Admitting: Cardiology

## 2021-10-21 VITALS — BP 149/76 | HR 78 | Temp 98.5°F | Resp 16 | Ht 62.0 in | Wt 193.6 lb

## 2021-10-21 DIAGNOSIS — E7800 Pure hypercholesterolemia, unspecified: Secondary | ICD-10-CM

## 2021-10-21 DIAGNOSIS — I10 Essential (primary) hypertension: Secondary | ICD-10-CM

## 2021-10-21 DIAGNOSIS — R0609 Other forms of dyspnea: Secondary | ICD-10-CM

## 2021-10-21 MED ORDER — LOSARTAN POTASSIUM-HCTZ 50-12.5 MG PO TABS: 1.0000 | ORAL_TABLET | Freq: Every morning | ORAL | 2 refills | Status: DC

## 2021-10-21 MED ORDER — ATORVASTATIN CALCIUM 10 MG PO TABS: 10.0000 mg | ORAL_TABLET | Freq: Every day | ORAL | 3 refills | Status: DC

## 2021-10-21 NOTE — Progress Notes (Signed)
Primary Physician/Referring:  Willey Blade, MD  Patient ID: Jeananne Wilkerson, female    DOB: [birth year], 81 y.o.   MRN: [id number]  Chief Complaint  Patient presents with   Hypertension   Hyperlipidemia   Follow-up    3 months   HPI:    Diane Wilkerson  is a 81 y.o. 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   Patient states that since being on blood pressure medications, her dyspnea has improved.  Blood pressure is also improved but does not check it often.  She denies chest pain, palpitations, dizziness or syncope.  Tolerating all medications well.     Past Medical History:  Diagnosis Date   Complicated UTI (urinary tract infection) 05/07/2019   History of kidney stones    Hyperlipidemia    Hypertension    Pulmonary sarcoidosis ([place])    Sleep apnea    Uncontrolled type 2 diabetes mellitus with hyperglycemia, without long-term current use of insulin ([place]) 05/06/2019   Past Surgical History:  Procedure Laterality Date   BREAST EXCISIONAL BIOPSY Left    CYSTOSCOPY W/ URETERAL STENT PLACEMENT Left 04/24/2020   Procedure: CYSTOSCOPY WITH RETROGRADE PYELOGRAM/URETERAL STENT PLACEMENT;  Surgeon: Janith Lima, MD;  Location: [facility];  Service: Urology;  Laterality: Left;   CYSTOSCOPY WITH STENT PLACEMENT Left 05/06/2019   Procedure: Cystoscopy with retrograde pyleogram and left stent placement, basket removal of stone foley placement;  Surgeon: Festus Aloe, MD;  Location[facility];  Service: Urology;  Laterality: Left;   CYSTOSCOPY/URETEROSCOPY/HOLMIUM LASER/STENT PLACEMENT Left 05/13/2020   Procedure: CYSTOSCOPY/URETEROSCOPY/HOLMIUM LASER/STENT EXCHANGE;  Surgeon: Robley Fries, MD;  Location[facility];  Service: Urology;  Laterality: Left;   TONSILLECTOMY     Family History  Problem Relation Age of Onset   Pancreatic cancer Mother    Heart disease  Father    Breast cancer Sister    Cancer Sister    Diabetes Sister    Cancer Brother     Social History   Tobacco Use   Smoking status: Never   Smokeless tobacco: Never  Substance Use Topics   Alcohol use: Not Currently   Marital Status: Married   ROS  Review of Systems  Cardiovascular:  Positive for dyspnea on exertion. Negative for chest pain and leg swelling.  Respiratory:  Positive for snoring.     Objective  Blood pressure (!) 149/76, pulse 78, temperature 98.5 F (36.9 C), temperature source Temporal, resp. rate 16, height _0  (1.575 m), weight 193 lb 9.6 oz (87.8 kg), SpO2 97 %.     10/21/2021    8:54 AM 07/21/2021    9:01 AM 01/15/2021    8:46 AM  Vitals with BMI  Height _1  _2    Weight 193 lbs 10 oz 192 lbs 6 oz   BMI 92.3 30.07   Systolic 622 633 354  Diastolic 76 77 83  Pulse 78 72 85    Physical Exam Constitutional:      Appearance: She is obese.  Neck:     Vascular: No carotid bruit or JVD.  Cardiovascular:     Rate and Rhythm: Normal rate and regular rhythm.     Pulses: Intact distal pulses.     Heart sounds: Normal heart sounds. No murmur heard.    No gallop.  Pulmonary:     Effort: Pulmonary effort is normal.     Breath sounds: Normal  breath sounds.  Abdominal:     General: Bowel sounds are normal.     Palpations: Abdomen is soft.  Musculoskeletal:     Right lower leg: No edema.     Left lower leg: No edema.     Laboratory examination:   Lab Results  Component Value Date   NA 141 08/03/2021   K 4.6 08/03/2021   CO2 25 08/03/2021   GLUCOSE 115 (H) 08/03/2021   BUN 24 08/03/2021   CREATININE 1.04 (H) 08/03/2021   CALCIUM 10.4 (H) 08/03/2021   EGFR 55 (L) 08/03/2021   GFRNONAA 48 (L) 05/22/2020    CrCl cannot be calculated (Patient's most recent lab result is older than the maximum 21 days allowed.).     Latest Ref Rng & Units 08/03/2021    8:11 AM 01/15/2021    9:19 AM 10/15/2020    9:33 AM  CMP  Glucose 70 - 99 mg/dL  115  114  129   BUN 8 - 27 mg/dL _0 Creatinine 0.57 - 1.00 mg/dL 1.04  1.07  1.07   Sodium 134 - 144 mmol/L 141  144  143   Potassium 3.5 - 5.2 mmol/L 4.6  4.1  4.5   Chloride 96 - 106 mmol/L 101  104  102   CO2 20 - 29 mmol/L _1 Calcium 8.7 - 10.3 mg/dL 10.4  10.3  10.3       Latest Ref Rng & Units 05/22/2020    6:02 PM 05/07/2020    9:09 AM 04/27/2020    4:25 AM  CBC  WBC 4.0 - 10.5 K/uL 7.0  5.0  8.0   Hemoglobin 12.0 - 15.0 g/dL 12.6  11.5  9.8   Hematocrit 36.0 - 46.0 % 40.6  35.7  30.8   Platelets 150 - 400 K/uL 426  433  312     Lipid Panel Recent Labs    01/15/21 0919 08/03/21 0811  CHOL 246* 241*  TRIG 119 137  LDLCALC 146* 134*  HDL 79 83    HEMOGLOBIN A1C Lab Results  Component Value Date   HGBA1C 6.6 (H) 04/25/2020   MPG 142.72 04/25/2020   TSH No results for input(s): "TSH" in the last 8760 hours.   External labs:   Allergies  No Known Allergies    Final Medications at End of Visit     Current Outpatient Medications:    acetaminophen (TYLENOL) 325 MG tablet, Take 2 tablets (650 mg total) by mouth every 6 (six) hours as needed for mild pain, fever or headache., Disp: , Rfl:    amLODipine (NORVASC) 10 MG tablet, Take 10 mg by mouth daily., Disp: , Rfl:    atorvastatin (LIPITOR) 10 MG tablet, Take 1 tablet (10 mg total) by mouth daily., Disp: 90 tablet, Rfl: 3   Finerenone (KERENDIA) 10 MG TABS, Take 1 tablet by mouth daily., Disp: , Rfl:    glipizide-metformin (METAGLIP) 2.5-250 MG tablet, Take 1 tablet by mouth 2 (two) times daily., Disp: , Rfl:    losartan-hydrochlorothiazide (HYZAAR) 50-12.5 MG tablet, Take 1 tablet by mouth every morning., Disp: 30 tablet, Rfl: 2   metoprolol succinate (TOPROL-XL) 100 MG 24 hr tablet, Take 1 tablet (100 mg total) by mouth every evening. Take with or immediately following a meal., Disp: 90 tablet, Rfl: 3   Radiology:   No results found.  Cardiac Studies:   Echocardiogram 07/15/2020:  Left  ventricle cavity is  normal in size. Moderate concentric hypertrophy  of the left ventricle. Normal global wall motion. Normal LV systolic function with EF 65%. Indeterminate diastolic filling pattern due to E/A fusion.  No significant valvular abnormality.  IVC not seen.  Exercise Myoview stress test 07/30/2020: 1 Day Rest/Stress Protocol. Exercise time 1 minute 46 seconds on Bruce protocol, achieved 4.06 METS, and 104% age-predicted maximum heart rate.  Poor functional capacity for age, at the current workload stress ECG negative for ischemia. Medium size, mild intensity fixed perfusion defect due to diaphragmatic attenuation.  Otherwise normal myocardial perfusion without evidence of reversible ischemia or prior infarct. LVEF per gated SPECT 57%, normal wall motion, and normal ventricular size. At the current workload overall a low risk study.   Clinical correlation required due to poor functional capacity and tachycardia at baseline.  EKG:   07/22/2021: Sinus cardia at a rate of 54 bpm.  Normal axis.  No evidence of ischemia or underlying injury pattern.  Unchanged compared to EKG 01/15/2021.  07/09/2020: Sinus tachycardia at a rate of 104 bpm.  Left atrial enlargement.  Normal axis.  Incomplete right bundle branch block.  No evidence of ischemia or underlying injury pattern.  Compared EKG 05/23/2020, no significant change.   Assessment     ICD-10-CM   1. Primary hypertension  H47 Basic metabolic panel    2. Dyspnea on exertion  R06.09     3. Hypercholesterolemia  E78.00 atorvastatin (LIPITOR) 10 MG tablet    Lipid Panel With LDL/HDL Ratio       Medications Discontinued During This Encounter  Medication Reason   hydrochlorothiazide (MICROZIDE) 12.5 MG capsule Change in therapy    Meds ordered this encounter  Medications   atorvastatin (LIPITOR) 10 MG tablet    Sig: Take 1 tablet (10 mg total) by mouth daily.    Dispense:  90 tablet    Refill:  3   losartan-hydrochlorothiazide  (HYZAAR) 50-12.5 MG tablet    Sig: Take 1 tablet by mouth every morning.    Dispense:  30 tablet    Refill:  2    Recommendations:   Diane Wilkerson is a 81 y.o. AA female with history of hypertension, hyperlipidemia, type 2 diabetes mellitus with stage 3a chronic kidney disease, pulmonary sarcoidosis (diagnosed in the 20s), vitamin D deficiency, former smoker.   Patient is presently tolerating 100 mg of metoprolol succinate, she is taking two 50 mg tablets.  Blood pressure is still elevated but much improved, states that she is being on the above medications, her shortness of breath has remarkably improved.  She has been getting at least 3000-4000 steps a day.  I would like to add losartan HCT and stop plain HCTZ 50/12.5 mg in the morning, obtain BMP in 2 weeks.   Patient did not want to be on statin.  I discussed with her regarding high risk for cardio vascular events in view of diabetes mellitus, she is willing to start low-dose statin, Lipitor 10 mg will be started.  She will obtain lipid profile testing in 6 weeks.  I will see her back after that.  I will enroll her in [facility] for hypertension management.     Adrian Prows, MD, [facility] 10/21/2021, 9:39 AM Office: [phone] Fax: [phone] Pager: [phone]

## 2021-10-28 ENCOUNTER — Telehealth: Payer: Self-pay

## 2021-10-28 NOTE — Telephone Encounter (Signed)
Patient is tolerating the medication without side effects, blood pressure Controlled.  Continue monitoring.

## 2021-10-28 NOTE — Telephone Encounter (Signed)
1-week follow-up to new start BP monitoring through [facility]. Patient tolerating medication change well. She will ger labs mid-next week.  Current Meds  Medication Sig   amLODipine (NORVASC) 10 MG tablet Take 10 mg by mouth daily.   losartan-hydrochlorothiazide (HYZAAR) 50-12.5 MG tablet Take 1 tablet by mouth every morning.   metoprolol succinate (TOPROL-XL) 100 MG 24 hr tablet Take 1 tablet (100 mg total) by mouth every evening. Take with or immediately following a meal.    Average Systolic BP Level 827.07 mmHg Lowest Systolic BP Level 867 mmHg Highest Systolic BP Level 544 mmHg  10/27/2021 Tuesday at 11:19 AM 120 / 65      10/26/2021 Monday at 11:03 AM 125 / 69      10/25/2021 'Sunday at 11:02 AM 135 / 73      10/24/2021 Saturday at 11:21 AM 122 / 69      10/23/2021 Friday at 11:08 AM 124 / 73      10/22/2021 Thursday at 10:55 AM 131 / 79      10/21/2021 Wednesday at 09:52 AM 139 / 87      10'$ /18/2023 Wednesday at 09:48 AM 137 / 86

## 2021-11-06 LAB — BASIC METABOLIC PANEL
BUN/Creatinine Ratio: 26 (ref 12–28)
BUN: 30 mg/dL — ABNORMAL HIGH (ref 8–27)
CO2: 25 mmol/L (ref 20–29)
Calcium: 10.3 mg/dL (ref 8.7–10.3)
Chloride: 102 mmol/L (ref 96–106)
Creatinine, Ser: 1.14 mg/dL — ABNORMAL HIGH (ref 0.57–1.00)
Glucose: 119 mg/dL — ABNORMAL HIGH (ref 70–99)
Potassium: 4.4 mmol/L (ref 3.5–5.2)
Sodium: 142 mmol/L (ref 134–144)
eGFR: 49 mL/min/{1.73_m2} — ABNORMAL LOW (ref >59)

## 2021-11-29 ENCOUNTER — Other Ambulatory Visit: Payer: Self-pay | Admitting: Cardiology

## 2021-11-29 DIAGNOSIS — E7800 Pure hypercholesterolemia, unspecified: Secondary | ICD-10-CM

## 2021-12-04 ENCOUNTER — Other Ambulatory Visit: Payer: Self-pay

## 2021-12-04 DIAGNOSIS — N1831 Chronic kidney disease, stage 3a: Secondary | ICD-10-CM

## 2021-12-04 MED ORDER — KERENDIA 10 MG PO TABS: 1.0000 | ORAL_TABLET | Freq: Every day | ORAL | 3 refills | Status: DC

## 2021-12-04 NOTE — Telephone Encounter (Signed)
Printing prescription for [employer] Patient Assistance Attestation Form to re-enroll

## 2021-12-16 LAB — LIPID PANEL WITH LDL/HDL RATIO
Cholesterol, Total: 247 mg/dL — ABNORMAL HIGH (ref 100–199)
HDL: 77 mg/dL (ref >39)
LDL Chol Calc (NIH): 121 mg/dL — ABNORMAL HIGH (ref 0–99)
LDL/HDL Ratio: 1.6 ratio (ref 0.0–3.2)
Triglycerides: 282 mg/dL — ABNORMAL HIGH (ref 0–149)
VLDL Cholesterol Cal: 49 mg/dL — ABNORMAL HIGH (ref 5–40)

## 2021-12-17 ENCOUNTER — Telehealth: Payer: Self-pay

## 2021-12-17 ENCOUNTER — Ambulatory Visit: Payer: Medicare Other | Admitting: Cardiology

## 2021-12-17 ENCOUNTER — Encounter: Payer: Self-pay | Admitting: Cardiology

## 2021-12-17 VITALS — BP 130/75 | HR 80 | Resp 16 | Ht 62.0 in | Wt 194.8 lb

## 2021-12-17 DIAGNOSIS — R0609 Other forms of dyspnea: Secondary | ICD-10-CM

## 2021-12-17 DIAGNOSIS — E7800 Pure hypercholesterolemia, unspecified: Secondary | ICD-10-CM

## 2021-12-17 DIAGNOSIS — I10 Essential (primary) hypertension: Secondary | ICD-10-CM

## 2021-12-17 NOTE — Progress Notes (Signed)
Primary Physician/Referring:  Willey Blade, MD  Patient ID: Diane Wilkerson, female    DOB: October 08, 1940, 81 y.o.   MRN: [id number]  Chief Complaint  Patient presents with   Hypertension   Follow-up    8 weeks   HPI:    DAILY DOE  is a 81 y.o. 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  Patient presents for 8-week follow-up visit for hypertension specifically and also hyperlipidemia.  Patient states that since being on blood pressure medications, her dyspnea has improved.  She denies chest pain, palpitations, dizziness or syncope.  At her last office visit after long discussion, I had started her on Lipitor 10 mg which she has not started that she has been scared to start the medication.  Otherwise no other specific complaints today.  Past Medical History:  Diagnosis Date   Complicated UTI (urinary tract infection) 05/07/2019   History of kidney stones    Hyperlipidemia    Hypertension    Pulmonary sarcoidosis ([place])    Sleep apnea    Uncontrolled type 2 diabetes mellitus with hyperglycemia, without long-term current use of insulin ([place]) 05/06/2019   Past Surgical History:  Procedure Laterality Date   BREAST EXCISIONAL BIOPSY Left    CYSTOSCOPY W/ URETERAL STENT PLACEMENT Left 04/24/2020   Procedure: CYSTOSCOPY WITH RETROGRADE PYELOGRAM/URETERAL STENT PLACEMENT;  Surgeon: Janith Lima, MD;  Location: [facility];  Service: Urology;  Laterality: Left;   CYSTOSCOPY WITH STENT PLACEMENT Left 05/06/2019   Procedure: Cystoscopy with retrograde pyleogram and left stent placement, basket removal of stone foley placement;  Surgeon: Festus Aloe, MD;  Location: [facility];  Service: Urology;  Laterality: Left;   CYSTOSCOPY/URETEROSCOPY/HOLMIUM LASER/STENT PLACEMENT Left 05/13/2020   Procedure: CYSTOSCOPY/URETEROSCOPY/HOLMIUM  LASER/STENT EXCHANGE;  Surgeon: Robley Fries, MD;  Location: [facility];  Service: Urology;  Laterality: Left;   TONSILLECTOMY     Family History  Problem Relation Age of Onset   Pancreatic cancer Mother    Heart disease Father    Breast cancer Sister    Cancer Sister    Diabetes Sister    Cancer Brother     Social History   Tobacco Use   Smoking status: Never   Smokeless tobacco: Never  Substance Use Topics   Alcohol use: Not Currently   Marital Status: Married   ROS  Review of Systems  Cardiovascular:  Positive for dyspnea on exertion. Negative for chest pain and leg swelling.  Respiratory:  Positive for snoring.     Objective  Blood pressure 130/75, pulse 80, resp. rate 16, height _0  (1.575 m), weight 194 lb 12.8 oz (88.4 kg), SpO2 96 %.     12/17/2021   11:35 AM 10/21/2021    8:54 AM 07/21/2021    9:01 AM  Vitals with BMI  Height _1  _2  _3   Weight 194 lbs 13 oz 193 lbs 10 oz 192 lbs 6 oz  BMI 35.62 16.0 10.93  Systolic 235 573 220  Diastolic 75 76 77  Pulse 80 78 72    Physical Exam Constitutional:      Appearance: She is obese.  Neck:     Vascular: No carotid bruit or JVD.  Cardiovascular:     Rate and Rhythm: Normal rate and regular rhythm.     Pulses: Intact distal pulses.     Heart sounds: Normal heart  sounds. No murmur heard.    No gallop.  Pulmonary:     Effort: Pulmonary effort is normal.     Breath sounds: Normal breath sounds.  Musculoskeletal:     Right lower leg: No edema.     Left lower leg: No edema.     Laboratory examination:   Lab Results  Component Value Date   NA 142 11/05/2021   K 4.4 11/05/2021   CO2 25 11/05/2021   GLUCOSE 119 (H) 11/05/2021   BUN 30 (H) 11/05/2021   CREATININE 1.14 (H) 11/05/2021   CALCIUM 10.3 11/05/2021   EGFR 49 (L) 11/05/2021   GFRNONAA 48 (L) 05/22/2020    CrCl cannot be calculated (Patient's most recent lab result is older than the maximum 21 days allowed.).     Latest Ref Rng &  Units 11/05/2021    8:11 AM 08/03/2021    8:11 AM 01/15/2021    9:19 AM  CMP  Glucose 70 - 99 mg/dL 119  115  114   BUN 8 - 27 mg/dL _0 Creatinine 0.57 - 1.00 mg/dL 1.14  1.04  1.07   Sodium 134 - 144 mmol/L 142  141  144   Potassium 3.5 - 5.2 mmol/L 4.4  4.6  4.1   Chloride 96 - 106 mmol/L 102  101  104   CO2 20 - 29 mmol/L _1 Calcium 8.7 - 10.3 mg/dL 10.3  10.4  10.3       Latest Ref Rng & Units 05/22/2020    6:02 PM 05/07/2020    9:09 AM 04/27/2020    4:25 AM  CBC  WBC 4.0 - 10.5 K/uL 7.0  5.0  8.0   Hemoglobin 12.0 - 15.0 g/dL 12.6  11.5  9.8   Hematocrit 36.0 - 46.0 % 40.6  35.7  30.8   Platelets 150 - 400 K/uL 426  433  312     Lipid Panel Recent Labs    01/15/21 0919 08/03/21 0811 12/15/21 1351  CHOL 246* 241* 247*  TRIG 119 137 282*  LDLCALC 146* 134* 121*  HDL 79 83 77   HEMOGLOBIN A1C Lab Results  Component Value Date   HGBA1C 6.6 (H) 04/25/2020   MPG 142.72 04/25/2020   Lab Results  Component Value Date   TSH 2.278 03/19/2020    Allergies  No Known Allergies    Final Medications at End of Visit     Current Outpatient Medications:    acetaminophen (TYLENOL) 325 MG tablet, Take 2 tablets (650 mg total) by mouth every 6 (six) hours as needed for mild pain, fever or headache., Disp: , Rfl:    amLODipine (NORVASC) 10 MG tablet, Take 10 mg by mouth daily., Disp: , Rfl:    atorvastatin (LIPITOR) 10 MG tablet, Take 1 tablet (10 mg total) by mouth daily., Disp: 90 tablet, Rfl: 3   Finerenone (KERENDIA) 10 MG TABS, Take 1 tablet by mouth daily., Disp: 90 tablet, Rfl: 3   glipizide-metformin (METAGLIP) 2.5-250 MG tablet, Take 1 tablet by mouth 2 (two) times daily., Disp: , Rfl:    losartan-hydrochlorothiazide (HYZAAR) 50-12.5 MG tablet, Take 1 tablet by mouth every morning., Disp: 30 tablet, Rfl: 2   metoprolol succinate (TOPROL-XL) 100 MG 24 hr tablet, Take 1 tablet (100 mg total) by mouth every evening. Take with or immediately following a  meal. (Patient taking differently: Take 50 mg by mouth every evening.), Disp: 90 tablet, Rfl:  3   Radiology:   No results found.  Cardiac Studies:   Echocardiogram 07/15/2020:  Left ventricle cavity is normal in size. Moderate concentric hypertrophy  of the left ventricle. Normal global wall motion. Normal LV systolic function with EF 65%. Indeterminate diastolic filling pattern due to E/A fusion.  No significant valvular abnormality.  IVC not seen.  Exercise Myoview stress test 07/30/2020: 1 Day Rest/Stress Protocol. Exercise time 1 minute 46 seconds on Bruce protocol, achieved 4.06 METS, and 104% age-predicted maximum heart rate.  Poor functional capacity for age, at the current workload stress ECG negative for ischemia. Medium size, mild intensity fixed perfusion defect due to diaphragmatic attenuation.  Otherwise normal myocardial perfusion without evidence of reversible ischemia or prior infarct. LVEF per gated SPECT 57%, normal wall motion, and normal ventricular size. At the current workload overall a low risk study.   Clinical correlation required due to poor functional capacity and tachycardia at baseline.  EKG:   07/22/2021: Sinus cardia at a rate of 54 bpm.  Normal axis.  No evidence of ischemia or underlying injury pattern.  Unchanged compared to EKG 01/15/2021.  07/09/2020: Sinus tachycardia at a rate of 104 bpm.  Left atrial enlargement.  Normal axis.  Incomplete right bundle branch block.  No evidence of ischemia or underlying injury pattern.  Compared EKG 05/23/2020, no significant change.   Assessment     ICD-10-CM   1. Primary hypertension  I10     2. Dyspnea on exertion  R06.09     3. Hypercholesterolemia  E78.00        There are no discontinued medications.   No orders of the defined types were placed in this encounter.   Recommendations:   SHAWNTEE MAINWARING is a 81 y.o. AA female with history of hypertension, hyperlipidemia, type 2 diabetes mellitus with  stage 3a chronic kidney disease, pulmonary sarcoidosis (diagnosed in the 44s), vitamin D deficiency, former smoker.  Patient presents for 8-week follow-up visit for hypertension specifically and also hyperlipidemia.  1. Primary hypertension Blood pressure is under excellent control with present medications.  No changes were done today.  She is also enrolled in [employer], continue the same for now for compliance.  2. Dyspnea on exertion Dyspnea on exertion is related to underlying lung disease and also hypertensive heart disease with moderate LVH noted on echocardiogram.  No PND or orthopnea or clinical evidence of heart failure.  Suspect with exercise which have encouraged her to do and weight loss as well as blood pressure, symptoms may improve over time.  3. Hypercholesterolemia Lipids are very high, she is diabetic, she did not want to be on a statin, she had addition made to start low-dose statin which she has not started yet, advised her to try this for 2 weeks and see how she does.  She is willing to do this, at least some statin will be beneficial for her instead of no statins are all.  She is presently 81 years of age and is doing well.  Hopefully with making lifestyle changes which she has done with regard to diet, exercise and low-dose of statin, lipids improved significantly.  I will see her back in 6 months, if she remains stable I will see her back on a as needed basis.   Enrolled in [employer] November 0814  Average Systolic BP Level 481.85 mmHg Lowest Systolic BP Level 631 mmHg Highest Systolic BP Level 497 mmHg      Adrian Prows, MD, [facility] 12/17/2021, 12:03 PM Office:  [phone] Fax: [phone] Pager: [phone]

## 2021-12-17 NOTE — Telephone Encounter (Signed)
Patient's home BP is well controlled on current antihypertensive regimen.  Average Systolic BP Level 493.24 mmHg Lowest Systolic BP Level 199 mmHg Highest Systolic BP Level 144 mmHg  Average Diastolic BP Level 45.84 mmHg Lowest Diastolic BP Level 58 mmHg Highest Diastolic BP Level 82 mmHg  Average Pulse Level 74.23 BPM Lowest Pulse Level 63 BPM Highest Pulse Level 95 BPM  12/16/2021 Wednesday at 07:07 AM 114 / 68      12/15/2021 Tuesday at 08:32 AM 117 / 70      12/14/2021 Monday at 10:08 AM 116 / 63      12/13/2021 'Sunday at 10:41 AM 117 / 68      12/12/2021 Saturday at 12:20 PM 119 / 67      12/11/2021 Friday at 12:37 PM 122 / 68      12/10/2021 Thursday at 03:30 PM 143 / 82      12/09/2021 Wednesday at 09:17 AM 116 / 65      12/08/2021 Tuesday at 10:32 AM 119 / 76      12/07/2021 Monday at 01:07 PM 119 / 72      12'$ /03/2021 Sunday at 12:30 PM 113 / 67

## 2022-01-05 ENCOUNTER — Other Ambulatory Visit: Payer: Self-pay | Admitting: Cardiology

## 2022-02-01 ENCOUNTER — Ambulatory Visit
Admission: RE | Admit: 2022-02-01 | Discharge: 2022-02-01 | Disposition: A | Discharge status: Home / Self Care | Payer: Federal, State, Local not specified - PPO | Source: Ambulatory Visit | Attending: Obstetrics and Gynecology | Admitting: Obstetrics and Gynecology

## 2022-02-01 DIAGNOSIS — M810 Age-related osteoporosis without current pathological fracture: Secondary | ICD-10-CM

## 2022-02-16 ENCOUNTER — Other Ambulatory Visit: Payer: Self-pay

## 2022-02-16 DIAGNOSIS — N1831 Chronic kidney disease, stage 3a: Secondary | ICD-10-CM

## 2022-02-16 MED ORDER — KERENDIA 10 MG PO TABS: 1.0000 | ORAL_TABLET | Freq: Every day | ORAL | 3 refills | Status: AC

## 2022-02-16 NOTE — Telephone Encounter (Signed)
Gave verbal Rx to Thayer Headings at [facility] for patient to get medication through [facility] (Ph [phone]))

## 2022-04-04 ENCOUNTER — Other Ambulatory Visit: Payer: Self-pay | Admitting: Cardiology

## 2022-06-04 ENCOUNTER — Other Ambulatory Visit: Payer: Self-pay

## 2022-06-04 DIAGNOSIS — I10 Essential (primary) hypertension: Secondary | ICD-10-CM

## 2022-06-04 MED ORDER — METOPROLOL SUCCINATE ER 50 MG PO TB24: 50.0000 mg | ORAL_TABLET | Freq: Every day | ORAL | 1 refills | Status: AC

## 2022-06-17 ENCOUNTER — Ambulatory Visit: Admitting: Cardiology

## 2022-06-22 ENCOUNTER — Ambulatory Visit: Payer: Medicare Other | Admitting: Cardiology

## 2022-06-22 ENCOUNTER — Encounter: Payer: Self-pay | Admitting: Cardiology

## 2022-06-22 VITALS — BP 127/75 | HR 79 | Resp 16 | Ht 62.0 in | Wt 195.8 lb

## 2022-06-22 DIAGNOSIS — E7800 Pure hypercholesterolemia, unspecified: Secondary | ICD-10-CM

## 2022-06-22 DIAGNOSIS — I10 Essential (primary) hypertension: Secondary | ICD-10-CM

## 2022-06-22 NOTE — Progress Notes (Signed)
Primary Physician/Referring:  Andi Devon, MD  Patient ID: [name], female    DOB: [birth year], 82 y.o.   MRN: [id number]  Chief Complaint  Patient presents with   Hypertension   Hyperlipidemia   Follow-up   HPI:    Diane Wilkerson  is an 82 y.o. 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    Patient states that since being on blood pressure medications, her dyspnea has improved.  She denies chest pain, palpitations, dizziness or syncope.  At her last office visit after long discussion, I had started her on Lipitor 10 mg which she has not started that she has been scared to start the medication.  Otherwise no other specific complaints today.  Past Medical History:  Diagnosis Date   Complicated UTI (urinary tract infection) 05/07/2019   History of kidney stones    Hyperlipidemia    Hypertension    Pulmonary sarcoidosis ([facility])    Sleep apnea    Uncontrolled type 2 diabetes mellitus with hyperglycemia, without long-term current use of insulin ([facility]) 05/06/2019   Past Surgical History:  Procedure Laterality Date   BREAST EXCISIONAL BIOPSY Left    CYSTOSCOPY W/ URETERAL STENT PLACEMENT Left 04/24/2020   Procedure: CYSTOSCOPY WITH RETROGRADE PYELOGRAM/URETERAL STENT PLACEMENT;  Surgeon: Jannifer Hick, MD;  Location: [facility];  Service: Urology;  Laterality: Left;   CYSTOSCOPY WITH STENT PLACEMENT Left 05/06/2019   Procedure: Cystoscopy with retrograde pyleogram and left stent placement, basket removal of stone foley placement;  Surgeon: Jerilee Field, MD;  Location: [facility];  Service: Urology;  Laterality: Left;   CYSTOSCOPY/URETEROSCOPY/HOLMIUM LASER/STENT PLACEMENT Left 05/13/2020   Procedure: CYSTOSCOPY/URETEROSCOPY/HOLMIUM LASER/STENT EXCHANGE;  Surgeon: Noel Christmas, MD;  Location: [facility];  Service: Urology;   Laterality: Left;   TONSILLECTOMY     Family History  Problem Relation Age of Onset   Pancreatic cancer Mother    Heart disease Father    Breast cancer Sister    Cancer Sister    Diabetes Sister    Cancer Brother     Social History   Tobacco Use   Smoking status: Never   Smokeless tobacco: Never  Substance Use Topics   Alcohol use: Not Currently   Marital Status: Married   ROS  Review of Systems  Cardiovascular:  Positive for dyspnea on exertion. Negative for chest pain and leg swelling.  Respiratory:  Positive for snoring.     Objective  Blood pressure 127/75, pulse 79, resp. rate 16, height 5\' 2"  (1.575 m), weight 195 lb 12.8 oz (88.8 kg), SpO2 96 %.     06/22/2022   10:09 AM 12/17/2021   11:35 AM 10/21/2021    8:54 AM  Vitals with BMI  Height 5\' 2"  5\' 2"  5\' 2"   Weight 195 lbs 13 oz 194 lbs 13 oz 193 lbs 10 oz  BMI 35.8 35.62 35.4  Systolic 127 130 409  Diastolic 75 75 76  Pulse 79 80 78    Physical Exam Constitutional:      Appearance: She is obese.  Neck:     Vascular: No carotid bruit or JVD.  Cardiovascular:     Rate and Rhythm: Normal rate and regular rhythm.     Pulses: Intact distal pulses.     Heart sounds: Normal heart sounds. No murmur heard.    No gallop.  Pulmonary:  Effort: Pulmonary effort is normal.     Breath sounds: Normal breath sounds.  Musculoskeletal:     Right lower leg: No edema.     Left lower leg: No edema.    Laboratory examination:   Lab Results  Component Value Date   NA 142 11/05/2021   K 4.4 11/05/2021   CO2 25 11/05/2021   GLUCOSE 119 (H) 11/05/2021   BUN 30 (H) 11/05/2021   CREATININE 1.14 (H) 11/05/2021   CALCIUM 10.3 11/05/2021   EGFR 49 (L) 11/05/2021   GFRNONAA 48 (L) 05/22/2020    CrCl cannot be calculated (Patient's most recent lab result is older than the maximum 21 days allowed.).     Latest Ref Rng & Units 11/05/2021    8:11 AM 08/03/2021    8:11 AM 01/15/2021    9:19 AM  CMP  Glucose 70 - 99  mg/dL 161  096  045   BUN 8 - 27 mg/dL 30  24  13    Creatinine 0.57 - 1.00 mg/dL 4.09  8.11  9.14   Sodium 134 - 144 mmol/L 142  141  144   Potassium 3.5 - 5.2 mmol/L 4.4  4.6  4.1   Chloride 96 - 106 mmol/L 102  101  104   CO2 20 - 29 mmol/L 25  25  23    Calcium 8.7 - 10.3 mg/dL 78.2  95.6  21.3       Latest Ref Rng & Units 05/22/2020    6:02 PM 05/07/2020    9:09 AM 04/27/2020    4:25 AM  CBC  WBC 4.0 - 10.5 K/uL 7.0  5.0  8.0   Hemoglobin 12.0 - 15.0 g/dL 08.6  57.8  9.8   Hematocrit 36.0 - 46.0 % 40.6  35.7  30.8   Platelets 150 - 400 K/uL 426  433  312     Lipid Panel Recent Labs    08/03/21 0811 12/15/21 1351  CHOL 241* 247*  TRIG 137 282*  LDLCALC 134* 121*  HDL 83 77   HEMOGLOBIN A1C Lab Results  Component Value Date   HGBA1C 6.6 (H) 04/25/2020   MPG 142.72 04/25/2020   Lab Results  Component Value Date   TSH 2.278 03/19/2020    Radiology:   No results found.  Cardiac Studies:   Echocardiogram 07/15/2020:  Left ventricle cavity is normal in size. Moderate concentric hypertrophy  of the left ventricle. Normal global wall motion. Normal LV systolic function with EF 65%. Indeterminate diastolic filling pattern due to E/A fusion.  No significant valvular abnormality.  IVC not seen.  Exercise Myoview stress test 07/30/2020: 1 Day Rest/Stress Protocol. Exercise time 1 minute 46 seconds on Bruce protocol, achieved 4.06 METS, and 104% age-predicted maximum heart rate.  Poor functional capacity for age, at the current workload stress ECG negative for ischemia. Medium size, mild intensity fixed perfusion defect due to diaphragmatic attenuation.  Otherwise normal myocardial perfusion without evidence of reversible ischemia or prior infarct. LVEF per gated SPECT 57%, normal wall motion, and normal ventricular size. At the current workload overall a low risk study.   Clinical correlation required due to poor functional capacity and tachycardia at baseline.  EKG:    EKG 06/22/2022: Normal sinus rhythm at rate of 74 bpm, normal EKG.  No change from 07/21/2021.  Allergies & Medications   No Known Allergies   Current Outpatient Medications:    acetaminophen (TYLENOL) 325 MG tablet, Take 2 tablets (650 mg total) by mouth every  6 (six) hours as needed for mild pain, fever or headache., Disp: , Rfl:    amLODipine (NORVASC) 10 MG tablet, Take 10 mg by mouth daily., Disp: , Rfl:    Finerenone (KERENDIA) 10 MG TABS, Take 1 tablet (10 mg total) by mouth daily., Disp: 90 tablet, Rfl: 3   glipizide-metformin (METAGLIP) 2.5-250 MG tablet, Take 1 tablet by mouth 2 (two) times daily., Disp: , Rfl:    losartan-hydrochlorothiazide (HYZAAR) 50-12.5 MG tablet, TAKE 1 TABLET BY MOUTH EVERY DAY IN THE MORNING, Disp: 90 tablet, Rfl: 0   metoprolol succinate (TOPROL XL) 50 MG 24 hr tablet, Take 1 tablet (50 mg total) by mouth daily. Take with or immediately following a meal., Disp: 90 tablet, Rfl: 1   atorvastatin (LIPITOR) 10 MG tablet, Take 1 tablet (10 mg total) by mouth every evening., Disp: 90 tablet, Rfl: 3   Assessment     ICD-10-CM   1. Essential hypertension  I10 EKG 12-Lead    2. Hypercholesterolemia  E78.00 atorvastatin (LIPITOR) 10 MG tablet       Medications Discontinued During This Encounter  Medication Reason   atorvastatin (LIPITOR) 10 MG tablet    Coenzyme Q10 100 MG capsule      No orders of the defined types were placed in this encounter.   Recommendations:   Diane Wilkerson is a 82 y.o. AA female with history of hypertension, hyperlipidemia, type 2 diabetes mellitus with stage 3a chronic kidney disease, pulmonary sarcoidosis (diagnosed in the 21s), vitamin D deficiency, former smoker.  Patient presents for 6 month OV for hypertension specifically and also hyperlipidemia.  1. Essential hypertension Patient is presently doing well and blood pressure under excellent control.  I did not make any changes to her medications.  She is also not had  any palpitations since being on low-dose of metoprolol succinate.  Also 82 years of age presently doing well.  Weight loss again discussed with the patient. - EKG 12-Lead  2. Hypercholesterolemia Patient had discontinued taking statins although on a low-dose.  I discussed with her regarding primary prevention especially in a diabetic, advised her to restart atorvastatin at least 5 days a week.  She is now willing to do this.  She remains stable from cardiac standpoint, low-dose stress test and normal echocardiogram, I will see her back on a as needed basis.  I wished her a happy birthday.   Yates Decamp, MD, [facility] 06/22/2022, 11:17 AM Office: [phone] Fax: [phone] Pager: [phone]

## 2022-07-21 IMAGING — CR DG CHEST 2V
3 series · 3 of 3 positions shown · non-contrast
Comparison: 05/05/2019

CLINICAL DATA: Shortness of breath and fatigue for the past week.
Ex-smoker.

EXAM:
CHEST - 2 VIEW

[w chest pa]
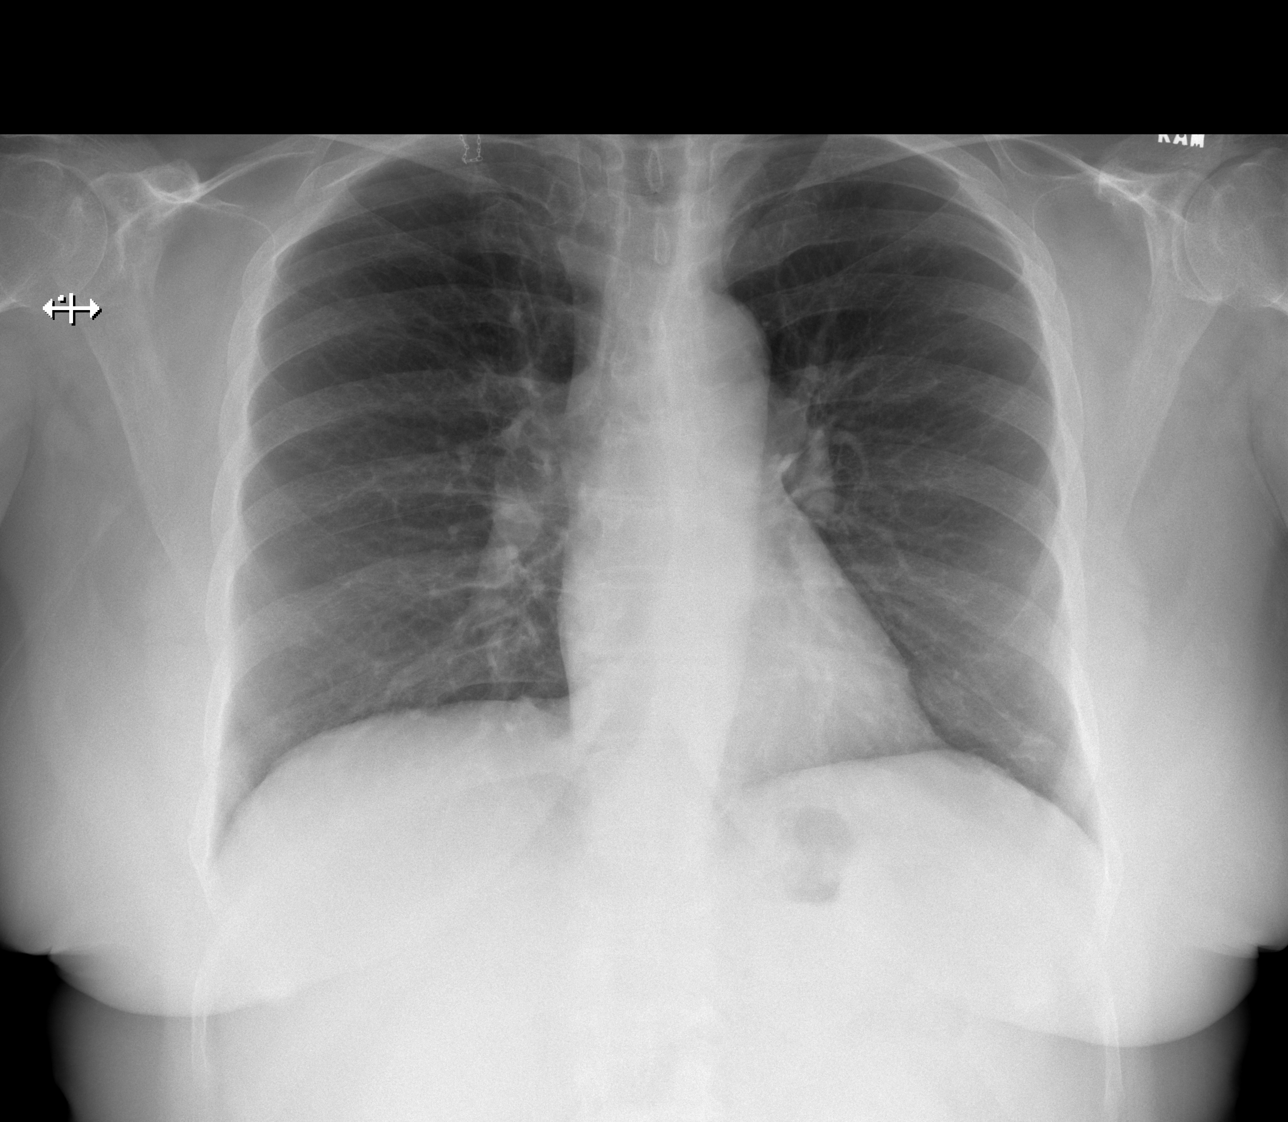

[w chest lat (1 of 2)]
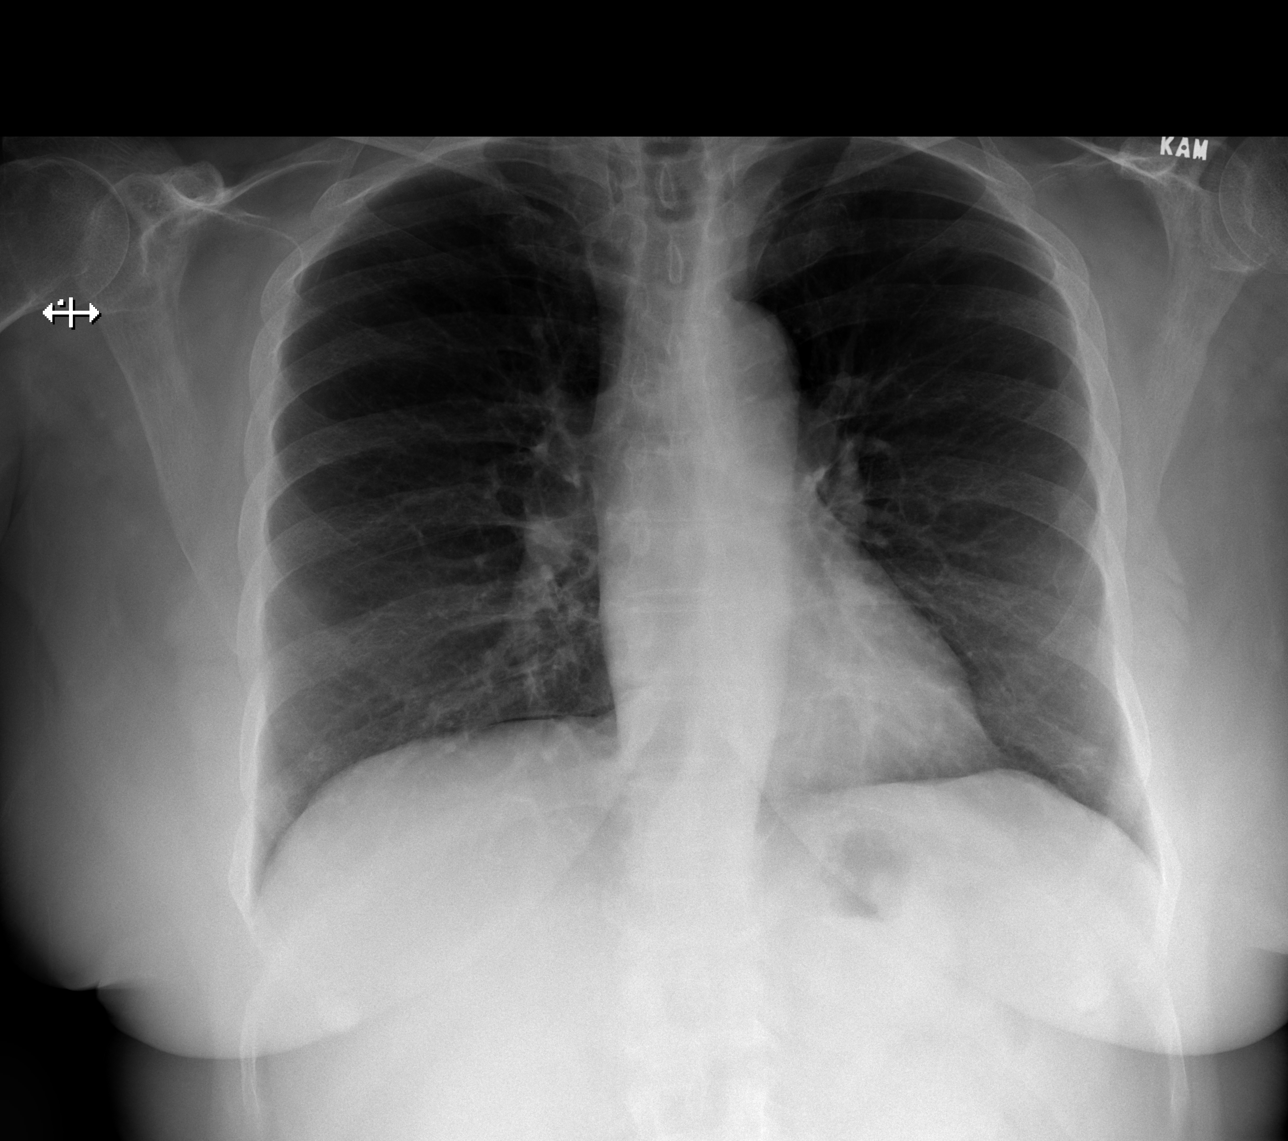

[w chest lat (2 of 2)]
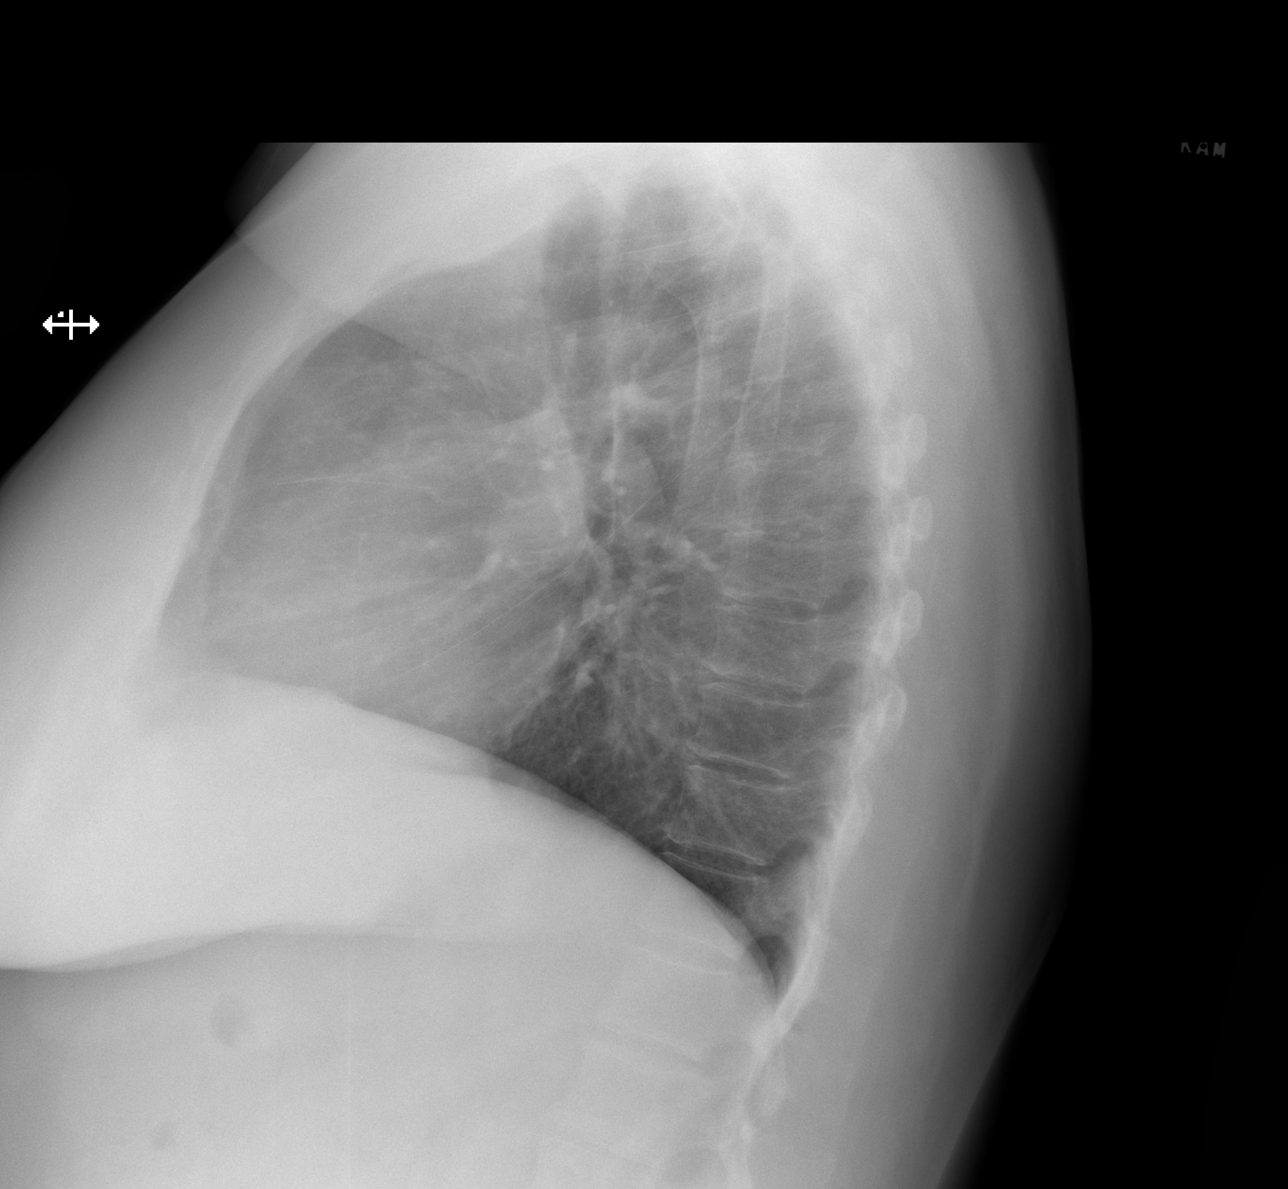

[3 of 3 positions shown; findings below may reference images not displayed]

FINDINGS: Normal sized heart. Clear lungs with normal vascularity. Minimal
thoracic spine degenerative changes.
IMPRESSION: No acute abnormality.

## 2022-08-11 ENCOUNTER — Other Ambulatory Visit: Payer: Self-pay

## 2022-08-11 ENCOUNTER — Emergency Department (HOSPITAL_COMMUNITY): Payer: Medicare Other

## 2022-08-11 ENCOUNTER — Emergency Department (HOSPITAL_COMMUNITY)
Admission: EM | Admit: 2022-08-11 | Discharge: 2022-08-11 | Disposition: A | Discharge status: Home / Self Care | Payer: Medicare Other | Attending: Emergency Medicine | Admitting: Emergency Medicine

## 2022-08-11 ENCOUNTER — Encounter (HOSPITAL_COMMUNITY): Payer: Self-pay | Admitting: Emergency Medicine

## 2022-08-11 DIAGNOSIS — R0789 Other chest pain: Secondary | ICD-10-CM | POA: Insufficient documentation

## 2022-08-11 DIAGNOSIS — R079 Chest pain, unspecified: Secondary | ICD-10-CM | POA: Diagnosis present

## 2022-08-11 LAB — BASIC METABOLIC PANEL
Anion gap: 13 (ref 5–15)
BUN: 25 mg/dL — ABNORMAL HIGH (ref 8–23)
CO2: 22 mmol/L (ref 22–32)
Calcium: 10.1 mg/dL (ref 8.9–10.3)
Chloride: 102 mmol/L (ref 98–111)
Creatinine, Ser: 1.08 mg/dL — ABNORMAL HIGH (ref 0.44–1.00)
GFR, Estimated: 52 mL/min — ABNORMAL LOW (ref >60)
Glucose, Bld: 135 mg/dL — ABNORMAL HIGH (ref 70–99)
Potassium: 3.8 mmol/L (ref 3.5–5.1)
Sodium: 137 mmol/L (ref 135–145)

## 2022-08-11 LAB — TROPONIN I (HIGH SENSITIVITY)
Troponin I (High Sensitivity): 3 ng/L (ref <18)
Troponin I (High Sensitivity): 3 ng/L (ref <18)

## 2022-08-11 LAB — CBC
HCT: 35.8 % — ABNORMAL LOW (ref 36.0–46.0)
Hemoglobin: 11.5 g/dL — ABNORMAL LOW (ref 12.0–15.0)
MCH: 27.6 pg (ref 26.0–34.0)
MCHC: 32.1 g/dL (ref 30.0–36.0)
MCV: 85.9 fL (ref 80.0–100.0)
Platelets: 393 10*3/uL (ref 150–400)
RBC: 4.17 MIL/uL (ref 3.87–5.11)
RDW: 14.5 % (ref 11.5–15.5)
WBC: 7 10*3/uL (ref 4.0–10.5)
nRBC: 0 % (ref 0.0–0.2)

## 2022-08-11 LAB — CBG MONITORING, ED: Glucose-Capillary: 123 mg/dL — ABNORMAL HIGH (ref 70–99)

## 2022-08-11 MED ORDER — PANTOPRAZOLE SODIUM 40 MG PO TBEC
40.0000 mg | DELAYED_RELEASE_TABLET | Freq: Every day | ORAL | Status: DC
  Administered 2022-08-11: 40 mg via ORAL
  Filled 2022-08-11: qty 1

## 2022-08-11 MED ORDER — PANTOPRAZOLE SODIUM 20 MG PO TBEC: 20.0000 mg | DELAYED_RELEASE_TABLET | Freq: Every day | ORAL | 0 refills | Status: AC

## 2022-08-11 MED ORDER — ALUM & MAG HYDROXIDE-SIMETH 200-200-20 MG/5ML PO SUSP
30.0000 mL | Freq: Once | ORAL | Status: AC
  Administered 2022-08-11: 30 mL via ORAL
  Filled 2022-08-11: qty 30

## 2022-08-11 MED ORDER — MYLANTA MAXIMUM STRENGTH 400-400-40 MG/5ML PO SUSP: 15.0000 mL | Freq: Four times a day (QID) | ORAL | 0 refills | Status: AC | PRN

## 2022-08-11 MED ORDER — FAMOTIDINE 20 MG PO TABS
20.0000 mg | ORAL_TABLET | Freq: Once | ORAL | Status: AC
  Administered 2022-08-11: 20 mg via ORAL
  Filled 2022-08-11: qty 1

## 2022-08-11 MED ORDER — FAMOTIDINE 20 MG PO TABS: 20.0000 mg | ORAL_TABLET | Freq: Three times a day (TID) | ORAL | 0 refills | Status: AC | PRN

## 2022-08-11 NOTE — ED Triage Notes (Signed)
Pt reports chest pain that radiates around both sides and into her back. Pt reports the pain is worse with deep breathing. PT states this started 4 days ago.

## 2022-08-11 NOTE — ED Provider Notes (Signed)
[place] [facility]MENT AT [facility] Provider Note   CSN: [id number] Arrival date & time: 08/11/22  1707     History Chief Complaint  Patient presents with   Chest Pain    HPI Diane Wilkerson is a 82 y.o. female presenting for chief complaint of chest pain.  82 year old female with a history hypertension, hyperlipidemia, obesity, diabetes.  Follows with a cardiologist had a negative stress test last year. States that she started having chest pain approximately 2 weeks ago all throughout the day worse after meals.  States that the episode tonight was mildly worse than prior.  Notes that it completely resolved by time of arrival.  Currently asymptomatic.  Longstanding history of gastritis and gastroesophageal reflux disease currently not on therapy..  Patient's recorded medical, surgical, social, medication list and allergies were reviewed in the Snapshot window as part of the initial history.   Review of Systems   Review of Systems  Constitutional:  Negative for chills and fever.  HENT:  Negative for ear pain and sore throat.   Eyes:  Negative for pain and visual disturbance.  Respiratory:  Positive for chest tightness. Negative for cough and shortness of breath.   Cardiovascular:  Positive for chest pain. Negative for palpitations.  Gastrointestinal:  Negative for abdominal pain and vomiting.  Genitourinary:  Negative for dysuria and hematuria.  Musculoskeletal:  Negative for arthralgias and back pain.  Skin:  Negative for color change and rash.  Neurological:  Negative for seizures and syncope.  All other systems reviewed and are negative.   Physical Exam Updated Vital Signs BP 138/75 (BP Location: Right Arm)   Pulse 72   Temp 98.3 F (36.8 C) (Oral)   Resp 18   LMP  (LMP Unknown) Comment: postmenopausal  SpO2 100%  Physical Exam Vitals and nursing note reviewed.  Constitutional:      General: She is not in acute distress.    Appearance: She is  well-developed.  HENT:     Head: Normocephalic and atraumatic.  Eyes:     Conjunctiva/sclera: Conjunctivae normal.  Cardiovascular:     Rate and Rhythm: Normal rate and regular rhythm.     Heart sounds: No murmur heard. Pulmonary:     Effort: Pulmonary effort is normal. No respiratory distress.     Breath sounds: Normal breath sounds.  Abdominal:     General: There is no distension.     Palpations: Abdomen is soft.     Tenderness: There is no abdominal tenderness. There is no right CVA tenderness or left CVA tenderness.  Musculoskeletal:        General: No swelling or tenderness. Normal range of motion.     Cervical back: Neck supple.  Skin:    General: Skin is warm and dry.  Neurological:     General: No focal deficit present.     Mental Status: She is alert and oriented to person, place, and time. Mental status is at baseline.     Cranial Nerves: No cranial nerve deficit.      ED Course/ Medical Decision Making/ A&P    Procedures Procedures   Medications Ordered in ED Medications - No data to display Medical Decision Making: Diane Wilkerson is a 82 y.o. female who presented to the ED today with chest pain, detailed above.  Based on patient's comorbidities, patient has a heart score of 5.    Patient's presentation is complicated by their history of multiple comorbid medical problems.  Patient placed  on continuous vitals and telemetry monitoring while in ED which was reviewed periodically.  Complete initial physical exam performed, notably the patient was hemodynamically stable no acute distress currently asymptomatic.   Reviewed and confirmed nursing documentation for past medical history, family history, social history.    Initial Assessment: With the patient's presentation of left-sided chest pain, most likely diagnosis is musculoskeletal chest pain versus GERD, although ACS remains on the differential. Other diagnoses were considered including (but not limited to)  pulmonary embolism, community-acquired pneumonia, aortic dissection, pneumothorax, underlying bony abnormality, anemia. These are considered less likely due to history of present illness and physical exam findings.    In particular, concerning pulmonary embolism: Patient is PERC positive and the they deny malignancy, recent surgery, history of DVT, or calf tenderness leading to a low risk Wells score. Aortic Dissection also reconsidered but seems less likely based on the location, quality, onset, and severity of symptoms in this case. . Patient also has a lack of underlying history of AD or TAA.  This is most consistent with an acute life/limb threatening illness complicated by underlying chronic conditions.   Initial Plan: Evaluate for ACS with delta troponin and EKG evaluated as below  Evaluate for dissection, bony abnormality, or pneumonia with chest x-ray and screening laboratory evaluation including CBC, BMP  Further evaluation for pulmonary embolism not indicated at this time based on patient's PERC and Wells score.  In particular, she has low Wells and without any shortness of breath or pleuritic chest pain.  Pulmonary embolism especially clinically relevant pulmonary embolism seems grossly inconsistent with this presentation. Further evaluation for Thoracic Aortic Dissection not indicated at this time based on patient's clinical history and PE findings.   Initial Study Results: EKG was reviewed independently. Rate, rhythm, axis, intervals all examined and without medically relevant abnormality. ST segments without concerns for elevations.    Laboratory  Delta troponin demonstrated no acute pathology  CBC and BMP without obvious metabolic or inflammatory abnormalities requiring further evaluation   Radiology  DG Chest 2 View  Result Date: 08/11/2022 CLINICAL DATA:  Chest pain. EXAM: CHEST - 2 VIEW COMPARISON:  May 22, 2020 FINDINGS: The heart size and mediastinal contours are within  normal limits. Very mild atelectasis is seen within the lateral aspect of the left lung base. There is no evidence of an acute infiltrate, pleural effusion or pneumothorax. The visualized skeletal structures are unremarkable. IMPRESSION: No active cardiopulmonary disease. Electronically Signed   By: Aram Candela M.D.   On: 08/11/2022 17:56    Final Assessment and Plan: Reassessed patient after 4-1/2 hours in the emergency room.  Serial negative troponins.  Her description of the discomfort is more consistent with gastroesophageal etiology.  Will start patient on PPI, antacid for symptom control and recommend follow-up with her PCP for reassessment.  She has an underlying elevated risk for cardiac disease, Fortunately she had a negative PET stress.  Approximately 2 years ago.  She needs to follow-up with her cardiologist in the subacute setting. Discussed elevated heart score and consideration for admission, however given resolution of symptoms and well appearance in the setting of negative troponins, family feels comfortable outpatient care and management.  Strict return precautions regarding interval worsening reinforce and expressed understanding.   Disposition:  I have considered need for hospitalization, however, considering all of the above, I believe this patient is stable for discharge at this time.  Patient/family educated about specific return precautions for given chief complaint and symptoms.  Patient/family educated about  follow-up with PCP.     Patient/family expressed understanding of return precautions and need for follow-up. Patient spoken to regarding all imaging and laboratory results and appropriate follow up for these results. All education provided in verbal form with additional information in written form. Time was allowed for answering of patient questions. Patient discharged.    Emergency Department Medication Summary:   Medications  famotidine (PEPCID) tablet 20 mg (has  no administration in time range)  alum & mag hydroxide-simeth (MAALOX/MYLANTA) 200-200-20 MG/5ML suspension 30 mL (has no administration in time range)  pantoprazole (PROTONIX) EC tablet 40 mg (has no administration in time range)              Clinical Impression: No diagnosis found.   Data Unavailable   Final Clinical Impression(s) / ED Diagnoses Final diagnoses:  None    Rx / DC Orders ED Discharge Orders     None         Glyn Ade, MD 08/11/22 2303

## 2023-01-10 IMAGING — MG MM DIGITAL DIAGNOSTIC UNILAT*L* W/ TOMO W/ CAD
4 series · 4 of 12 positions shown · non-contrast
Comparison: Previous exam(s).

CLINICAL DATA: Possible asymmetry in the left breast.

EXAM:
DIGITAL DIAGNOSTIC UNILATERAL LEFT MAMMOGRAM WITH TOMOSYNTHESIS AND
CAD
TECHNIQUE: Left digital diagnostic mammography and breast tomosynthesis was
performed. The images were evaluated with computer-aided detection.

[L ML synth-2D]
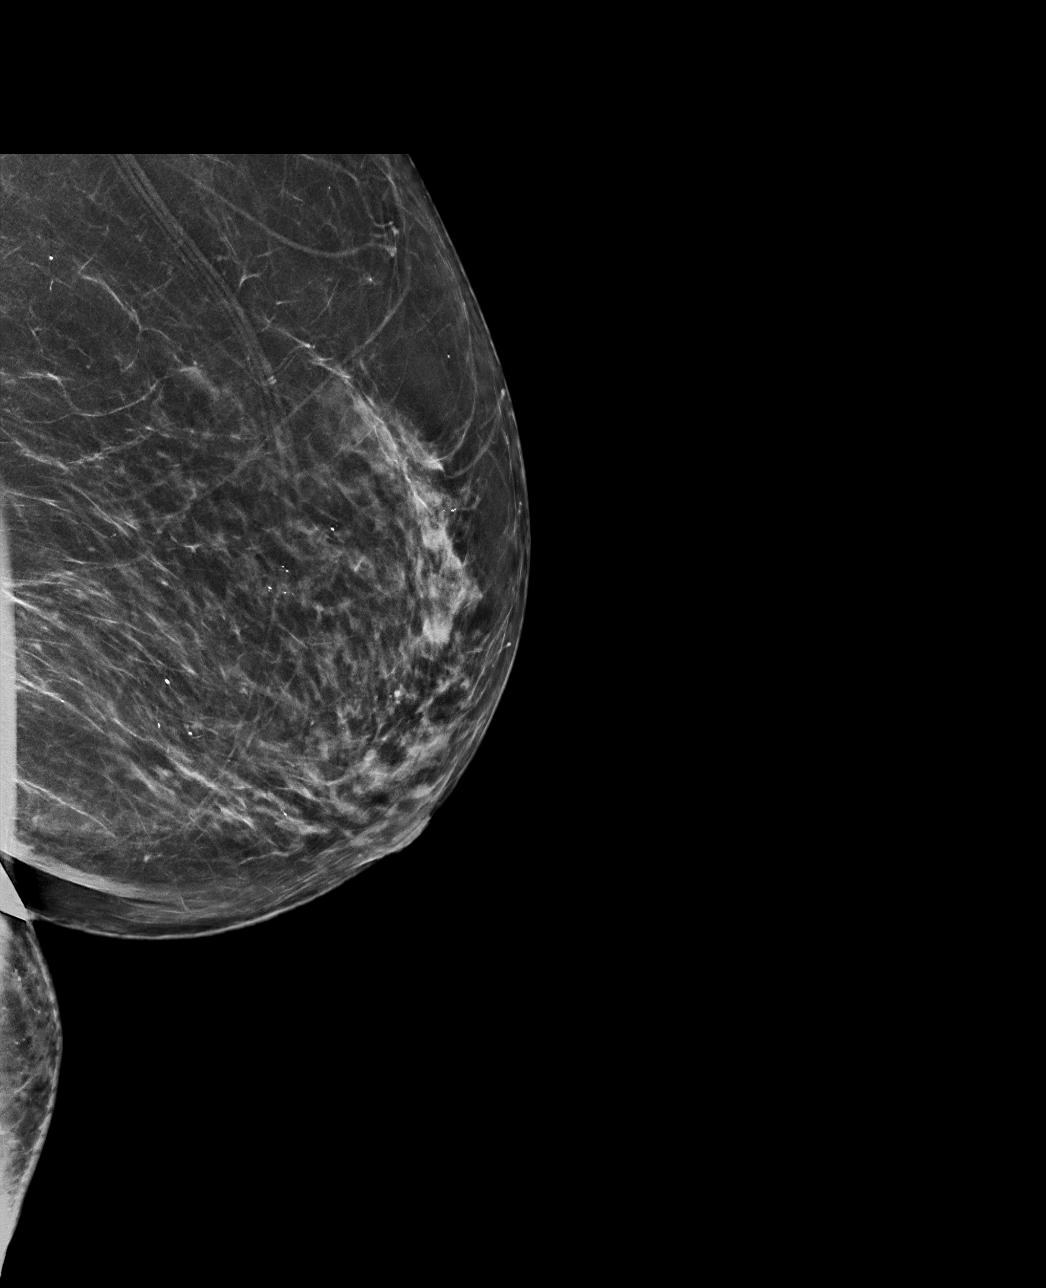

[L MLO synth-2D]
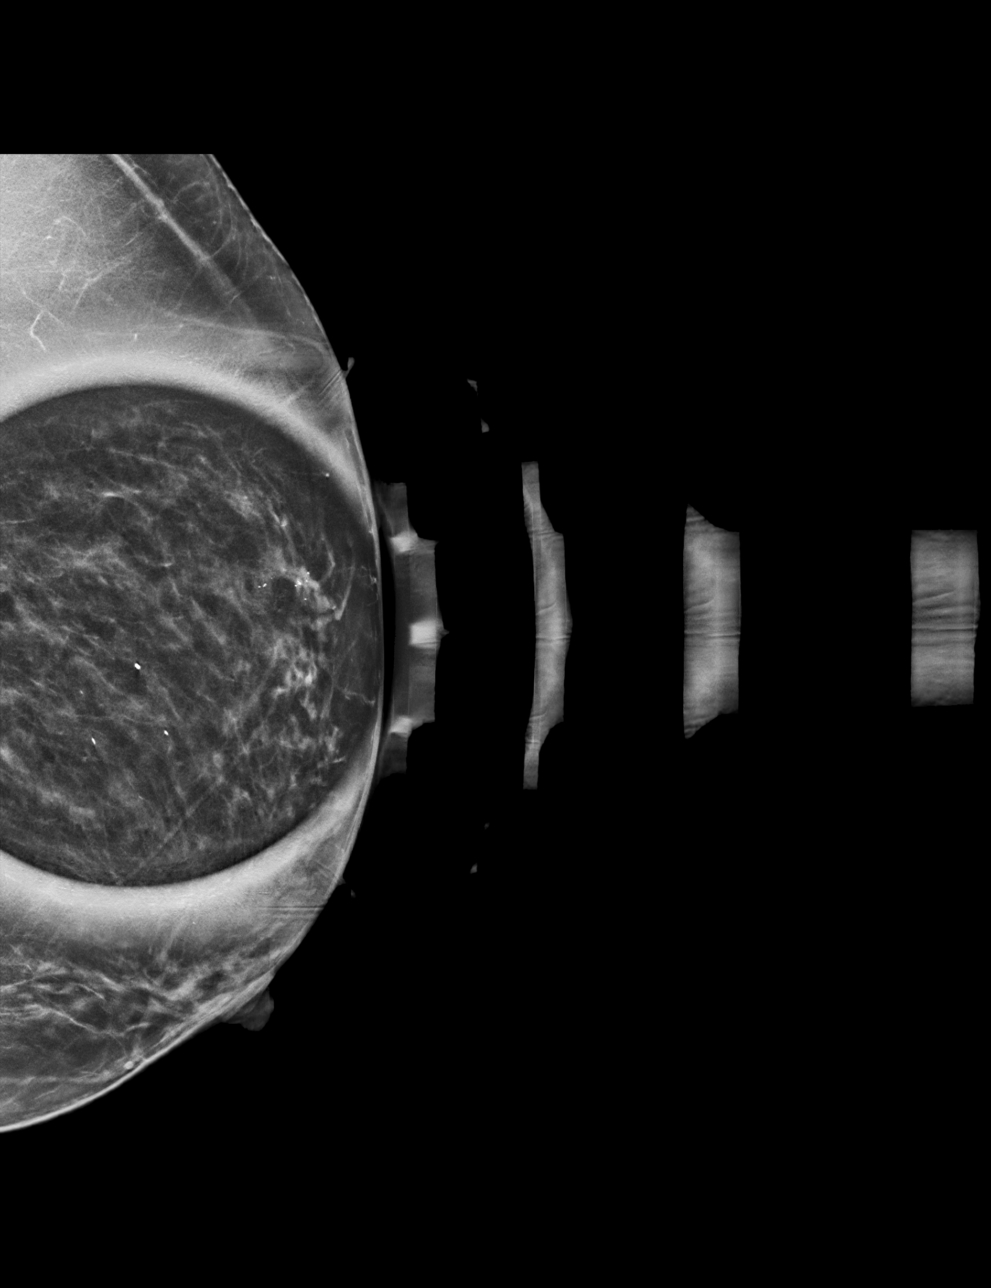

[L ML tomo · tomo slice 37/73.0]
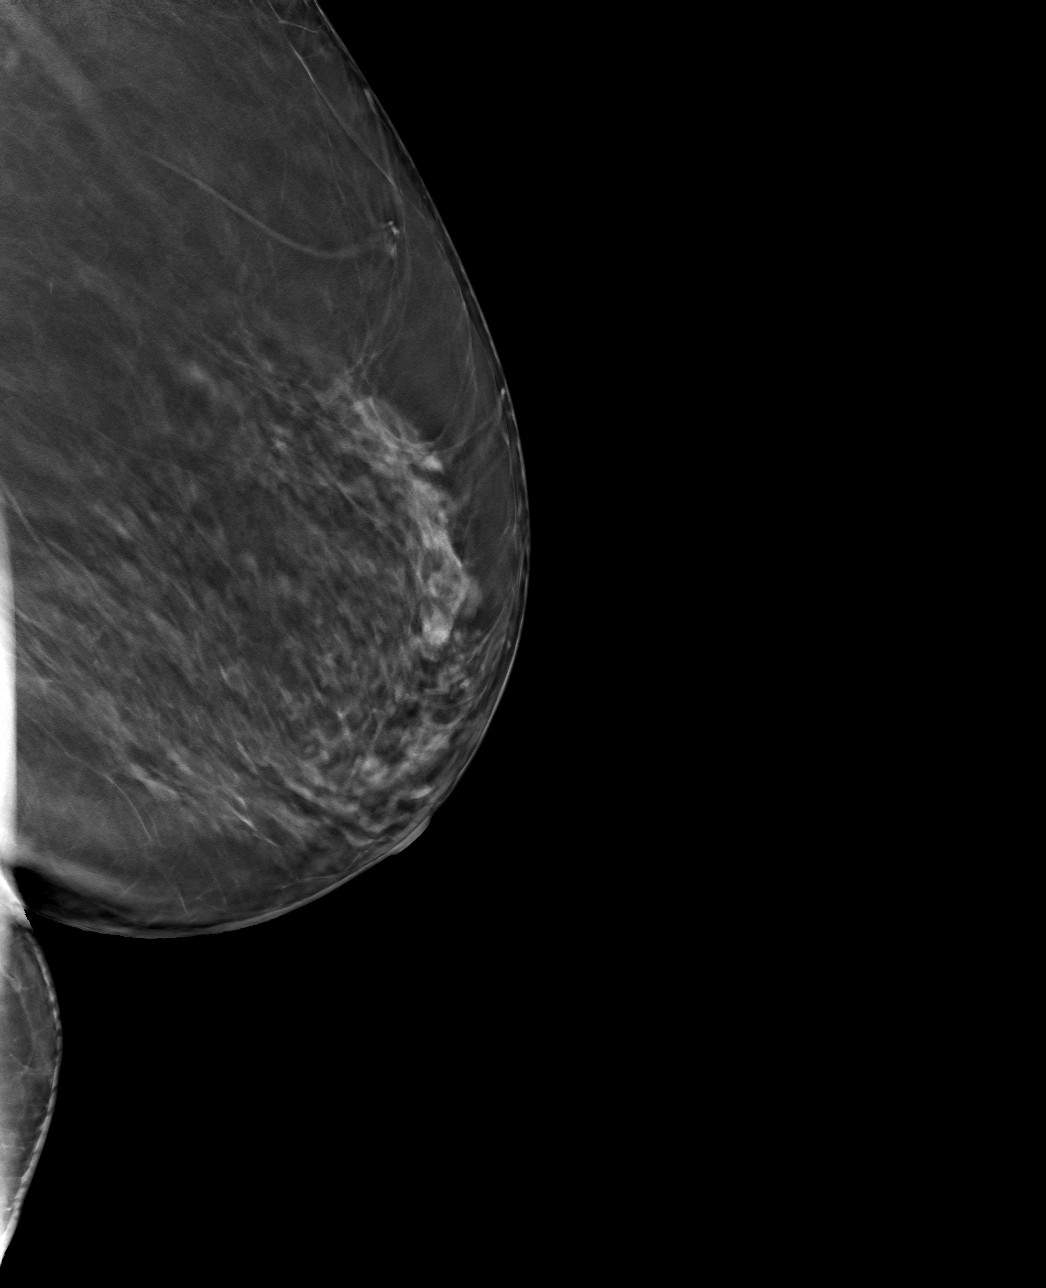

[L MLO tomo · tomo slice 29/56.0]
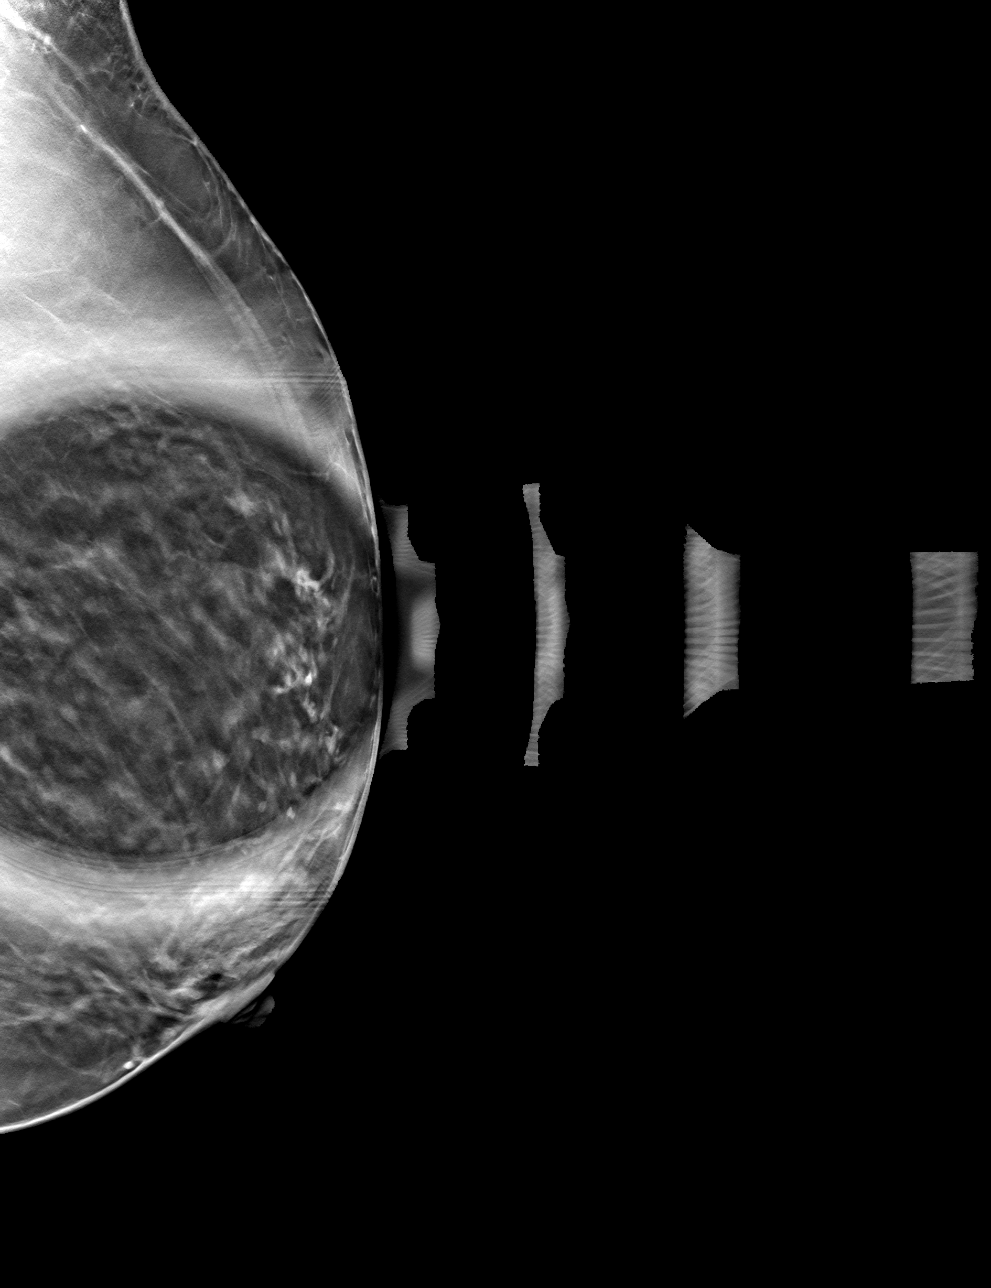

[4 of 12 positions shown; findings below may reference images not displayed]

ACR Breast Density Category b: There are scattered areas of
fibroglandular density.
FINDINGS: The previously noted possible asymmetry in the upper left breast
does not persist on additional views, consistent with overlapping
fibroglandular tissue.
IMPRESSION: No mammographic findings of malignancy.

RECOMMENDATION:
Annual screening mammogram in 1 year.

I have discussed the findings and recommendations with the patient.
If applicable, a reminder letter will be sent to the patient
regarding the next appointment.

BI-RADS CATEGORY  1: Negative.

## 2023-08-30 ENCOUNTER — Other Ambulatory Visit: Payer: Self-pay | Admitting: Obstetrics and Gynecology

## 2023-08-30 DIAGNOSIS — Z1231 Encounter for screening mammogram for malignant neoplasm of breast: Secondary | ICD-10-CM

## 2023-09-21 ENCOUNTER — Ambulatory Visit
Admission: RE | Admit: 2023-09-21 | Discharge: 2023-09-21 | Disposition: A | Discharge status: Home / Self Care | Source: Ambulatory Visit | Attending: Obstetrics and Gynecology | Admitting: Obstetrics and Gynecology

## 2023-09-21 DIAGNOSIS — Z1231 Encounter for screening mammogram for malignant neoplasm of breast: Secondary | ICD-10-CM

## 2023-11-16 ENCOUNTER — Encounter (HOSPITAL_COMMUNITY): Payer: Self-pay

## 2023-11-16 ENCOUNTER — Ambulatory Visit (HOSPITAL_COMMUNITY)
Admission: EM | Admit: 2023-11-16 | Discharge: 2023-11-16 | Disposition: A | Discharge status: Home / Self Care | Attending: Emergency Medicine | Admitting: Emergency Medicine

## 2023-11-16 ENCOUNTER — Ambulatory Visit (HOSPITAL_COMMUNITY): Payer: Self-pay | Admitting: Emergency Medicine

## 2023-11-16 DIAGNOSIS — R42 Dizziness and giddiness: Secondary | ICD-10-CM | POA: Diagnosis not present

## 2023-11-16 DIAGNOSIS — R030 Elevated blood-pressure reading, without diagnosis of hypertension: Secondary | ICD-10-CM | POA: Diagnosis present

## 2023-11-16 DIAGNOSIS — Z79899 Other long term (current) drug therapy: Secondary | ICD-10-CM | POA: Diagnosis present

## 2023-11-16 LAB — CBC WITH DIFFERENTIAL/PLATELET
Abs Immature Granulocytes: 0.02 K/uL (ref 0.00–0.07)
Basophils Absolute: 0 K/uL (ref 0.0–0.1)
Basophils Relative: 1 %
Eosinophils Absolute: 0.1 K/uL (ref 0.0–0.5)
Eosinophils Relative: 1 %
HCT: 41.6 % (ref 36.0–46.0)
Hemoglobin: 13.2 g/dL (ref 12.0–15.0)
Immature Granulocytes: 0 %
Lymphocytes Relative: 27 %
Lymphs Abs: 1.4 K/uL (ref 0.7–4.0)
MCH: 27.7 pg (ref 26.0–34.0)
MCHC: 31.7 g/dL (ref 30.0–36.0)
MCV: 87.2 fL (ref 80.0–100.0)
Monocytes Absolute: 0.7 K/uL (ref 0.1–1.0)
Monocytes Relative: 13 %
Neutro Abs: 3.2 K/uL (ref 1.7–7.7)
Neutrophils Relative %: 58 %
Platelets: 348 K/uL (ref 150–400)
RBC: 4.77 MIL/uL (ref 3.87–5.11)
RDW: 14.5 % (ref 11.5–15.5)
WBC: 5.4 K/uL (ref 4.0–10.5)
nRBC: 0 % (ref 0.0–0.2)

## 2023-11-16 LAB — POCT URINE DIPSTICK
Bilirubin, UA: NEGATIVE
Blood, UA: NEGATIVE
Glucose, UA: NEGATIVE mg/dL
Ketones, POC UA: NEGATIVE mg/dL
Nitrite, UA: NEGATIVE
POC PROTEIN,UA: 100 — AB
Spec Grav, UA: 1.02 (ref 1.010–1.025)
Urobilinogen, UA: 0.2 U/dL
pH, UA: 6 (ref 5.0–8.0)

## 2023-11-16 LAB — COMPREHENSIVE METABOLIC PANEL WITH GFR
ALT: 17 U/L (ref 0–44)
AST: 26 U/L (ref 15–41)
Albumin: 4.3 g/dL (ref 3.5–5.0)
Alkaline Phosphatase: 72 U/L (ref 38–126)
Anion gap: 16 — ABNORMAL HIGH (ref 5–15)
BUN: 18 mg/dL (ref 8–23)
CO2: 24 mmol/L (ref 22–32)
Calcium: 9.9 mg/dL (ref 8.9–10.3)
Chloride: 98 mmol/L (ref 98–111)
Creatinine, Ser: 1.12 mg/dL — ABNORMAL HIGH (ref 0.44–1.00)
GFR, Estimated: 49 mL/min — ABNORMAL LOW (ref >60)
Glucose, Bld: 131 mg/dL — ABNORMAL HIGH (ref 70–99)
Potassium: 4.2 mmol/L (ref 3.5–5.1)
Sodium: 138 mmol/L (ref 135–145)
Total Bilirubin: 0.9 mg/dL (ref 0.0–1.2)
Total Protein: 7.8 g/dL (ref 6.5–8.1)

## 2023-11-16 LAB — GLUCOSE, POCT (MANUAL RESULT ENTRY): POCT Glucose (KUC): 136 mg/dL — AB (ref 70–99)

## 2023-11-16 MED ORDER — MECLIZINE HCL 12.5 MG PO TABS: 12.5000 mg | ORAL_TABLET | Freq: Three times a day (TID) | ORAL | 0 refills | Status: AC | PRN

## 2023-11-16 NOTE — Discharge Instructions (Signed)
 Overall, your physical exam was reassuring.  We are checking your urine to make sure you do not have a urinary tract infection.  We are checking some basic labs and you will be contacted if urgent follow-up is needed.  I am suspicious of vertigo.  Please take position changes slowly and ensure you are drinking at least 64 ounces of water  daily.  You can try the meclizine, use this with caution as it can cause drowsiness and increase your risk of falls.  Seek immediate care at the nearest emergency department if you develop weakness, vision changes, slurred speech or facial drooping or any new concerning symptoms.  Follow-up with your primary care provider in the next week or 2 for reevaluation.

## 2023-11-16 NOTE — ED Provider Notes (Signed)
 [facility]    CSN: [id number] Arrival date & time: 11/16/23  9157      History   Chief Complaint Chief Complaint  Patient presents with   Dizziness    HPI Diane Wilkerson is a 83 y.o. female.   Patient presents to clinic with husband over concern of dizziness with position changes and intermittent headache for the past 3 days.  Blood pressure today was 173/73 and after she sat for a while and rested it was 138/78.  She takes amlodipine  and metoprolol .  Reports compliance.  Does not currently have a headache.  Feels like it is more sinus, intermittent and mild.  About position changes such as sitting to standing she feels very dizzy and will have to hold onto things to not fall over.  Has not fallen.  Denies weakness, slurred speech or vision changes.  Has been more stressed recently.  No diet changes.  Without recent illness.  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  History of type 2 diabetes, she is fasting today.  The history is provided by the patient and medical records.  Dizziness   Past Medical History:  Diagnosis Date   Complicated UTI (urinary tract infection) 05/07/2019   History of kidney stones    Hyperlipidemia    Hypertension    Pulmonary sarcoidosis    Sleep apnea    Uncontrolled type 2 diabetes mellitus with hyperglycemia, without long-term current use of insulin  (HCC) 05/06/2019    Patient Active Problem List   Diagnosis Date Noted   Left ureteral stone 05/07/2019   Hydronephrosis, left 05/07/2019   Folate deficiency 05/07/2019   Iron deficiency anemia 05/07/2019   Primary hypertension 05/06/2019   Uncontrolled type 2 diabetes mellitus with hyperglycemia, without long-term current use of insulin  (HCC) 05/06/2019   Adrenal adenoma, 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    Past Surgical History:  Procedure Laterality Date   BREAST EXCISIONAL BIOPSY Left    CYSTOSCOPY W/ URETERAL STENT PLACEMENT Left 04/24/2020   Procedure: CYSTOSCOPY WITH  RETROGRADE PYELOGRAM/URETERAL STENT PLACEMENT;  Surgeon: Selma Donnice SAUNDERS, MD;  Location: [facility];  Service: Urology;  Laterality: Left;   CYSTOSCOPY WITH STENT PLACEMENT Left 05/06/2019   Procedure: Cystoscopy with retrograde pyleogram and left stent placement, basket removal of stone foley placement;  Surgeon: Nieves Donnice, MD;  Location: [facility];  Service: Urology;  Laterality: Left;   CYSTOSCOPY/URETEROSCOPY/HOLMIUM LASER/STENT PLACEMENT Left 05/13/2020   Procedure: CYSTOSCOPY/URETEROSCOPY/HOLMIUM LASER/STENT EXCHANGE;  Surgeon: Elisabeth Valli BIRCH, MD;  Location: [facility];  Service: Urology;  Laterality: Left;   TONSILLECTOMY      OB History   No obstetric history on file.      Home Medications    Prior to Admission medications   Medication Sig Start Date End Date Taking? Authorizing Provider  meclizine (ANTIVERT) 12.5 MG tablet Take 1 tablet (12.5 mg total) by mouth 3 (three) times daily as needed for dizziness. 11/16/23  Yes Dreama, Lugene Beougher  N, FNP  acetaminophen  (TYLENOL ) 325 MG tablet Take 2 tablets (650 mg total) by mouth every 6 (six) hours as needed for mild pain, fever or headache. 04/27/20   Danton Reyes DASEN, MD  alum & mag hydroxide-simeth (MYLANTA MAXIMUM STRENGTH) 400-400-40 MG/5ML suspension Take 15 mLs by mouth every 6 (six) hours as needed for indigestion. 08/11/22   Jerral Meth, MD  amLODipine  (NORVASC ) 10 MG tablet Take 10 mg by mouth daily. 04/23/20   [provider]  atorvastatin  (LIPITOR) 10 MG tablet Take 1 tablet (10 mg total)  by mouth every evening. 06/22/22   Ladona Heinz, MD  famotidine  (PEPCID ) 20 MG tablet Take 1 tablet (20 mg total) by mouth 3 (three) times daily as needed for heartburn or indigestion. 08/11/22   Jerral Meth, MD  Finerenone  (KERENDIA ) 10 MG TABS Take 1 tablet (10 mg total) by mouth daily. 02/16/22   Ladona Heinz, MD  glipizide-metformin (METAGLIP) 2.5-250 MG tablet Take 1 tablet by mouth 2 (two) times daily. 04/21/19   [provider]  losartan -hydrochlorothiazide  (HYZAAR) 50-12.5 MG tablet TAKE 1 TABLET BY MOUTH EVERY DAY IN THE MORNING 04/05/22   Ladona Heinz, MD  metoprolol  succinate (TOPROL  XL) 50 MG 24 hr tablet Take 1 tablet (50 mg total) by mouth daily. Take with or immediately following a meal. 06/04/22   Ladona Heinz, MD  pantoprazole  (PROTONIX ) 20 MG tablet Take 1 tablet (20 mg total) by mouth daily. 08/11/22   Jerral Meth, MD    Family History Family History  Problem Relation Age of Onset   Pancreatic cancer Mother    Heart disease Father    Breast cancer Sister    Cancer Sister    Diabetes Sister    Cancer Brother     Social History Social History   Tobacco Use   Smoking status: Never   Smokeless tobacco: Never  Vaping Use   Vaping status: Never Used  Substance Use Topics   Alcohol use: Not Currently   Drug use: Never     Allergies   Patient has no known allergies.   Review of Systems Review of Systems  Per HPI  Physical Exam Triage Vital Signs ED Triage Vitals  Encounter Vitals Group     BP 11/16/23 0930 (!) 164/83     Girls Systolic BP Percentile --      Girls Diastolic BP Percentile --      Boys Systolic BP Percentile --      Boys Diastolic BP Percentile --      Pulse Rate 11/16/23 0930 96     Resp 11/16/23 0930 18     Temp 11/16/23 0930 98.3 F (36.8 C)     Temp Source 11/16/23 0930 Oral     SpO2 11/16/23 0930 97 %     Weight --      Height --      Head Circumference --      Peak Flow --      Pain Score 11/16/23 0927 0     Pain Loc --      Pain Education --      Exclude from Growth Chart --    Orthostatic VS for the past 24 hrs:  BP- Lying Pulse- Lying BP- Sitting Pulse- Sitting BP- Standing at 0 minutes Pulse- Standing at 0 minutes  11/16/23 1027 146/85 95 157/78 94 161/85 101    Updated Vital Signs BP (!) 164/83 (BP Location: Left Arm)   Pulse 96   Temp 98.3 F (36.8 C) (Oral)   Resp 18   LMP  (LMP Unknown) Comment: postmenopausal  SpO2 97%    Visual Acuity Right Eye Distance:   Left Eye Distance:   Bilateral Distance:    Right Eye Near:   Left Eye Near:    Bilateral Near:     Physical Exam Vitals and nursing note reviewed.  Constitutional:      Appearance: Normal appearance.  HENT:     Head: Normocephalic and atraumatic.     Right Ear: External ear normal.     Left  Ear: External ear normal.     Nose: Nose normal.     Mouth/Throat:     Mouth: Mucous membranes are moist.  Eyes:     Conjunctiva/sclera: Conjunctivae normal.  Cardiovascular:     Rate and Rhythm: Normal rate.  Pulmonary:     Effort: Pulmonary effort is normal. No respiratory distress.  Musculoskeletal:        General: Normal range of motion.  Skin:    General: Skin is warm and dry.  Neurological:     General: No focal deficit present.     Mental Status: She is alert and oriented to person, place, and time.     GCS: GCS eye subscore is 4. GCS verbal subscore is 5. GCS motor subscore is 6.     Cranial Nerves: Cranial nerves 2-12 are intact. No cranial nerve deficit, dysarthria or facial asymmetry.     Sensory: Sensation is intact.     Motor: Motor function is intact. No weakness.     Gait: Gait normal.  Psychiatric:        Mood and Affect: Mood normal.        Behavior: Behavior normal.      UC Treatments / Results  Labs (all labs ordered are listed, but only abnormal results are displayed) Labs Reviewed  GLUCOSE, POCT (MANUAL RESULT ENTRY) - Abnormal; Notable for the following components:      Result Value   POCT Glucose ([facility]) 136 (*)    All other components within normal limits  POCT URINE DIPSTICK - Abnormal; Notable for the following components:   POC PROTEIN,UA =100 (*)    Leukocytes, UA Large (3+) (*)    All other components within normal limits  URINE CULTURE  CBC WITH DIFFERENTIAL/PLATELET  COMPREHENSIVE METABOLIC PANEL WITH GFR    EKG   Radiology No results found.  Procedures Procedures (including critical care  time)  Medications Ordered in UC Medications - No data to display  Initial Impression / Assessment and Plan / UC Course  I have reviewed the triage vital signs and the nursing notes.  Pertinent labs & imaging results that were available during my care of the patient were reviewed by me and considered in my medical decision making (see chart for details).  Vitals and triage reviewed, patient is hemodynamically stable.  Lungs vesicular, heart with regular rate and rhythm.  EKG by my interpretation shows normal sinus rhythm at a rate of 94 bpm, without ST elevation or ST depression.  CBG 136, fasting.  UA does show large leukocytes, asymptomatic, will send for culture due to being elderly with vague symptoms.  Orthostatics do not show orthostatic hypotension.  Cranial nerves II through XII grossly intact.  Neuroexam unremarkable.  Discussed potential for CVA versus neurologic abnormality and limitations of urgent care with advanced imaging.  Patient and husband verbalized with strict understanding of emergency follow-up if symptoms evolve or worsen.  Suspect vertigo, will trial low-dose of meclizine, educated on risk of side effects and falls.  Checking CBC and CMP, will contact if urgent follow-up is needed.  Plan of care, follow-up care return precautions given, no questions at this time.    Final Clinical Impressions(s) / UC Diagnoses   Final diagnoses:  Dizziness     Discharge Instructions      Overall, your physical exam was reassuring.  We are checking your urine to make sure you do not have a urinary tract infection.  We are checking some basic labs and you will  be contacted if urgent follow-up is needed.  I am suspicious of vertigo.  Please take position changes slowly and ensure you are drinking at least 64 ounces of water  daily.  You can try the meclizine, use this with caution as it can cause drowsiness and increase your risk of falls.  Seek immediate care at the  nearest emergency department if you develop weakness, vision changes, slurred speech or facial drooping or any new concerning symptoms.  Follow-up with your primary care provider in the next week or 2 for reevaluation.      ED Prescriptions     Medication Sig Dispense Auth. Provider   meclizine (ANTIVERT) 12.5 MG tablet Take 1 tablet (12.5 mg total) by mouth 3 (three) times daily as needed for dizziness. 30 tablet Dreama, Lilyauna Miedema  N, FNP      PDMP not reviewed this encounter.   Dreama Candus Braud  N, FNP 11/16/23 1102

## 2023-11-16 NOTE — ED Triage Notes (Signed)
 Pt c/o dizziness and slight headache x3 days. States her b/p today was 173/73. States took her meds this morning.

## 2023-11-17 LAB — URINE CULTURE

## 2024-02-01 ENCOUNTER — Other Ambulatory Visit: Payer: Self-pay | Admitting: Internal Medicine

## 2024-02-01 DIAGNOSIS — R1031 Right lower quadrant pain: Secondary | ICD-10-CM

## 2024-02-10 ENCOUNTER — Other Ambulatory Visit: Payer: Self-pay | Admitting: Internal Medicine

## 2024-02-10 ENCOUNTER — Ambulatory Visit: Admission: RE | Admit: 2024-02-10 | Source: Ambulatory Visit

## 2024-02-10 DIAGNOSIS — R1031 Right lower quadrant pain: Secondary | ICD-10-CM
